# Patient Record
Sex: Male | Born: 1940 | Race: White | Hispanic: No | Marital: Married | Smoking: Current every day smoker
Health system: Southern US, Community
[De-identification: ages and names within clinical notes are randomized; demographics above are authoritative.]

## PROBLEM LIST (undated history)

## (undated) DIAGNOSIS — G459 Transient cerebral ischemic attack, unspecified: Secondary | ICD-10-CM

## (undated) DIAGNOSIS — G709 Myoneural disorder, unspecified: Secondary | ICD-10-CM

## (undated) DIAGNOSIS — K922 Gastrointestinal hemorrhage, unspecified: Secondary | ICD-10-CM

## (undated) DIAGNOSIS — R12 Heartburn: Secondary | ICD-10-CM

## (undated) DIAGNOSIS — F329 Major depressive disorder, single episode, unspecified: Secondary | ICD-10-CM

## (undated) DIAGNOSIS — J45909 Unspecified asthma, uncomplicated: Secondary | ICD-10-CM

## (undated) DIAGNOSIS — R51 Headache: Secondary | ICD-10-CM

## (undated) DIAGNOSIS — R519 Headache, unspecified: Secondary | ICD-10-CM

## (undated) DIAGNOSIS — F32A Depression, unspecified: Secondary | ICD-10-CM

## (undated) DIAGNOSIS — F418 Other specified anxiety disorders: Secondary | ICD-10-CM

## (undated) DIAGNOSIS — K8689 Other specified diseases of pancreas: Secondary | ICD-10-CM

## (undated) DIAGNOSIS — I10 Essential (primary) hypertension: Secondary | ICD-10-CM

## (undated) DIAGNOSIS — J189 Pneumonia, unspecified organism: Secondary | ICD-10-CM

## (undated) DIAGNOSIS — C801 Malignant (primary) neoplasm, unspecified: Secondary | ICD-10-CM

## (undated) DIAGNOSIS — T8859XA Other complications of anesthesia, initial encounter: Secondary | ICD-10-CM

## (undated) DIAGNOSIS — T4145XA Adverse effect of unspecified anesthetic, initial encounter: Secondary | ICD-10-CM

## (undated) DIAGNOSIS — F1010 Alcohol abuse, uncomplicated: Secondary | ICD-10-CM

## (undated) DIAGNOSIS — Z720 Tobacco use: Secondary | ICD-10-CM

## (undated) DIAGNOSIS — K219 Gastro-esophageal reflux disease without esophagitis: Secondary | ICD-10-CM

## (undated) HISTORY — DX: Depression, unspecified: F32.A

## (undated) HISTORY — DX: Major depressive disorder, single episode, unspecified: F32.9

## (undated) HISTORY — PX: COLONOSCOPY: SHX174

## (undated) HISTORY — PX: TONSILLECTOMY: SUR1361

## (undated) HISTORY — PX: CATARACT EXTRACTION: SUR2

## (undated) HISTORY — PX: MULTIPLE TOOTH EXTRACTIONS: SHX2053

## (undated) HISTORY — PX: OTHER SURGICAL HISTORY: SHX169

---

## 1997-08-12 ENCOUNTER — Encounter (HOSPITAL_COMMUNITY): Admission: RE | Admit: 1997-08-12 | Discharge: 1997-11-10 | Payer: Self-pay | Admitting: Psychiatry

## 1997-09-13 ENCOUNTER — Ambulatory Visit (HOSPITAL_COMMUNITY): Admission: RE | Admit: 1997-09-13 | Discharge: 1997-09-13 | Payer: Self-pay | Admitting: Family Medicine

## 1998-02-05 ENCOUNTER — Emergency Department (HOSPITAL_COMMUNITY): Admission: EM | Admit: 1998-02-05 | Discharge: 1998-02-05 | Payer: Self-pay | Admitting: Emergency Medicine

## 1998-02-07 ENCOUNTER — Encounter: Payer: Self-pay | Admitting: Family Medicine

## 1998-02-07 ENCOUNTER — Ambulatory Visit (HOSPITAL_COMMUNITY): Admission: RE | Admit: 1998-02-07 | Discharge: 1998-02-07 | Payer: Self-pay | Admitting: Family Medicine

## 1998-10-11 ENCOUNTER — Ambulatory Visit (HOSPITAL_COMMUNITY): Admission: RE | Admit: 1998-10-11 | Discharge: 1998-10-11 | Payer: Self-pay | Admitting: Gastroenterology

## 1999-01-26 ENCOUNTER — Ambulatory Visit (HOSPITAL_COMMUNITY): Admission: RE | Admit: 1999-01-26 | Discharge: 1999-01-26 | Payer: Self-pay | Admitting: Gastroenterology

## 1999-09-07 ENCOUNTER — Encounter (INDEPENDENT_AMBULATORY_CARE_PROVIDER_SITE_OTHER): Payer: Self-pay | Admitting: *Deleted

## 1999-09-07 ENCOUNTER — Ambulatory Visit (HOSPITAL_COMMUNITY): Admission: RE | Admit: 1999-09-07 | Discharge: 1999-09-07 | Payer: Self-pay | Admitting: Gastroenterology

## 2000-10-22 ENCOUNTER — Encounter (INDEPENDENT_AMBULATORY_CARE_PROVIDER_SITE_OTHER): Payer: Self-pay | Admitting: Specialist

## 2000-10-22 ENCOUNTER — Ambulatory Visit (HOSPITAL_COMMUNITY): Admission: RE | Admit: 2000-10-22 | Discharge: 2000-10-22 | Payer: Self-pay | Admitting: Gastroenterology

## 2001-09-21 ENCOUNTER — Encounter (INDEPENDENT_AMBULATORY_CARE_PROVIDER_SITE_OTHER): Payer: Self-pay | Admitting: Specialist

## 2001-09-21 ENCOUNTER — Ambulatory Visit (HOSPITAL_COMMUNITY): Admission: RE | Admit: 2001-09-21 | Discharge: 2001-09-21 | Payer: Self-pay | Admitting: Gastroenterology

## 2003-05-26 ENCOUNTER — Encounter (INDEPENDENT_AMBULATORY_CARE_PROVIDER_SITE_OTHER): Payer: Self-pay | Admitting: *Deleted

## 2003-05-26 ENCOUNTER — Ambulatory Visit (HOSPITAL_COMMUNITY): Admission: RE | Admit: 2003-05-26 | Discharge: 2003-05-26 | Payer: Self-pay | Admitting: Gastroenterology

## 2004-12-21 ENCOUNTER — Ambulatory Visit: Payer: Self-pay | Admitting: Family Medicine

## 2004-12-25 ENCOUNTER — Ambulatory Visit: Payer: Self-pay | Admitting: *Deleted

## 2004-12-27 ENCOUNTER — Ambulatory Visit: Payer: Self-pay | Admitting: Family Medicine

## 2005-03-26 ENCOUNTER — Ambulatory Visit: Payer: Self-pay | Admitting: Family Medicine

## 2005-05-24 ENCOUNTER — Ambulatory Visit: Payer: Self-pay | Admitting: Family Medicine

## 2005-06-24 ENCOUNTER — Ambulatory Visit: Payer: Self-pay | Admitting: Family Medicine

## 2005-07-24 ENCOUNTER — Ambulatory Visit: Payer: Self-pay | Admitting: Family Medicine

## 2008-01-09 ENCOUNTER — Observation Stay (HOSPITAL_COMMUNITY): Admission: EM | Admit: 2008-01-09 | Discharge: 2008-01-11 | Payer: Self-pay | Admitting: Emergency Medicine

## 2008-01-10 ENCOUNTER — Encounter (INDEPENDENT_AMBULATORY_CARE_PROVIDER_SITE_OTHER): Payer: Self-pay | Admitting: Internal Medicine

## 2008-01-10 ENCOUNTER — Ambulatory Visit: Payer: Self-pay | Admitting: Surgery

## 2008-03-03 ENCOUNTER — Inpatient Hospital Stay (HOSPITAL_COMMUNITY): Admission: EM | Admit: 2008-03-03 | Discharge: 2008-03-06 | Payer: Self-pay | Admitting: Emergency Medicine

## 2008-03-03 ENCOUNTER — Ambulatory Visit: Payer: Self-pay | Admitting: Internal Medicine

## 2008-03-04 ENCOUNTER — Encounter (INDEPENDENT_AMBULATORY_CARE_PROVIDER_SITE_OTHER): Payer: Self-pay | Admitting: Internal Medicine

## 2008-03-04 ENCOUNTER — Ambulatory Visit: Payer: Self-pay | Admitting: Vascular Surgery

## 2008-04-02 ENCOUNTER — Inpatient Hospital Stay (HOSPITAL_COMMUNITY): Admission: EM | Admit: 2008-04-02 | Discharge: 2008-04-07 | Payer: Self-pay | Admitting: Emergency Medicine

## 2008-05-20 ENCOUNTER — Encounter: Admission: RE | Admit: 2008-05-20 | Discharge: 2008-05-20 | Payer: Self-pay | Admitting: Family Medicine

## 2008-11-21 ENCOUNTER — Inpatient Hospital Stay (HOSPITAL_COMMUNITY): Admission: EM | Admit: 2008-11-21 | Discharge: 2008-11-22 | Payer: Self-pay | Admitting: Emergency Medicine

## 2009-02-11 ENCOUNTER — Emergency Department (HOSPITAL_COMMUNITY): Admission: EM | Admit: 2009-02-11 | Discharge: 2009-02-12 | Payer: Self-pay | Admitting: Emergency Medicine

## 2009-02-12 ENCOUNTER — Inpatient Hospital Stay (HOSPITAL_COMMUNITY): Admission: RE | Admit: 2009-02-12 | Discharge: 2009-02-17 | Payer: Self-pay | Admitting: Psychiatry

## 2009-02-12 ENCOUNTER — Ambulatory Visit: Payer: Self-pay | Admitting: Psychiatry

## 2010-02-28 ENCOUNTER — Encounter: Admission: RE | Admit: 2010-02-28 | Discharge: 2010-02-28 | Payer: Self-pay | Admitting: Neurology

## 2010-08-16 LAB — URINALYSIS, ROUTINE W REFLEX MICROSCOPIC
Bilirubin Urine: NEGATIVE
Glucose, UA: NEGATIVE mg/dL
Hgb urine dipstick: NEGATIVE
Ketones, ur: NEGATIVE mg/dL
Protein, ur: NEGATIVE mg/dL
Urobilinogen, UA: 0.2 mg/dL (ref 0.0–1.0)
pH: 6 (ref 5.0–8.0)

## 2010-08-16 LAB — RAPID URINE DRUG SCREEN, HOSP PERFORMED
Amphetamines: NOT DETECTED
Barbiturates: NOT DETECTED
Benzodiazepines: POSITIVE — AB
Cocaine: NOT DETECTED
Opiates: NOT DETECTED
Tetrahydrocannabinol: NOT DETECTED

## 2010-08-16 LAB — DIFFERENTIAL
Basophils Absolute: 0.1 10*3/uL (ref 0.0–0.1)
Basophils Relative: 1 % (ref 0–1)
Lymphocytes Relative: 11 % — ABNORMAL LOW (ref 12–46)
Monocytes Absolute: 0.9 10*3/uL (ref 0.1–1.0)
Monocytes Relative: 6 % (ref 3–12)
Neutro Abs: 12 10*3/uL — ABNORMAL HIGH (ref 1.7–7.7)

## 2010-08-16 LAB — CBC
MCV: 94.3 fL (ref 78.0–100.0)
Platelets: 280 10*3/uL (ref 150–400)
RDW: 14.1 % (ref 11.5–15.5)

## 2010-08-16 LAB — BASIC METABOLIC PANEL
Calcium: 9.1 mg/dL (ref 8.4–10.5)
Chloride: 104 mEq/L (ref 96–112)
GFR calc Af Amer: >60 mL/min (ref >60)

## 2010-08-19 LAB — POCT I-STAT 3, ART BLOOD GAS (G3+)
Bicarbonate: 25.5 mEq/L — ABNORMAL HIGH (ref 20.0–24.0)
pCO2 arterial: 42.7 mmHg (ref 35.0–45.0)
pH, Arterial: 7.38 (ref 7.350–7.450)
pO2, Arterial: 84 mmHg (ref 80.0–100.0)

## 2010-08-19 LAB — DIFFERENTIAL
Basophils Relative: 1 % (ref 0–1)
Eosinophils Absolute: 0.2 10*3/uL (ref 0.0–0.7)
Eosinophils Absolute: 0.3 10*3/uL (ref 0.0–0.7)
Eosinophils Relative: 3 % (ref 0–5)
Eosinophils Relative: 3 % (ref 0–5)
Lymphocytes Relative: 23 % (ref 12–46)
Lymphocytes Relative: 24 % (ref 12–46)
Lymphs Abs: 1.8 10*3/uL (ref 0.7–4.0)
Monocytes Absolute: 0.7 10*3/uL (ref 0.1–1.0)
Monocytes Relative: 8 % (ref 3–12)
Monocytes Relative: 9 % (ref 3–12)
Neutro Abs: 5.9 10*3/uL (ref 1.7–7.7)

## 2010-08-19 LAB — CBC
HCT: 37 % — ABNORMAL LOW (ref 39.0–52.0)
HCT: 42.8 % (ref 39.0–52.0)
MCHC: 34 g/dL (ref 30.0–36.0)
MCV: 94.3 fL (ref 78.0–100.0)
MCV: 94.7 fL (ref 78.0–100.0)
Platelets: 181 10*3/uL (ref 150–400)
RBC: 3.91 MIL/uL — ABNORMAL LOW (ref 4.22–5.81)
RBC: 4.54 MIL/uL (ref 4.22–5.81)
WBC: 7.3 10*3/uL (ref 4.0–10.5)
WBC: 8.9 10*3/uL (ref 4.0–10.5)

## 2010-08-19 LAB — COMPREHENSIVE METABOLIC PANEL
AST: 12 U/L (ref 0–37)
AST: 14 U/L (ref 0–37)
BUN: 21 mg/dL (ref 6–23)
BUN: 22 mg/dL (ref 6–23)
CO2: 27 mEq/L (ref 19–32)
Calcium: 8.5 mg/dL (ref 8.4–10.5)
Chloride: 106 mEq/L (ref 96–112)
Chloride: 106 mEq/L (ref 96–112)
Creatinine, Ser: 1.08 mg/dL (ref 0.4–1.5)
Creatinine, Ser: 1.09 mg/dL (ref 0.4–1.5)
GFR calc Af Amer: >60 mL/min (ref >60)
GFR calc Af Amer: >60 mL/min (ref >60)
GFR calc non Af Amer: >60 mL/min (ref >60)
GFR calc non Af Amer: >60 mL/min (ref >60)
Glucose, Bld: 80 mg/dL (ref 70–99)
Potassium: 3.7 mEq/L (ref 3.5–5.1)
Total Bilirubin: 0.6 mg/dL (ref 0.3–1.2)

## 2010-08-19 LAB — URINALYSIS, ROUTINE W REFLEX MICROSCOPIC
Glucose, UA: NEGATIVE mg/dL
Nitrite: NEGATIVE
Specific Gravity, Urine: 1.027 (ref 1.005–1.030)
pH: 5 (ref 5.0–8.0)

## 2010-08-19 LAB — SALICYLATE LEVEL: Salicylate Lvl: <4 mg/dL (ref 2.8–20.0)

## 2010-08-19 LAB — RAPID URINE DRUG SCREEN, HOSP PERFORMED: Cocaine: NOT DETECTED

## 2010-08-19 LAB — LIPID PANEL
HDL: 34 mg/dL — ABNORMAL LOW (ref >39)
Total CHOL/HDL Ratio: 3.4 RATIO
Triglycerides: 108 mg/dL (ref <150)
VLDL: 22 mg/dL (ref 0–40)

## 2010-09-25 NOTE — H&P (Signed)
NAME:  Reginald Ochoa, Reginald Ochoa NO.:  [id number]   MEDICAL RECORD NO.:  [id number]          PATIENT TYPE:  INP   LOCATION:  4705                         FACILITY:  [facility]   PHYSICIAN:  Oswald Hillock, MD        DATE OF BIRTH:  [birth year]   DATE OF ADMISSION:  11/21/2008  DATE OF DISCHARGE:                              HISTORY & PHYSICAL   CHIEF COMPLAINT:  Altered mental status.   HISTORY OF PRESENT ILLNESS:  The patient is a 71 year old Caucasian male  with history of CVA and residual visual deficits who presents to the  emergency room with complaints of altered mental status.  Apparently,  family noticed him to be altered this afternoon.  He was on the floor,  had slurring of speech, and clapped several times.  Apparently, the  patient has been taking hydrocodone or OxyContin, not prescribed to him  and provided to him by one of his friends.  The patient's family states  that he has been more somnolent than usual over the last few days.  No  reports of any recent cough, fever, rigors, chills, chest pain,  shortness of breath, palpitations, loss of consciousness, or any focal  weakness of any part of the body.  No abdominal pain, diarrhea, or  urinary symptoms reported either.   PAST MEDICAL HISTORY:  Significant for:  1. CVA with residual visual deficits.  2. History of acute renal failure.  3. History of hypertension.  4. History of hypokalemia.  5. History of rhabdomyolysis.  6. History of peptic ulcer disease.  7. History of ADHD.   SOCIAL HISTORY:  The patient lives with family.  He has a history of  heavy alcohol abuse according to previous discharge summaries.  The  patient continues to drink though not that frequently and smokes  cigarettes as well.  He does need help with some of his ADLs, but family  reports that he continues to maintain high level of functioning  including writing though he does get easily frustrated because of his  visual deficits.   FAMILY HISTORY:  No hypertension, coronary artery disease, or diabetes  mellitus in the family.   ALLERGIES:  No known drug allergies.   CURRENT MEDICATIONS:  Per the discharge summary of April 06, 2008, he  should be on:  1. Aggrenox 1 tablet p.o. b.i.d.  2. Adderall 10 mg daily.  3. Lisinopril 20 mg daily.  4. Toprol-XL 25 mg daily.  5. Prilosec daily.  Family reports that he is currently taking Xanax and Prozac as well,  dosage unknown.   REVIEW OF SYSTEMS:  An extensive review of systems was done.  All  systems are negative except for the positives mentioned in the history  of present illness.   PHYSICAL EXAMINATION:  VITAL SIGNS:  On admission, pulse 101, blood  pressure 117/79, respiratory rate of 14, and temperature 97.1.  GENERAL:  The patient is conscious, alert and oriented to person, place,  and time, but does get confused when asked specific questions.  He does  have some slurring of speech but on examination he does  have a very dry  tongue.  He has right eye strabismus with significant visual deficits in  both eyes.  HEENT:  No scleral icterus.  No pallor.  Ears negative.  Mucous  membranes are dry.  No significant oropharyngeal lesions, otherwise.  NECK:  Supple.  No lymphadenopathy.  No JVD.  No carotid bruit.  CHEST:  Breath sounds heard bilaterally.  Fair air entry.  Occasional  rhonchi.  CVS: S1 and S2 plus regular.  No gallop or rub.  ABDOMEN:  Soft and nontender.  Bowel sounds are present.  EXTREMITIES:  No cyanosis, clubbing, or edema.  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  No other  cranial nerve palsy noted on gross examination.  Motor examination;  power grade 5/5 all over.  Reflexes are present.  Plantars are  bilaterally downgoing.  No sensory deficits noted.  Gait; the patient  does have an unsteady gait and difficulty performing finger-nose and  heel-to-shin testing, more likely because of his visual deficits than  any  possible cerebellar deficits.   LABORATORY DATA:  1. His EKG showed normal sinus rhythm, normal PR interval, right axis      deviation.  No significant changes compared with previous EKGs.  2. ABG showed a pH of 7.38, PCO2 of 42.7, and PO2 of 84.  3. CT head showed no acute intracranial abnormality and stable      bilateral occipital pole encephalomalacia worse on the right,      stable grey matter differentiation throughout the brain.  Urine      drug screen was positive for opiates, benzodiazepines, and      amphetamines.  Salicylate was less than 4.  Sodium 138, potassium      3.7, chloride 106, CO2 of 27, glucose 80, BUN 21, creatinine 1.08,      and calcium of 8.6.  Tylenol level was less than 10.  Urinalysis      was negative.  4. His WBC count was 7.3, hemoglobin 14.5, hematocrit 42.8, and      platelet count of 208.   IMPRESSION AND PLAN:  This is a case of a 70 year old Caucasian male  with history of multiple comorbidities including cerebrovascular  accident, hypertension, and attention deficit hyperactivity disorder who  presents with altered mental status.  1. Altered mental status.  Etiology essentially unclear though it is      very likely his narcotic use.  As the patient has significant      deficits in his vision, it is very difficult for him to take the      exact dosage of the medication.  As family suspects, he may have      been taking multiple doses of OxyContin/hydrocodone that is      provided to him by his friend as also the Xanax medication that he      takes for anxiety.  Initial eval including blood gas have been      within normal limits.  No evidence of any infectious process with a      normal white count and clear chest x-ray and urinalysis.  We will      admit him to the telemetry floor for overnight observation and      monitor neuro status closely.  If there are any changes or no      improvement in the next 12-24 hours, we may need to do an MRI to       evaluate him further though  as mentioned above his CT scan is      essentially negative.  He did have a similar episode about 7 months      back and was noted to have pneumonia at that time.  2. Hypertension.  Continue current medications.  3. History of cerebrovascular accident.  We will continue Aggrenox      twice daily.  4. History of peptic ulcer disease.  We will start him on Protonix      daily.  5. Attention deficit hyperactivity disorder.  Continue Adderall 10 mg      daily.  6. History of alcohol abuse with his last drink more than 24 hours      back.  We will put him on Ativan for delirium tremens prophylaxis.      Start thiamine and folate.  7. Unsteady gait, though this has been a chronic issue since his      stroke.  We will get PT/OT eval and follow up with their      recommendations.  8. Deep vein thrombosis/seizure prophylaxis.  Protonix and subcu      heparin.      Oswald Hillock, MD  Electronically Signed     BA/MEDQ  D:  11/21/2008  T:  11/22/2008  Job:  [id number]   cc:   Renaye Rakers, M.D.
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
## 2010-09-25 NOTE — H&P (Signed)
NAME:  Reginald Ochoa, NETTLE NO.:  [id number]   MEDICAL RECORD NO.:  [id number]          PATIENT TYPE:  INP   LOCATION:  5528                         FACILITY:  [facility]   PHYSICIAN:  Joylene John, MD       DATE OF BIRTH:  [birth year]   DATE OF ADMISSION:  04/02/2008  DATE OF DISCHARGE:                              HISTORY & PHYSICAL   REASON FOR ADMISSION:  Confusion and altered mental status.   HISTORY OF PRESENT ILLNESS:  This is a 70 year old white male who was  recently admitted in October for stroke.  The patient was found to have  altered mental status, problems with his speech, and was found to be  stumbling around.  The patient's family thought that he was having a TIA  given the fact that he was recently in the hospital for a stroke.  The  patient was brought into the ER for further evaluation and was found to  have a pneumonia on chest x-ray.  The patient tells me that overall he  feels fine.  He denies any nausea, vomiting, fevers, chills, shortness  of breath, or URI symptoms recently.  The patient denies any sick  contacts.  The patient tells me his last alcoholic drink was 36 hours  ago, which was a gin and tonic one drink only according to the patient.   PAST MEDICAL HISTORY:  Significant for stroke and cerebrovascular  accidents, several of them in the past.  Acute renal failure with severe  hypertension, which is now resolved, rhabdomyolysis, hypokalemia, and  peptic ulcer disease.   SOCIAL HISTORY:  The patient lives with the family.  He has history of  heavy alcohol abuse according to the discharge summary that was done in  October.  He had quit 8 years ago.  However, according to the patient,  his last drink was 36 hours ago, also history of ADHD and tobacco abuse.   FAMILY HISTORY:  Noncontributory.   ALLERGIES:  No known allergies.   CURRENT HOME MEDICATIONS:  Per the ED chart as Adderall, Xanax, and  hydrocodone with Tylenol.  However, per the  discharge summary, the  patient was on few other medications as well which included blood  pressure medications and Aggrenox.  The patient is not aware about these  medications.   REVIEW OF SYSTEMS:  Please refer to the HPI, otherwise the 14-point  review of system was negative.   PHYSICAL EXAMINATION:  VITAL SIGNS:  Temperature of 103.1 on admission  originally, which decreased with treatment to 99.8; blood pressure was  originally 135/76, later re-measured was 103/49; pulse 120 in the  beginning, dropped down to 88; respirations 23, dropped down to 16; and  the patient was sating 90% on oxygen, after treatment oxygenation  improved to 95%.  GENERAL:  This is an elderly white male, who appears older than his  chronological age, disheveled.  The patient is confused, however, he is  able to follow simple commands and answer to questions somewhat  appropriately.  HEENT:  No icterus or pallor noted on exam.  NECK:  No JVD appreciated.  HEART:  The patient is tachycardic on exam.  No murmurs appreciated.  LUNGS:  The patient has decreased breath sounds, right lung base with  crackles and egophony.  LOWER EXTREMITIES:  No edema appreciated.  Neuro exam: limited since patient is delirous however over all non  focal, pt moving all extremities   PERTINENT LABORATORIES:  White count of 18.7, hemoglobin 12.4, crit  36.7, and platelets 186.  Sodium 140, potassium 4.6, chloride 108,  bicarb 25, BUN 30, creatinine 1.46, and glucose 114.  AST 72, ALT 22,  albumin 3.1, and calcium 8.3.  Chest x-ray shows extensive right lower  lobe pneumonia.  The patient received ceftriaxone and azithromycin in  the ER.   ASSESSMENT AND PLAN:  To admit the patient to a regular bed.  We will  give IV antibiotics, which will include levofloxacin.  We will dose  medication appropriately based on his reduced renal function.  We will  continue his Aggrenox, Adderall, and Protonix.  We will hold his blood  pressure  medications.  We will give oxygen p.r.n. for maintaining O2 sat  more than 90%.  We will also give IV fluids for about 24 hours.  We will  do urine culture since the UA was dirty.  We will repeat labs in the  morning.  The patient needs to be monitored for delirium tremens since  his last alcohol drink was 36 hours ago, even though he tells me he had  only one drink.  The patient will also be placed on DVT prophylaxis in  the form of subcu heparin.      Joylene John, MD  Electronically Signed     RP/MEDQ  D:  04/02/2008  T:  04/03/2008  Job:  [id number]

## 2010-09-25 NOTE — Discharge Summary (Signed)
NAME:  Reginald Ochoa, Reginald Ochoa NO.:  [id number]   MEDICAL RECORD NO.:  [id number]          PATIENT TYPE:  OBV   LOCATION:  3003                         FACILITY:  [facility]   PHYSICIAN:  Theodosia Paling, MD    DATE OF BIRTH:  1941-03-01   DATE OF ADMISSION:  01/08/2008  DATE OF DISCHARGE:  01/11/2008                               DISCHARGE SUMMARY   ADMITTING HISTORY:  Please refer to the admission note dictated by Dr.  Vania Rea on January 09, 2008, under history of present illness.   PRIMARY CARE PHYSICIAN:  Dr. Renaye Rakers.   GASTROENTEROLOGIST:  Dr. Bernette Redbird.   DISCHARGE DIAGNOSES:  1. Acute stroke.  2. Hypertension.  3. Peripheral vascular disease.  4. Attention deficit disorder.   DISCHARGE MEDICATIONS:  1. Aspirin enteric coated 325 mg p.o. daily.  2. Lisinopril 20 mg p.o. daily.  3. Adderall 20 mg p.o. daily.  4. Simvastatin 20 mg p.o. daily.  5. Ativan 1 mg p.o. q.6 h. p.r.n.  6. Metoprolol-XL 25 mg p.o. daily.   HOSPITAL COURSE:  Following issues were addressed during the  hospitalization.  1. Acute stroke.  The patient had evidence of subacute stroke of the      right thalamus and MRI suggested left posterior cerebral artery      occlusion.  Echocardiogram, telemetry, and carotid duplex were      negative.  The patient did not have any focal neurological deficit      and no numbness.  The patient's mentation:  Mental status changes      which was associated with presyncopal episodes resolved throughout      the hospitalization.  At the time of discharge, cranial nerve      examination was intact except for seventh lower motor neuron      paralysis on the left side, which was old.  The patient was      discharged on aspirin and statin.  2. Peptic ulcer disease, PPI continued.  3. Asthma, p.r.n. albuterol was requested.  4. ADD.  Advil was continued.  5. Hypertension.  Lisinopril was continued.   CONSULTATIONS PERFORMED:  None.   PROCEDURE PERFORMED:  None.   IMAGING PERFORMED:  MRI and MRA of the brain showing subacute stroke  involving the right thalamus, remote left occipital stroke .   TOTAL TIME SPENT IN DISCHARGE:  35 minutes.   DISPOSITION:  The patient will follow up with the primary care physician  within 1 week's time.   ADDENDUM:  Due to the intracranial nature of the occlusion, no  intervention could be performed.  The vascular lesion was left alone.      Theodosia Paling, MD  Electronically Signed     NP/MEDQ  D:  03/03/2008  T:  03/04/2008  Job:  [id number]   cc:   Renaye Rakers, M.D.
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
## 2010-09-25 NOTE — Discharge Summary (Signed)
NAME:  Reginald Ochoa, Reginald Ochoa NO.:  [id number]   MEDICAL RECORD NO.:  [id number]          PATIENT TYPE:  INP   LOCATION:  4705                         FACILITY:  [facility]   PHYSICIAN:  Eduard Clos, MDDATE OF BIRTH:  Sep 11, 1940   DATE OF ADMISSION:  11/21/2008  DATE OF DISCHARGE:  11/22/2008                               DISCHARGE SUMMARY   COURSE IN THE HOSPITAL:  A 70 year old male with known history of CVA  with residual visual defects, hypertension who was brought in to the ER.  The patient had a brief spell of confusion noticed by his family.  There  was no notice of any seizure-like activity.  Patient was taking pain  medication but at this time patient is completely oriented.  He states  that he took only his regular dose of medication.  Patient was admitted  for observation and on admission patient had a CAT scan of the head  which did not show anything acute.  Patient was observed on telemetry.  There were no arrhythmias.  Patient's drug screen was positive for  amphetamines, benzos, and opiates.  LFTs were normal.  At this time, as  patient has completely become well oriented, patient also ambulated with  the help of OT who was recommending home health OT, PT, and social work  consult for his blindness.  I discussed with patient's family in detail  that he would need an outpatient followup with a neurologist and we will  discharge home with family with outpatient followup with neurologist for  which I recommend the contact number and patient also has been on  spironolactone which I told him to hold it until patient sees Dr. Renaye Rakers again.  At the time of this dictation, patient's hemoglobin was  stable.   PROCEDURES DONE DURING THE STAY:  1. Chest x-ray, nothing acute.  2. CT of the head, no acute intracranial abnormality.   PERTINENT LABS:  Hemoglobin at discharge was 12.6.  Ferritin was 1.  LDL  was 60.  UA was negative.   ASSESSMENT:  1.  Altered mental status, probably related to medication.  2. History of cerebrovascular accident with residual gait disorder and      visual defect.  3. Hypertension.  4. 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  5. Anxiety disorder.   MEDICATIONS AT DISCHARGE:  1. Adderall 10 mg p.o. daily.  2. Xanax 0.5 mg p.o. in the morning and 1 mg at bedtime.  3. Hydrocodone/acetaminophen 10/650 mg p.o. daily p.r.n.  4. Aggrenox 1 capsule p.o. b.i.d.  5. Prilosec 20 mg p.o. daily.  6. Azor 10/40 mg p.o. daily.  7. Spironolactone 25 mg p.o. b.i.d.   PLAN:  Patient will be discharged home with OT, PT, home health R.N.,  and social work.  He will follow up with regard to his blindness.  To  follow up with [facility], I have given contact number.  Patient's family to  contact and make an appointment.  To recheck CBC and CMET with primary  care physician within 1 week's time.  To be on a cardiac-healthy diet  and activity as tolerated with fall precaution.  To hold spironolactone  until seen by Dr. Renaye Rakers.      Eduard Clos, MD  Electronically Signed     ANK/MEDQ  D:  11/22/2008  T:  11/22/2008  Job:  [id number]   cc:   Renaye Rakers, M.D.

## 2010-09-25 NOTE — Discharge Summary (Signed)
NAME:  Ochoa, Reginald NO.:  [id number]   MEDICAL RECORD NO.:  [id number]          PATIENT TYPE:  INP   LOCATION:  5528                         FACILITY:  [facility]   PHYSICIAN:  Lonia Blood, M.D.DATE OF BIRTH:  June 29, 1940   DATE OF ADMISSION:  04/02/2008  DATE OF DISCHARGE:  04/07/2008                               DISCHARGE SUMMARY   PRIMARY CARE PHYSICIAN:  Renaye Rakers, M.D.   PSYCHIATRIST:  Dr. Orvan Falconer.   DISCHARGE DIAGNOSES:  1. Altered mental status/toxic metabolic encephalopathy.      a.     Felt to be secondary to severe pneumonia.      b.     Complicating factor of possible alcohol use.      c.     Symptoms essentially resolved at discharge.  2. Severe diffuse right lung community-acquired pneumonia - responding      to empiric antibiotic therapy.  3. Right thalamic cerebrovascular accident with concomitant left brain      vascular occlusion October 2009 - ongoing Aggrenox therapy.  4. Hypertension.  5. 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  6. History of peptic ulcer disease.  7. History of Barrett's esophagus.  8. Attention deficit hyperactivity disorder   DISCHARGE MEDICATIONS:  1. Levaquin 750 mg p.o. daily for 7 days, then stop.  2. Albuterol MDI 2 puffs every 4-6 hours p.r.n. shortness of breath.  3. Aggrenox 1 tablet p.o. b.i.d.  4. Adderall 10 mg p.o. daily - refills per psychiatrist at her      discretion.  5. Lisinopril 20 mg p.o. daily.  6. Toprol XL 25 mg p.o. daily.  7. Tussionex 5 mL every 12 hours p.r.n. cough.  8. Prilosec OTC 20 mg p.o. daily.   DISCHARGE INSTRUCTIONS:  The patient is advised to call Dr. Parke Simmers for a  follow up appointment in 5-7 days.   PROCEDURE:  None.   CONSULTATIONS:  None.   HOSPITAL COURSE:  [name] is a pleasant 70 year old  gentleman with a complex medical history.  He presented to the hospital  on April 02, 2008 with complaints  of severe  confusion/altered mental status.  The patient was found to be extremely  weak and stumbling around.  The patient's family was concerned about the  possibility of TIA and brought him to the emergency room for evaluation.  In the emergency room, physical exam was not consistent with focal  neurologic deficit.  A chest x-ray revealed severe extensive right lung  pneumonia.  The O2 sats were in the upper 80 range on room air.  The  patient was admitted to the acute units.  He was treated with  intravenous broad-spectrum empiric antibiotics for community-acquired  pneumonia.  The patient improved nicely.  Episodes were encountered  during the hospitalization of fluctuating mentation.  It was questioned  as to whether or not the patient has an underlying mental health  disorder  in addition to his apparent previously diagnosed ADHD.  The  patient certainly exhibited tangential and pressured speech as well as  apparent delusions  of grandeur intermittently.  Nonetheless, the patient  did demonstrate intact reasoning, intact memory and higher level problem  solving abilities.  As a result, inpatient hospitalization to further  evaluate this was not felt to be necessary.  In that the patient's  community-acquired pneumonia continued to improve, he was felt to be  stable for discharge.  The plan was for the patient to be discharged on  April 06, 2008, but hypokalemia was appreciated on that day at 2.9.  The patient is being repleted orally.  A magnesium level is being  checked now.  We will recheck a potassium level in the morning.  The  patient's hyperkalemia is felt to be due to decreased intake during his  hospital stay related to his illness.  Assuming the patient's potassium  is normalized in the morning, he should be stable for discharge.   FOLLOW UP:  1. He is advised to follow up with his psychiatrist in the outpatient      setting for ongoing evaluation.  2. He is advised to  follow up with his primary care physician as      detailed above for routine followup of his pneumonia within a      week's time.   Otherwise, no significant changes were made to his medication regimen  and the patient is discharged in stable condition with stable vitals.      Lonia Blood, M.D.  Electronically Signed     JTM/MEDQ  D:  04/06/2008  T:  04/06/2008  Job:  [id number]   cc:   Renaye Rakers, M.D.  Orvan Falconer, MD

## 2010-09-28 NOTE — Procedures (Signed)
[name]. [facility]  Patient:    Reginald Ochoa, Reginald Ochoa                    MRN: [id number] Proc. Date: 09/07/99 Adm. Date:  [id number] Attending:  Rich Brave CC:         Geraldo Pitter, M.D.                           Procedure Report  PROCEDURE:  Upper endoscopy with biopsies.  INDICATIONS:  A 70 year old with Barretts esophagus.  FINDINGS:  A 5-cm segment of Barretts esophagus.  DESCRIPTION OF PROCEDURE:  The nature, purpose, and risks of the procedure were familiar to the patient from prior examinations, and he provided written consent.  Sedation was Fentanyl 50 mcg and Versed 5 mg IV without arrhythmias or desaturation.  The Olympus video endoscope was passed under direct vision without difficulty.  The larynx looked grossly normal.  The esophagus was entered without undue difficulty.  The proximal esophagus was normal.  The distal 5 cm of the esophagus had circumferential Barretts esophagus with some tongues extending more proximally.  No significant hiatal hernia was present although there was probably a small, 1 to 2-cm hiatal hernia.  There was no evidence of any esophageal mass affect nor any evidence of varices or infection.  The stomach was entered.  It contained no significant residual and had normal mucosa without evidence of gastritis, erosions, ulcers, polyps, or masses.  The pylorus, duodenal bulb, and second duodenum looked normal, and specifically, there was no evidence of recurrent peptic ulcer disease in this patient with a past history of an end-stage-related perforated ulcer.  Multiple biopsies (approximately 15) were obtained from the Barretts segment of the esophagus prior to removal of the scope.  The patient tolerated the procedure well with no apparent complications.  IMPRESSION:  A 5-cm segment of Barretts esophagus with pathology pending.  PLAN:  Await pathology.  Anticipate probable surveillance endoscopy in  about two years. DD:  09/07/99 TD:  09/08/99 Job: [id number]

## 2010-09-28 NOTE — Op Note (Signed)
NAME:  Reginald Ochoa, MORTELLARO NO.:  [id number]   MEDICAL RECORD NO.:  [id number]                   PATIENT TYPE:  AMB   LOCATION:  ENDO                                 FACILITY:  [facility]   PHYSICIAN:  Bernette Redbird, M.D.                DATE OF BIRTH:  11-21-1940   DATE OF PROCEDURE:  05/26/2003  DATE OF DISCHARGE:                                 OPERATIVE REPORT   PROCEDURE:  Upper endoscopy with biopsies.   INDICATIONS:  A 70 year old with Barrett's esophagus with most recent  biopsies about 1-1/2 years ago showing indeterminate changes for low grade  dysplasia.   FINDINGS:  5-cm segment of Barrett's, biopsied extensively.   PROCEDURE:  The nature, purpose and risk of the procedure were familiar to  the patient from prior examination and he provided written consent.  Sedation utilized Phenergan 12.5 mg IV as premedication to augment the  sedative effects in this patient who has a very high medication requirement.  Additionally, he received Fentanyl 75 mcg and Versed 10 mg IV without  arrhythmias or desaturation, but with hypertension (170/110).   The Olympus video endoscope was passed under direct vision.  The vocal cords  were not well seen, but no gross laryngeal abnormalities were appreciated.  The esophagus was entered without significant difficulty and the esophagus  had normal mucosa in its proximal portion with classic changes of Barrett's,  tongues of erythematous mucosa with distal circumferential Barrett's  appearing mucosa, for an overall segment length of approximately 5 cm or so  by my estimate.  Approximately 15 or 20 biopsies were obtained from the  Barrett's segment at the conclusion of the procedure.  The pyloris was  widely patent and the proximal small bowel looked normal.   Retroflex viewing showed a normal appearing cardia and fundus without any  significant hiatal hernia.   The patient tolerated the procedure well and there were no  apparent  complications.   IMPRESSION:  Medium segment Barrett's esophagus, biopsied extensively  (530.85).   PLAN:  Await pathology results.                                               Bernette Redbird, M.D.    RB/MEDQ  D:  05/26/2003  T:  05/26/2003  Job:  [id number]   cc:   Renaye Rakers, M.D.  [phone] [address], [address]  [place]  [place] [address]  Fax: [phone]

## 2010-09-28 NOTE — Procedures (Signed)
[place]. [facility]  Patient:    Reginald Ochoa, Reginald Ochoa                    MRN: [id number] Proc. Date: 10/22/00 Adm. Date:  [id number] Attending:  Rich Brave CC:         Geraldo Pitter, M.D.  Mardene Celeste Lurene Shadow, M.D.   Procedure Report  PROCEDURE PERFORMED:  Upper endoscopy with biopsies.  ENDOSCOPIST:  Florencia Reasons, M.D.  INDICATIONS FOR PROCEDURE:  The patient is a 70 year old with fairly extensive Barretts esophagus.  Most recent biopsies a year ago did not show any dysplasia.  FINDINGS:  A 5 cm segment of Barretts esophagus without gross mass effect or other morphologic abnormalities.  Small hiatal hernia.  Minimal hemorrhagic gastritis.  DESCRIPTION OF PROCEDURE:  The nature, purpose and risks of the procedure had been discussed with the patient, who provided written consent.  Sedation was fentanyl 100 mcg and Versed 10 mg IV without arrhythmias or desaturation. The Olympus GIF160 small caliber adult video endoscope was passed under direct vision.  The laryngeal cartilage looked a little bit atrophic but I did not see overt inflammatory changes or any neoplastic abnormalities.  The esophagus was readily entered.  The proximal esophagus was normal, whereas the distal esophagus had extensive Barretts esophagus beginning at about 31 cm from the mouth, and in the distalmost portion of the esophagus, actually circumferential in character. No mass effect was evident.  There was no evidence of any ring or stricture in the esophagus.  It was hard to define the exact site of the lower esophageal sphincter but the diaphragmatic hiatus appeared to be at about 41 cm.  The stomach was entered.  There were a few mucosal hemorrhages on the greater curve of the stomach in the midbody, but no erosions, ulcers, polyps or masses including a retroflex view of the cardia of the stomach.  The pylorus, duodenal bulb and second duodenum was normal,  keeping in mind that the patient is status post a previous ulcer oversewing for a perforated ulcer.  Multiple (approximately 15) biopsies were then obtained from the Barretts segment of the esophagus, which was free of any active inflammatory changes.  The patient tolerated the procedure well and there were no apparent complications.  IMPRESSION: 1. Extensive Barretts esophagus. 2. Small hiatal hernia. 3. Minimal hemorrhagic gastritis but apparent adequate protection of the    gastric mucosa from the Prevacid the patient takes despite daily use    of Indocin three times a day.  PLAN:  Await pathology.  Follow-up endoscopy in one to two years. DD:  10/22/00 TD:  10/22/00 Job: [id number]

## 2010-09-28 NOTE — Procedures (Signed)
[place]. [facility]  Patient:    Reginald Ochoa, Reginald Ochoa Visit Number: [id number] MRN: [id number]          Service Type: END Location: ENDO Attending Physician:  Rich Brave Dictated by:   Florencia Reasons, M.D. Proc. Date: 09/21/01 Admit Date:  09/21/2001   CC:         Geraldo Pitter, M.D.   Procedure Report  PROCEDURE PERFORMED:  Upper endoscopy with biopsies.  ENDOSCOPIST:  Florencia Reasons, M.D.  INDICATIONS FOR PROCEDURE:  Follow-up of Barretts esophagus, with evidence of low-grade dysplasia on biopsies obtained a year ago.  FINDINGS:  A 5 cm segment of Barretts.  A 2 cm hiatal hernia.  Moderate gastric residual.  DESCRIPTION OF PROCEDURE:  The nature, purpose and risks of the procedure were familiar to the patient who provided written consent.  Sedation prior to and during the course of the procedure totalled fentanyl 125 mcg and Versed  13 mg IV without deep sedation and without any arrhythmias or desaturation.  His medication tolerance is attributed to outpatient use of narcotics for chronic migraines as well as a previous history of alcoholism.  The Olympus small caliber adult video endoscope was passed readily under direct vision.  The vocal cords looked normal.  There was a slight hang up at the cricopharyngeus but this resolved with the scope was advanced in coordination with the swallow.  The proximal esophagus was normal but beginning at 35 cm there were tongues of Barretts mucosa which became circumferential more distally, down to the lower esophageal sphincter which was located at 40 cm and below which was a 2 cm hiatal hernia.  No varices, mass, neoplasia or infection were evident.  The stomach contained a small to moderate amount of retained food obscuring the proximal greater curve but the cardia, distal greater curve, lesser curve, antrum and pyloric ring were unremarkable.  The patient has a history of a prior  ulcer oversewing procedure but there was no dramatic deformity in the duodenal bulb or in the proximal second duodenum.  Approximately 15 biopsies were obtained from the Barretts segment of the esophagus prior to removal of the scope.  The patient tolerated the procedure well and there were no apparent complications.  IMPRESSION:  Endoscopically unchanged Barretts esophagus.  PLAN:  Await pathology with follow-up to depend on the current histologic findings.  Note that the patients heartburn symptoms were well controlled by the use of Prevacid. Dictated by:   Florencia Reasons, M.D. Attending Physician:  Rich Brave DD:  09/21/01 TD:  09/22/01 Job: [id number]

## 2010-11-16 ENCOUNTER — Other Ambulatory Visit: Payer: Self-pay | Admitting: Gastroenterology

## 2011-02-12 LAB — BASIC METABOLIC PANEL
BUN: 12
BUN: 13
BUN: 8
CO2: 24
CO2: 28
CO2: 24
CO2: 27
Calcium: 7.6 — ABNORMAL LOW
Chloride: 109
Chloride: 105
Chloride: 108
Chloride: 110
Chloride: 111
Creatinine, Ser: 1.04
Creatinine, Ser: 0.84
Creatinine, Ser: 0.94
Creatinine, Ser: 1.08
GFR calc Af Amer: 43 — ABNORMAL LOW
GFR calc Af Amer: >60
GFR calc Af Amer: >60
GFR calc Af Amer: >60
GFR calc Af Amer: >60
GFR calc non Af Amer: >60
Glucose, Bld: 109 — ABNORMAL HIGH
Glucose, Bld: 90
Glucose, Bld: 83
Potassium: 3.7
Potassium: 2.9 — ABNORMAL LOW
Potassium: 3.6
Potassium: 3.8
Potassium: 4.1
Sodium: 143
Sodium: 139

## 2011-02-12 LAB — URINALYSIS, ROUTINE W REFLEX MICROSCOPIC
Bilirubin Urine: NEGATIVE
Bilirubin Urine: NEGATIVE
Glucose, UA: NEGATIVE
Glucose, UA: NEGATIVE
Hgb urine dipstick: NEGATIVE
Hgb urine dipstick: NEGATIVE
Ketones, ur: NEGATIVE
Ketones, ur: 15 — AB
Ketones, ur: NEGATIVE
Nitrite: NEGATIVE
Nitrite: NEGATIVE
Protein, ur: NEGATIVE
Protein, ur: 30 — AB
Specific Gravity, Urine: 1.022
Urobilinogen, UA: 0.2
pH: 5.5
pH: 5.5

## 2011-02-12 LAB — HOMOCYSTEINE: Homocysteine: 18.4 — ABNORMAL HIGH

## 2011-02-12 LAB — COMPREHENSIVE METABOLIC PANEL
ALT: 15
AST: 38 — ABNORMAL HIGH
AST: 40 — ABNORMAL HIGH
AST: 72 — ABNORMAL HIGH
Albumin: 2.9 — ABNORMAL LOW
Albumin: 3.1 — ABNORMAL LOW
Albumin: 3.1 — ABNORMAL LOW
Alkaline Phosphatase: 56
Alkaline Phosphatase: 69
Calcium: 7.8 — ABNORMAL LOW
Chloride: 111
Chloride: 108
Creatinine, Ser: 3.46 — ABNORMAL HIGH
GFR calc Af Amer: 22 — ABNORMAL LOW
GFR calc Af Amer: >60
GFR calc Af Amer: 58 — ABNORMAL LOW
Potassium: 4.2
Potassium: 4.6
Sodium: 141
Sodium: 140
Total Bilirubin: 0.8
Total Bilirubin: 0.8
Total Protein: 4.8 — ABNORMAL LOW
Total Protein: 4.9 — ABNORMAL LOW

## 2011-02-12 LAB — CBC
HCT: 37.8 — ABNORMAL LOW
HCT: 30.5 — ABNORMAL LOW
HCT: 31.1 — ABNORMAL LOW
HCT: 33 — ABNORMAL LOW
Hemoglobin: 12.6 — ABNORMAL LOW
Hemoglobin: 10.9 — ABNORMAL LOW
MCHC: 33.5
MCHC: 33.9
MCHC: 33.2
MCHC: 34
MCV: 93.3
MCV: 92.7
MCV: 93.7
MCV: 95.8
Platelets: 216
Platelets: 234
Platelets: 186
RBC: 3.68 — ABNORMAL LOW
RBC: 4 — ABNORMAL LOW
RBC: 3.29 — ABNORMAL LOW
RBC: 3.32 — ABNORMAL LOW
RBC: 3.44 — ABNORMAL LOW
RDW: 14.4
WBC: 10.1
WBC: 11.8 — ABNORMAL HIGH
WBC: 18.7 — ABNORMAL HIGH
WBC: 6.1
WBC: 8.1

## 2011-02-12 LAB — CULTURE, BLOOD (ROUTINE X 2): Culture: NO GROWTH

## 2011-02-12 LAB — SODIUM, URINE, RANDOM: Sodium, Ur: 158

## 2011-02-12 LAB — DIFFERENTIAL
Basophils Absolute: 0
Basophils Absolute: 0
Basophils Relative: 0
Eosinophils Relative: 1
Eosinophils Relative: 0
Lymphocytes Relative: 20
Monocytes Absolute: 1.1 — ABNORMAL HIGH
Monocytes Absolute: 0.6
Monocytes Relative: 9

## 2011-02-12 LAB — CK TOTAL AND CKMB (NOT AT ARMC)
CK, MB: 11.1 — ABNORMAL HIGH
CK, MB: 12 — ABNORMAL HIGH
CK, MB: 8.9 — ABNORMAL HIGH
Relative Index: 0.5
Relative Index: 0.6
Relative Index: 0.8
Total CK: 1442 — ABNORMAL HIGH

## 2011-02-12 LAB — CK
Total CK: 1672 — ABNORMAL HIGH
Total CK: 1785 — ABNORMAL HIGH

## 2011-02-12 LAB — CREATININE, URINE, RANDOM: Creatinine, Urine: 44.6

## 2011-02-12 LAB — URINE CULTURE
Colony Count: 4000
Special Requests: NEGATIVE

## 2011-02-12 LAB — PROTIME-INR
INR: 1.1
Prothrombin Time: 15

## 2011-02-12 LAB — LIPID PANEL
Cholesterol: 147
LDL Cholesterol: 88

## 2011-02-12 LAB — HEMOGLOBIN A1C
Hgb A1c MFr Bld: 5.5
Mean Plasma Glucose: 111

## 2011-02-12 LAB — URINE MICROSCOPIC-ADD ON

## 2011-02-12 LAB — MAGNESIUM: Magnesium: 2

## 2011-03-12 ENCOUNTER — Inpatient Hospital Stay (HOSPITAL_COMMUNITY)
Admission: EM | Admit: 2011-03-12 | Discharge: 2011-03-16 | DRG: 189 | Disposition: A | Discharge status: Home / Self Care | Payer: Medicare Other | Attending: Internal Medicine | Admitting: Internal Medicine

## 2011-03-12 ENCOUNTER — Emergency Department (HOSPITAL_COMMUNITY): Payer: Medicare Other

## 2011-03-12 DIAGNOSIS — R269 Unspecified abnormalities of gait and mobility: Secondary | ICD-10-CM | POA: Diagnosis present

## 2011-03-12 DIAGNOSIS — Z23 Encounter for immunization: Secondary | ICD-10-CM

## 2011-03-12 DIAGNOSIS — J441 Chronic obstructive pulmonary disease with (acute) exacerbation: Secondary | ICD-10-CM | POA: Diagnosis present

## 2011-03-12 DIAGNOSIS — D72829 Elevated white blood cell count, unspecified: Secondary | ICD-10-CM | POA: Diagnosis present

## 2011-03-12 DIAGNOSIS — I69998 Other sequelae following unspecified cerebrovascular disease: Secondary | ICD-10-CM

## 2011-03-12 DIAGNOSIS — F329 Major depressive disorder, single episode, unspecified: Secondary | ICD-10-CM | POA: Diagnosis present

## 2011-03-12 DIAGNOSIS — F17200 Nicotine dependence, unspecified, uncomplicated: Secondary | ICD-10-CM | POA: Diagnosis present

## 2011-03-12 DIAGNOSIS — J9600 Acute respiratory failure, unspecified whether with hypoxia or hypercapnia: Principal | ICD-10-CM | POA: Diagnosis present

## 2011-03-12 DIAGNOSIS — I10 Essential (primary) hypertension: Secondary | ICD-10-CM | POA: Diagnosis present

## 2011-03-12 DIAGNOSIS — Z79899 Other long term (current) drug therapy: Secondary | ICD-10-CM

## 2011-03-12 DIAGNOSIS — F909 Attention-deficit hyperactivity disorder, unspecified type: Secondary | ICD-10-CM | POA: Diagnosis present

## 2011-03-12 DIAGNOSIS — F411 Generalized anxiety disorder: Secondary | ICD-10-CM | POA: Diagnosis present

## 2011-03-12 LAB — BASIC METABOLIC PANEL
CO2: 25 mEq/L (ref 19–32)
Calcium: 9 mg/dL (ref 8.4–10.5)
Chloride: 102 mEq/L (ref 96–112)
Creatinine, Ser: 0.9 mg/dL (ref 0.50–1.35)
Glucose, Bld: 105 mg/dL — ABNORMAL HIGH (ref 70–99)

## 2011-03-12 LAB — DIFFERENTIAL
Basophils Relative: 0 % (ref 0–1)
Eosinophils Absolute: 0.5 10*3/uL (ref 0.0–0.7)
Eosinophils Relative: 3 % (ref 0–5)
Lymphs Abs: 2.2 10*3/uL (ref 0.7–4.0)
Monocytes Absolute: 1.1 10*3/uL — ABNORMAL HIGH (ref 0.1–1.0)
Monocytes Relative: 7 % (ref 3–12)
Neutrophils Relative %: 76 % (ref 43–77)

## 2011-03-12 LAB — POCT I-STAT TROPONIN I

## 2011-03-12 LAB — CBC
MCH: 32 pg (ref 26.0–34.0)
MCHC: 35.1 g/dL (ref 30.0–36.0)
MCV: 91.2 fL (ref 78.0–100.0)
Platelets: 263 10*3/uL (ref 150–400)
RBC: 5.34 MIL/uL (ref 4.22–5.81)
RDW: 13 % (ref 11.5–15.5)

## 2011-03-12 LAB — PROTIME-INR: Prothrombin Time: 12.8 seconds (ref 11.6–15.2)

## 2011-03-13 LAB — COMPREHENSIVE METABOLIC PANEL
Alkaline Phosphatase: 68 U/L (ref 39–117)
CO2: 28 mEq/L (ref 19–32)
Calcium: 8.8 mg/dL (ref 8.4–10.5)
Creatinine, Ser: 0.97 mg/dL (ref 0.50–1.35)
Glucose, Bld: 111 mg/dL — ABNORMAL HIGH (ref 70–99)
Total Bilirubin: 0.4 mg/dL (ref 0.3–1.2)
Total Protein: 6 g/dL (ref 6.0–8.3)

## 2011-03-13 LAB — CBC
Hemoglobin: 13.8 g/dL (ref 13.0–17.0)
MCH: 30.6 pg (ref 26.0–34.0)
MCHC: 33.5 g/dL (ref 30.0–36.0)
MCV: 91.4 fL (ref 78.0–100.0)
Platelets: 245 10*3/uL (ref 150–400)
RBC: 4.51 MIL/uL (ref 4.22–5.81)

## 2011-03-13 LAB — DIFFERENTIAL
Basophils Relative: 0 % (ref 0–1)
Eosinophils Absolute: 0 10*3/uL (ref 0.0–0.7)
Lymphs Abs: 0.7 10*3/uL (ref 0.7–4.0)
Monocytes Absolute: 0.5 10*3/uL (ref 0.1–1.0)
Monocytes Relative: 5 % (ref 3–12)
Neutrophils Relative %: 89 % — ABNORMAL HIGH (ref 43–77)

## 2011-03-13 LAB — HEMOGLOBIN A1C: Hgb A1c MFr Bld: 5.6 % (ref <5.7)

## 2011-03-14 LAB — DIFFERENTIAL
Eosinophils Relative: 0 % (ref 0–5)
Lymphocytes Relative: 6 % — ABNORMAL LOW (ref 12–46)
Lymphs Abs: 1.1 10*3/uL (ref 0.7–4.0)
Monocytes Absolute: 1.1 10*3/uL — ABNORMAL HIGH (ref 0.1–1.0)
Monocytes Relative: 6 % (ref 3–12)

## 2011-03-14 LAB — COMPREHENSIVE METABOLIC PANEL
Albumin: 3.3 g/dL — ABNORMAL LOW (ref 3.5–5.2)
BUN: 22 mg/dL (ref 6–23)
Creatinine, Ser: 0.93 mg/dL (ref 0.50–1.35)
GFR calc Af Amer: >90 mL/min (ref >90)
Glucose, Bld: 135 mg/dL — ABNORMAL HIGH (ref 70–99)
Total Bilirubin: 0.2 mg/dL — ABNORMAL LOW (ref 0.3–1.2)
Total Protein: 5.8 g/dL — ABNORMAL LOW (ref 6.0–8.3)

## 2011-03-14 LAB — CBC
HCT: 41.9 % (ref 39.0–52.0)
Hemoglobin: 14.1 g/dL (ref 13.0–17.0)
MCHC: 33.7 g/dL (ref 30.0–36.0)
MCV: 91.1 fL (ref 78.0–100.0)
RDW: 13.1 % (ref 11.5–15.5)

## 2011-03-14 LAB — MAGNESIUM: Magnesium: 2 mg/dL (ref 1.5–2.5)

## 2011-03-15 ENCOUNTER — Inpatient Hospital Stay (HOSPITAL_COMMUNITY): Payer: Medicare Other

## 2011-03-15 LAB — BLOOD GAS, ARTERIAL
Bicarbonate: 25.7 mEq/L — ABNORMAL HIGH (ref 20.0–24.0)
O2 Saturation: 96.1 %
TCO2: 26.9 mmol/L (ref 0–100)
pCO2 arterial: 39.3 mmHg (ref 35.0–45.0)
pH, Arterial: 7.431 (ref 7.350–7.450)

## 2011-03-15 LAB — CBC
HCT: 45.2 % (ref 39.0–52.0)
Hemoglobin: 15.1 g/dL (ref 13.0–17.0)
MCHC: 33.4 g/dL (ref 30.0–36.0)
RDW: 13.1 % (ref 11.5–15.5)
WBC: 14.5 10*3/uL — ABNORMAL HIGH (ref 4.0–10.5)

## 2011-03-15 LAB — GLUCOSE, CAPILLARY: Glucose-Capillary: 130 mg/dL — ABNORMAL HIGH (ref 70–99)

## 2011-03-15 MED ORDER — PREDNISONE 10 MG PO TABS
10.0000 mg | ORAL_TABLET | Freq: Once | ORAL | Status: DC
  Filled 2011-03-15: qty 1

## 2011-03-15 MED ORDER — ONDANSETRON HCL 4 MG PO TABS: 4.0000 mg | ORAL_TABLET | Freq: Four times a day (QID) | ORAL | Status: DC | PRN

## 2011-03-15 MED ORDER — HEPARIN SODIUM (PORCINE) 5000 UNIT/ML IJ SOLN
5000.0000 [IU] | Freq: Three times a day (TID) | INTRAMUSCULAR | Status: DC
  Filled 2011-03-15 (×9): qty 1

## 2011-03-15 MED ORDER — AMLODIPINE BESYLATE 5 MG PO TABS
5.0000 mg | ORAL_TABLET | Freq: Every day | ORAL | Status: DC
  Filled 2011-03-15 (×3): qty 1

## 2011-03-15 MED ORDER — AMLODIPINE BESYLATE 5 MG PO TABS
5.0000 mg | ORAL_TABLET | Freq: Every day | ORAL | Status: DC
  Filled 2011-03-15 (×2): qty 1

## 2011-03-15 MED ORDER — AMPHETAMINE-DEXTROAMPHETAMINE 10 MG PO TABS: 5.0000 mg | ORAL_TABLET | ORAL | Status: DC

## 2011-03-15 MED ORDER — FLUOXETINE HCL 20 MG PO CAPS
40.0000 mg | ORAL_CAPSULE | Freq: Every day | ORAL | Status: DC
  Filled 2011-03-15 (×2): qty 2

## 2011-03-15 MED ORDER — ACETAMINOPHEN 325 MG PO TABS: 650.0000 mg | ORAL_TABLET | ORAL | Status: DC | PRN

## 2011-03-15 MED ORDER — SPIRONOLACTONE 25 MG PO TABS
25.0000 mg | ORAL_TABLET | Freq: Two times a day (BID) | ORAL | Status: DC
  Filled 2011-03-15 (×6): qty 1

## 2011-03-15 MED ORDER — NICOTINE 14 MG/24HR TD PT24
14.0000 mg | MEDICATED_PATCH | Freq: Every day | TRANSDERMAL | Status: DC
  Filled 2011-03-15 (×3): qty 1

## 2011-03-15 MED ORDER — ALPRAZOLAM 0.25 MG PO TABS: 0.5000 mg | ORAL_TABLET | Freq: Two times a day (BID) | ORAL | Status: DC | PRN

## 2011-03-15 MED ORDER — IPRATROPIUM BROMIDE 0.02 % IN SOLN: 0.5000 mg | Freq: Four times a day (QID) | RESPIRATORY_TRACT | Status: DC | PRN

## 2011-03-15 MED ORDER — HYDROCODONE-ACETAMINOPHEN 5-325 MG PO TABS: 2.0000 | ORAL_TABLET | Freq: Three times a day (TID) | ORAL | Status: DC | PRN

## 2011-03-15 MED ORDER — MIRTAZAPINE 45 MG PO TABS
45.0000 mg | ORAL_TABLET | Freq: Every day | ORAL | Status: DC
  Filled 2011-03-15 (×3): qty 1

## 2011-03-15 MED ORDER — AMPHETAMINE-DEXTROAMPHETAMINE 10 MG PO TABS: 10.0000 mg | ORAL_TABLET | Freq: Every day | ORAL | Status: DC

## 2011-03-15 MED ORDER — SODIUM CHLORIDE 0.9 % IV SOLN: INTRAVENOUS | Status: DC

## 2011-03-15 MED ORDER — NICOTINE 14 MG/24HR TD PT24
14.0000 mg | MEDICATED_PATCH | Freq: Every day | TRANSDERMAL | Status: DC
  Filled 2011-03-15 (×2): qty 1

## 2011-03-15 MED ORDER — TEMAZEPAM 30 MG PO CAPS: 30.0000 mg | ORAL_CAPSULE | Freq: Every evening | ORAL | Status: DC | PRN

## 2011-03-15 MED ORDER — ALBUTEROL SULFATE (5 MG/ML) 0.5% IN NEBU
2.5000 mg | INHALATION_SOLUTION | Freq: Four times a day (QID) | RESPIRATORY_TRACT | Status: DC | PRN
  Filled 2011-03-15: qty 20

## 2011-03-15 MED ORDER — ONDANSETRON HCL 4 MG/2ML IJ SOLN: 4.0000 mg | Freq: Four times a day (QID) | INTRAMUSCULAR | Status: DC | PRN

## 2011-03-15 MED ORDER — MOXIFLOXACIN HCL IN NACL 400 MG/250ML IV SOLN
400.0000 mg | INTRAVENOUS | Status: DC
  Filled 2011-03-15: qty 250

## 2011-03-15 MED ORDER — AMPHETAMINE-DEXTROAMPHETAMINE 10 MG PO TABS: 5.0000 mg | ORAL_TABLET | Freq: Every day | ORAL | Status: DC

## 2011-03-15 MED ORDER — SALINE SPRAY 0.65 % NA SOLN
1.0000 | NASAL | Status: DC | PRN
  Filled 2011-03-15: qty 44

## 2011-03-15 MED ORDER — PREDNISONE 20 MG PO TABS
20.0000 mg | ORAL_TABLET | Freq: Once | ORAL | Status: DC
  Filled 2011-03-15: qty 1

## 2011-03-15 MED ORDER — FLUOXETINE HCL 20 MG PO CAPS
40.0000 mg | ORAL_CAPSULE | Freq: Every day | ORAL | Status: DC
  Filled 2011-03-15 (×3): qty 2

## 2011-03-16 LAB — CBC
HCT: 43.3 % (ref 39.0–52.0)
Hemoglobin: 14.9 g/dL (ref 13.0–17.0)
MCH: 31.2 pg (ref 26.0–34.0)
RBC: 4.78 MIL/uL (ref 4.22–5.81)

## 2011-03-16 LAB — BASIC METABOLIC PANEL
Calcium: 8.5 mg/dL (ref 8.4–10.5)
Chloride: 108 mEq/L (ref 96–112)
Creatinine, Ser: 1.07 mg/dL (ref 0.50–1.35)

## 2011-03-20 NOTE — H&P (Signed)
NAME:  Reginald Ochoa, Reginald Ochoa NO.:  [id number]  MEDICAL RECORD NO.:  [id number]  LOCATION:  [facility]                         FACILITY:  [facility]  PHYSICIAN:  Marinda Elk, M.D.DATE OF BIRTH:  Sep 16, 1940  DATE OF ADMISSION:  03/12/2011 DATE OF DISCHARGE:                             HISTORY & PHYSICAL   PRIMARY CARE DOCTOR:  Renaye Rakers, MD  CHIEF COMPLAINT:  Shortness of breath.  HISTORY OF PRESENT ILLNESS:  This is a 70 year old male with past medical history of asthma, hypertension, also history of CVA with gait disturbance and discharged in November 2010 from [facility] for major depression that comes in for dyspnea.  He relates that there was a __________ which was going on in his house for about a week ago.  They all got over it.  He started having symptoms about 5 days ago.  Suddenly on the day of admission, he started getting short of breath.  He relates he was using his albuterol pills that were helping him.  But today, they were not.  He relates he could not eat, secondary to shortness of breath and chest discomfort.  So, he decided to come here in the ED.  He relates that progressively it has been getting worse.  He is also having cough.  Denies any fevers.  Denies any nausea or vomiting.  Also, he denies any abdominal pain, chest pain, or diarrhea.  ALLERGIES:  No known drug allergies.  PAST MEDICAL HISTORY: 1. Positive for 1 major depressive disorder. 2. History of CVA. 3. History of hypertension. 4. History of chronic pain secondary to occipital neuralgia. 5. Anxiety.  MEDICATIONS:  Pending at the time of the dictation.  FAMILY HISTORY:  Positive for hypertension, coronary artery disease, and diabetes.  SOCIAL HISTORY:  He used to smoke about 2 packs per day.  He did that for 15 years.  He denies any alcohol occasionally, but no drugs.  He also is on cigars, but he relates he did not inhale.  REVIEW OF SYSTEMS:  A 10-point review of  system done, pertinent positive for HPI.  PHYSICAL EXAMINATION:  VITAL SIGNS:  Temperature 98, pulse of 80, respiration of 20, he was satting 85% on room air.  When he got to the ED, EF decreased and after albuterol treatments and oxygen he was 94% on 2 L.  Blood pressure 155/111. GENERAL:  He is awake, alert, and oriented x3.  Unable to talk in full sentences, appears disheveled. HEENT:  Dry mucous membrane.  No JVD.  No oral hygiene.  Atraumatic and normocephalic. LUNGS:  He has moderate air movement with wheezing bilaterally. CARDIOVASCULAR:  He is tachycardic with a regular rate and rhythm with positive S1 and S2.  No murmurs, rubs, or gallops appreciated.  Distant heart sounds. ABDOMEN:  Positive bowel sounds.  Nontender and nondistended.  Soft. EXTREMITIES:  Positive pulses.  No clubbing, cyanosis, or edema.  Long nails and dirty feet. SKIN:  No rashes or ulceration. NEUROLOGIC:  Nonfocal. MUSCULOSKELETAL:  Intact.  LABORATORY DATA:  On admission, sodium 140, potassium 3.7, chloride 102, bicarb 25, glucose 105, BUN of 18 creatinine 0.9, and calcium 9.0.  His point of cardiac care markers is  negative x1.  His PT is 12 and INR 0.9. His white count is 16.1 with an ANC of 12.1, hemoglobin of 17 with an MCV of 91, and platelet count of 283.  ASSESSMENT/PLAN: 1. Acute hypoxic respiratory failure, probably secondary to asthma     versus chronic obstructive pulmonary disease exacerbation.  We will     put him on oxygen.  Regular IV steroids and albuterol on IV     antibiotics.  At this time, I am concerned as he does have a     history of asthma, but he used to smoke.   PFTs as an     outpatient for asthma or chronic obstructive pulmonary     disease.  At this time, he is satting much better after treatment.     So, we will make him to regular floor.  We will continue monitor. 2. Hypertension.  We will continue home meds. 3. Leukocytosis, this is probably secondary to his viral  infection,     they all had in the family.  We will still cover him with     antibiotics for 7 days.     Marinda Elk, M.D.     AF/MEDQ  D:  03/12/2011  T:  03/12/2011  Job:  [id number]  Electronically Signed by Marinda Elk M.D. on 03/20/2011 05:54:53 PM

## 2011-04-02 NOTE — Discharge Summary (Signed)
NAME:  Reginald Ochoa, Reginald Ochoa NO.:  [id number]  MEDICAL RECORD NO.:  [id number]  LOCATION:  4506                         FACILITY:  [facility]  PHYSICIAN:  Baltazar Najjar, MD     DATE OF BIRTH:  Oct 28, 1940  DATE OF ADMISSION:  03/12/2011 DATE OF DISCHARGE:                              DISCHARGE SUMMARY   FINAL DISCHARGE DIAGNOSES: 1. Acute hypoxic respiratory failure, resolved. 2. Asthma exacerbation. 3. Leukocytosis. 4. Hypertension. 5. History of attention deficit hyperactivity disorder. 6. Low-grade fevers.  RADIOLOGY/IMAGING STUDIES: 1. Chest x-ray on March 12, 2011, showed minimal emphysematous and     bronchitic changes.  No acute abnormalities. 2. Repeat x-ray on March 15, 2011, [place] and lateral view showed no     evidence of acute cardiopulmonary disease.  Hyperinflation with     chronic interstitial markings.  BRIEF ADMITTING HISTORY:  Please refer to H and P for more details.  On summary, [name] is a 70 year old man who is a smoker, currently trying to quit, presented to the ED with chief complaint of shortness of breath.  He had a remote history of asthma.  On presentation, the patient was having respiratory distress, and he was hypoxic.  His O2 sat was 85% on room air.  He was noted to have wheezings bilaterally.  Please refer to H and P for more details.  HOSPITAL COURSE: 1. Acute hypoxic respiratory failure, most likely secondary to asthma     exacerbation.  The patient was admitted to the medical floor.  He     was placed on oxygen, started on IV steroids and IV Avelox.  He was     also covered with nebulizer treatment with albuterol and Atrovent.     His chest x-ray did not show any evidence of pneumonia; however, it     did show some emphysematous changes.  Noted history of smoking,     COPD will also need to be ruled out, that can be done as an     outpatient.  The patient to follow with his PCP for further workup     including  pulmonary function tests.  ABG done during this     hospitalization showed no evidence of CO2 retention.  His pCO2 was     39.3, and his PO2 was 76.6 with an O2 sat of 96.1%.  The patient     significantly improved.  His lungs exam is currently clear, his O2     sat is 97% on room air, and he is not in any distress.  I will     discharge him on albuterol and Atrovent inhalers, Avelox, and     prednisone taper.  He already received prednisone in the hospital     and he has 2 more doses to go. 2. Leukocytosis.  The patient noted to have elevated white blood     count, highest was 17; however, the patient was also on steroid,     and he had an acute jump of his white count from 11 to 17 in 1 day     which is common with steroids.  The patient's chest x-ray did not  show any infection, his white count is trending down nicely with     tapering the steroids down.  Today, his WBC is 12.1.  The patient     noted to have low-grade fevers during this hospitalization, the     highest was 100.2.  I am suspecting possibly he has some bronchitis    on top of his asthma exacerbation and that hopefully should improve     with Avelox treatment. 3. Hypertension.  His blood pressure fluctuates and currently     uncontrolled.  I am suspecting anxiety is playing a role here.  I     will increase his amlodipine to 10 mg, and he can continue also his     Aldactone and monitor his blood pressure and follow up with his     PCP.  I noted that his creatinine has slightly increased from a     creatinine of 0.9-1.07, possibly hemodynamic changes secondary to     diuretics and maybe poor intake while he is in the hospital.  I     would recommend to repeat his renal function as an outpatient just     to follow up on that; however, I will keep him on his Aldactone for     now.  The patient will follow up with his PCP on discharge. 4. Attention deficit hyperactivity disorder.  The patient was     continued on his  outpatient Adderall, no acute issues. 5. Anxiety.  The patient was continued on his medication as well. 6. The patient is seen and examined by me today.  He is awake and     alert x3, not in acute distress.  No shortness of breath noted.     Chest exam is clear.  Vital signs today, blood pressure 160/90,     heart rate of 90, temperature 97, and his O2 saturation 97% on room     air.  The patient is medically stable for discharge to home today. I spoke to his wife yesterday and updated her with the treatment plan.  DISCHARGE MEDICATIONS: 1. Albuterol inhaler 2 puffs inhaled q.4-6 hours p.r.n. 2. Atrovent inhaler 2 puffs inhaled 4 times daily as needed. 3. Avelox 400 mg 1 tablet p.o. daily for 3 more days to complete 7     days of therapy. 4. Prednisone 10 to 20 mg p.o. daily, take 20 mg p.o. tomorrow on     March 17, 2011, and 10 mg on March 18, 2011, then stop. 5. Amlodipine 10 mg p.o. daily. 6. Adderall 10 mg tablets half to one tablet p.o. twice daily as     instructed, she takes 1 tablet in the morning and half a tablet in     the evening. 7. Alprazolam 1 mg half to one tablet p.o. twice daily as needed. 8. Fluoxetine 40 mg 1 tablet p.o. q.a.m. 9. Hydrocodone/APAP 10/500 mg 1 tablet p.o. q.8 hours p.r.n. 10.Mirtazapine 45 mg 1 tablet p.o. daily at bedtime. 11.Spironolactone 25 mg 1 tablet p.o. twice daily. 12.Temazepam 30 mg 1 capsule p.o. daily at night p.r.n.  DISCHARGE INSTRUCTIONS: 1. The patient to take the above medications as prescribed. 2. The patient is advised to call his doctor on Monday to schedule an     appointment within 1 week of discharge. 3. I will recommend to repeat his labs including basic metabolic panel     and CBC to reassess his renal function, and follow up on his white  count.  I will defer that to his PCP. 4. The patient advised to report any worsening of symptoms or any new     symptoms to his PCP or come to the ED.  CONDITION ON DISCHARGE:   Improved.          ______________________________ Baltazar Najjar, MD    SA/MEDQ  D:  03/16/2011  T:  03/16/2011  Job:  [id number]  cc:   Renaye Rakers, M.D.  Electronically Signed by Hannah Beat MD on 04/02/2011 03:58:03 PM

## 2012-03-05 ENCOUNTER — Emergency Department (HOSPITAL_COMMUNITY): Payer: Medicare Other

## 2012-03-05 ENCOUNTER — Emergency Department (HOSPITAL_COMMUNITY)
Admission: EM | Admit: 2012-03-05 | Discharge: 2012-03-05 | Disposition: A | Discharge status: Home / Self Care | Payer: Medicare Other | Attending: Emergency Medicine | Admitting: Emergency Medicine

## 2012-03-05 ENCOUNTER — Encounter (HOSPITAL_COMMUNITY): Payer: Self-pay | Admitting: *Deleted

## 2012-03-05 DIAGNOSIS — J45909 Unspecified asthma, uncomplicated: Secondary | ICD-10-CM

## 2012-03-05 DIAGNOSIS — J45901 Unspecified asthma with (acute) exacerbation: Secondary | ICD-10-CM | POA: Insufficient documentation

## 2012-03-05 DIAGNOSIS — F17200 Nicotine dependence, unspecified, uncomplicated: Secondary | ICD-10-CM | POA: Insufficient documentation

## 2012-03-05 DIAGNOSIS — Z79899 Other long term (current) drug therapy: Secondary | ICD-10-CM | POA: Insufficient documentation

## 2012-03-05 DIAGNOSIS — I10 Essential (primary) hypertension: Secondary | ICD-10-CM | POA: Insufficient documentation

## 2012-03-05 HISTORY — DX: Essential (primary) hypertension: I10

## 2012-03-05 HISTORY — DX: Unspecified asthma, uncomplicated: J45.909

## 2012-03-05 LAB — CBC
Hemoglobin: 16.5 g/dL (ref 13.0–17.0)
MCH: 30.4 pg (ref 26.0–34.0)
MCHC: 33.7 g/dL (ref 30.0–36.0)
MCV: 90.1 fL (ref 78.0–100.0)
RBC: 5.43 MIL/uL (ref 4.22–5.81)

## 2012-03-05 LAB — BASIC METABOLIC PANEL
BUN: 26 mg/dL — ABNORMAL HIGH (ref 6–23)
CO2: 31 mEq/L (ref 19–32)
Calcium: 9.2 mg/dL (ref 8.4–10.5)
Creatinine, Ser: 1.3 mg/dL (ref 0.50–1.35)
Glucose, Bld: 122 mg/dL — ABNORMAL HIGH (ref 70–99)

## 2012-03-05 MED ORDER — DOXYCYCLINE HYCLATE 100 MG PO TABS
100.0000 mg | ORAL_TABLET | Freq: Once | ORAL | Status: AC
  Administered 2012-03-05: 100 mg via ORAL
  Filled 2012-03-05: qty 1

## 2012-03-05 MED ORDER — SODIUM CHLORIDE 0.9 % IV SOLN
Freq: Once | INTRAVENOUS | Status: AC
  Administered 2012-03-05: 05:00:00 via INTRAVENOUS

## 2012-03-05 MED ORDER — DOXYCYCLINE HYCLATE 100 MG PO CAPS: 100.0000 mg | ORAL_CAPSULE | Freq: Two times a day (BID) | ORAL | Status: DC

## 2012-03-05 MED ORDER — ALBUTEROL SULFATE (5 MG/ML) 0.5% IN NEBU
2.5000 mg | INHALATION_SOLUTION | Freq: Once | RESPIRATORY_TRACT | Status: AC
  Administered 2012-03-05: 2.5 mg via RESPIRATORY_TRACT

## 2012-03-05 MED ORDER — ALBUTEROL SULFATE (5 MG/ML) 0.5% IN NEBU
INHALATION_SOLUTION | RESPIRATORY_TRACT | Status: AC
  Administered 2012-03-05: 2.5 mg via RESPIRATORY_TRACT
  Filled 2012-03-05: qty 1

## 2012-03-05 MED ORDER — HYDROCODONE-HOMATROPINE 5-1.5 MG/5ML PO SYRP: 5.0000 mL | ORAL_SOLUTION | ORAL | Status: DC | PRN

## 2012-03-05 MED ORDER — ALBUTEROL SULFATE (5 MG/ML) 0.5% IN NEBU
2.5000 mg | INHALATION_SOLUTION | Freq: Once | RESPIRATORY_TRACT | Status: AC
  Administered 2012-03-05 (×2): 2.5 mg via RESPIRATORY_TRACT

## 2012-03-05 MED ORDER — IPRATROPIUM BROMIDE 0.02 % IN SOLN
0.5000 mg | Freq: Once | RESPIRATORY_TRACT | Status: AC
  Administered 2012-03-05: 0.5 mg via RESPIRATORY_TRACT

## 2012-03-05 MED ORDER — ALBUTEROL SULFATE (5 MG/ML) 0.5% IN NEBU
5.0000 mg | INHALATION_SOLUTION | Freq: Once | RESPIRATORY_TRACT | Status: AC
  Administered 2012-03-05: 5 mg via RESPIRATORY_TRACT
  Filled 2012-03-05: qty 1

## 2012-03-05 MED ORDER — IPRATROPIUM BROMIDE 0.02 % IN SOLN
RESPIRATORY_TRACT | Status: AC
  Administered 2012-03-05: 0.5 mg via RESPIRATORY_TRACT
  Filled 2012-03-05: qty 2.5

## 2012-03-05 MED ORDER — DEXTROSE 5 % IV SOLN
1.0000 g | Freq: Once | INTRAVENOUS | Status: AC
  Administered 2012-03-05: 1 g via INTRAVENOUS
  Filled 2012-03-05: qty 10

## 2012-03-05 MED ORDER — DEXAMETHASONE SODIUM PHOSPHATE 10 MG/ML IJ SOLN
10.0000 mg | Freq: Once | INTRAMUSCULAR | Status: AC
  Administered 2012-03-05: 10 mg via INTRAVENOUS
  Filled 2012-03-05: qty 1

## 2012-03-05 MED ORDER — ALBUTEROL SULFATE HFA 108 (90 BASE) MCG/ACT IN AERS
2.0000 | INHALATION_SPRAY | RESPIRATORY_TRACT | Status: DC | PRN
  Administered 2012-03-05: 2 via RESPIRATORY_TRACT
  Filled 2012-03-05: qty 6.7

## 2012-03-05 NOTE — ED Notes (Signed)
Assessment done by Suanne Marker, RN. Johny Drilling, NT was signed into computer.

## 2012-03-05 NOTE — ED Notes (Signed)
Care handoff charted in error. Patient remains in bed 14 [facility].

## 2012-03-05 NOTE — ED Provider Notes (Addendum)
History     CSN: [id number]  Arrival date & time 03/05/12  0354   First MD Initiated Contact with Patient 03/05/12 0421      Chief Complaint  Patient presents with  . Shortness of Breath    (Consider location/radiation/quality/duration/timing/severity/associated sxs/prior treatment) HPI This is a 71 year old male with a history of asthma. He is here complaining of shortness of breath that began about 9:30 yesterday evening. He states he does not have frequent attacks but when they occur they typically require a visit to the ED area he states he has used up his inhaler but continues to have wheezing and tachypnea. He states he is ribs and abdominal wall or sore from coughing. He has not had a fever. He has not had nausea or vomiting. He was given albuterol and Atrovent treatment per nursing protocol on arrival with partial improvement.  Past Medical History  Diagnosis Date  . Asthma   . Hypertension     Past Surgical History  Procedure Date  . Bleeding ulcer     History reviewed. No pertinent family history.  History  Substance Use Topics  . Smoking status: Current Every Day Smoker -- 0.5 packs/day for 40 years    Types: Cigarettes  . Smokeless tobacco: Not on file  . Alcohol Use:       Review of Systems  All other systems reviewed and are negative.    Allergies  Review of patient's allergies indicates no known allergies.  Home Medications   Current Outpatient Rx  Name Route Sig Dispense Refill  . ALPRAZOLAM 1 MG PO TABS Oral Take 0.5-1 mg by mouth 2 (two) times daily as needed. Anxiety     . AMPHETAMINE-DEXTROAMPHETAMINE 10 MG PO TABS Oral Take 5-10 mg by mouth 2 (two) times daily. Takes 1.5 in the morning and take 1 tablet in then evening    . FLUOXETINE HCL 40 MG PO CAPS Oral Take 40 mg by mouth daily with breakfast.      . HYDROCODONE-ACETAMINOPHEN 10-500 MG PO TABS Oral Take 1 tablet by mouth every 8 (eight) hours as needed. For pain     . MIRTAZAPINE 45  MG PO TABS Oral Take 45 mg by mouth at bedtime.      . SPIRONOLACTONE 25 MG PO TABS Oral Take 25 mg by mouth 2 (two) times daily.      Marland Kitchen TEMAZEPAM 30 MG PO CAPS Oral Take 30 mg by mouth at bedtime as needed. For sleep       BP 133/88  Pulse 110  Temp 97.8 F (36.6 C) (Oral)  Resp 22  SpO2 96%  Physical Exam General: Well-developed, well-nourished male in no acute distress; appearance consistent with age of record HENT: normocephalic, atraumatic Eyes: pupils equal round and reactive to light; left ptosis; left lateral strabismus; arcus senilis bilaterally Neck: supple Heart: regular rate and rhythm; tachycardia Lungs: Tachypnea; shallow breaths; decreased air movement bilaterally with inspiratory nitrite wheezing Chest: Lower rib tenderness bilaterally Abdomen: soft; nondistended Extremities: No deformity; full range of motion; pulses normal Neurologic: Awake, alert and oriented; motor function intact in all extremities and symmetric; no facial droop Skin: Warm and dry Psychiatric: Normal mood and affect    ED Course  Procedures (including critical care time)     MDM   Nursing notes and vitals signs, including pulse oximetry, reviewed.  Summary of this visit's results, reviewed by myself:  Labs:  Results for orders placed during the hospital encounter of 03/05/12  CBC  Component Value Range   WBC 17.6 (*) 4.0 - 10.5 K/uL   RBC 5.43  4.22 - 5.81 MIL/uL   Hemoglobin 16.5  13.0 - 17.0 g/dL   HCT 16.1  09.6 - 04.5 %   MCV 90.1  78.0 - 100.0 fL   MCH 30.4  26.0 - 34.0 pg   MCHC 33.7  30.0 - 36.0 g/dL   RDW 40.9  81.1 - 91.4 %   Platelets 284  150 - 400 K/uL  BASIC METABOLIC PANEL      Component Value Range   Sodium 142  135 - 145 mEq/L   Potassium 4.7  3.5 - 5.1 mEq/L   Chloride 101  96 - 112 mEq/L   CO2 31  19 - 32 mEq/L   Glucose, Bld 122 (*) 70 - 99 mg/dL   BUN 26 (*) 6 - 23 mg/dL   Creatinine, Ser 7.82  0.50 - 1.35 mg/dL   Calcium 9.2  8.4 - 95.6 mg/dL     GFR calc non Af Amer 54 (*) >90 mL/min   GFR calc Af Amer 63 (*) >90 mL/min  PRO B NATRIURETIC PEPTIDE      Component Value Range   Pro B Natriuretic peptide (BNP) 385.0 (*) 0 - 125 pg/mL  POCT I-STAT TROPONIN I      Component Value Range   Troponin i, poc 0.01  0.00 - 0.08 ng/mL   Comment 3             Imaging Studies: Dg Chest 2 View  03/05/2012  *RADIOLOGY REPORT*  Clinical Data: Shortness of breath  CHEST - 2 VIEW  Comparison: 03/15/2011  Findings: Hyperinflation with chronic interstitial coarsening. Mild right middle lobe opacity. The cardiomediastinal contours are within normal range.  No pleural effusion or pneumothorax. multilevel degenerative change.  No acute osseous finding.  IMPRESSION: Right middle lobe opacity may reflect pneumonia or atelectasis. Recommend radiograph follow-up after symptoms resolve to document resolution.   Original Report Authenticated By: Waneta Martins, M.D.    5:45 AM The patient's breathing has improved significantly. There is minimal wheezing at this time. The patient states he feels back to near baseline. He was advised of laboratory and x-ray findings. Admission to the hospital was recommended but he declines at this time. We will start him on an antibiotic for possible early pneumonia. We will provide her with an inhaler. He was advised to please return should he worsen.  EKG Interpretation:  Date & Time: 03/05/2012 4:08 AM  Rate: 123  Rhythm: sinus tachycardia  QRS Axis: Markedly posterior  Intervals: normal  ST/T Wave abnormalities: normal  Conduction Disutrbances:none  Narrative Interpretation: Right ventricular hypertrophy  Old EKG Reviewed: unchanged          Hanley Seamen, MD 03/05/12 2130  Hanley Seamen, MD 03/05/12 [phone]

## 2012-03-05 NOTE — ED Notes (Addendum)
Patient reports that about 4 hours ago he began feeling short of breath. Patient reports a hx of childhood asthma.

## 2013-11-24 ENCOUNTER — Other Ambulatory Visit: Payer: Self-pay | Admitting: Gastroenterology

## 2014-05-09 ENCOUNTER — Encounter (HOSPITAL_COMMUNITY): Payer: Self-pay | Admitting: *Deleted

## 2014-05-09 ENCOUNTER — Emergency Department (HOSPITAL_COMMUNITY)
Admission: EM | Admit: 2014-05-09 | Discharge: 2014-05-09 | Disposition: A | Discharge status: Home / Self Care | Payer: Medicare Other | Attending: Emergency Medicine | Admitting: Emergency Medicine

## 2014-05-09 ENCOUNTER — Emergency Department (HOSPITAL_COMMUNITY): Payer: Medicare Other

## 2014-05-09 DIAGNOSIS — Y998 Other external cause status: Secondary | ICD-10-CM | POA: Diagnosis not present

## 2014-05-09 DIAGNOSIS — J45909 Unspecified asthma, uncomplicated: Secondary | ICD-10-CM | POA: Diagnosis not present

## 2014-05-09 DIAGNOSIS — Z23 Encounter for immunization: Secondary | ICD-10-CM | POA: Diagnosis not present

## 2014-05-09 DIAGNOSIS — S0990XA Unspecified injury of head, initial encounter: Secondary | ICD-10-CM | POA: Diagnosis not present

## 2014-05-09 DIAGNOSIS — Y9289 Other specified places as the place of occurrence of the external cause: Secondary | ICD-10-CM | POA: Insufficient documentation

## 2014-05-09 DIAGNOSIS — R000 Tachycardia, unspecified: Secondary | ICD-10-CM | POA: Insufficient documentation

## 2014-05-09 DIAGNOSIS — I10 Essential (primary) hypertension: Secondary | ICD-10-CM | POA: Diagnosis not present

## 2014-05-09 DIAGNOSIS — T07XXXA Unspecified multiple injuries, initial encounter: Secondary | ICD-10-CM

## 2014-05-09 DIAGNOSIS — W01198A Fall on same level from slipping, tripping and stumbling with subsequent striking against other object, initial encounter: Secondary | ICD-10-CM | POA: Diagnosis not present

## 2014-05-09 DIAGNOSIS — Y9301 Activity, walking, marching and hiking: Secondary | ICD-10-CM | POA: Diagnosis not present

## 2014-05-09 DIAGNOSIS — Z79899 Other long term (current) drug therapy: Secondary | ICD-10-CM | POA: Diagnosis not present

## 2014-05-09 DIAGNOSIS — S60512A Abrasion of left hand, initial encounter: Secondary | ICD-10-CM | POA: Diagnosis not present

## 2014-05-09 DIAGNOSIS — W19XXXA Unspecified fall, initial encounter: Secondary | ICD-10-CM

## 2014-05-09 DIAGNOSIS — Z720 Tobacco use: Secondary | ICD-10-CM | POA: Diagnosis not present

## 2014-05-09 DIAGNOSIS — S0083XA Contusion of other part of head, initial encounter: Secondary | ICD-10-CM | POA: Diagnosis not present

## 2014-05-09 MED ORDER — TETANUS-DIPHTH-ACELL PERTUSSIS 5-2.5-18.5 LF-MCG/0.5 IM SUSP
0.5000 mL | Freq: Once | INTRAMUSCULAR | Status: AC
  Administered 2014-05-09: 0.5 mL via INTRAMUSCULAR
  Filled 2014-05-09: qty 0.5

## 2014-05-09 MED ORDER — HYDROCODONE-ACETAMINOPHEN 5-325 MG PO TABS
2.0000 | ORAL_TABLET | Freq: Once | ORAL | Status: AC
  Administered 2014-05-09: 2 via ORAL
  Filled 2014-05-09: qty 2

## 2014-05-09 NOTE — ED Notes (Signed)
The pt reports that he tripped and fell earlier tonight  Striking the cincrete.  Abrasions to the lt face lt hand and lt knee.  ??  loc

## 2014-05-09 NOTE — ED Provider Notes (Signed)
Medical screening examination/treatment/procedure(s) were conducted as a shared visit with non-physician practitioner(s) and myself.  I personally evaluated the patient during the encounter.   EKG Interpretation   Date/Time:  Monday May 09 2014 01:12:31 EST Ventricular Rate:  102 PR Interval:  176 QRS Duration: 92 QT Interval:  360 QTC Calculation: 469 R Axis:   91 Text Interpretation:  Sinus tachycardia Rightward axis Borderline ECG No  significant change was found Confirmed by [facility]  MD, TREY ([id number]) on  05/09/2014 7:21:36 AM      73 yo male presenting after a fall last night.  He slipped on some grass, falling and striking his left face/head.  On exam, well appearing, nontoxic, not distressed, normal respiratory effort, normal perfusion, contusion to left periorbital region, good EOM of left eye, chronic strabismus of right eye, normal gross vision of left eye, neck nontender, full ROM without pain.  Imaging negative.  tdap updated (last reported as over 5 years ago.) Appears stable for DC.  Clinical Impression: 1. Head injury, initial encounter   2. Fall   3. Contusion of face, initial encounter   4. Abrasions of multiple sites       Artis Delay, MD 05/09/14 [phone]

## 2014-05-09 NOTE — ED Notes (Signed)
Patient wound cleaned with bactrim Per PA

## 2014-05-09 NOTE — ED Notes (Signed)
Pt signed discharged signature not showing in chart.,

## 2014-05-09 NOTE — Discharge Instructions (Signed)
Please read and follow all provided instructions.  Your diagnoses today include:  1. Head injury, initial encounter   2. Fall   3. Contusion of face, initial encounter   4. Abrasions of multiple sites     Tests performed today include:  CT scan of your head and face which showed bruising and no serious injury.  Vital signs. See below for your results today.   Medications prescribed:   None  Take any prescribed medications only as directed.  Home care instructions:  Follow any educational materials contained in this packet.  Take home pain medications as directed.   BE VERY CAREFUL not to take multiple medicines containing Tylenol (also called acetaminophen). Doing so can lead to an overdose which can damage your liver and cause liver failure and possibly death.   Follow-up instructions: Please follow-up with your primary care provider in the next 3 days for further evaluation of your symptoms.   Return instructions:  SEEK IMMEDIATE MEDICAL ATTENTION IF:  There is confusion or drowsiness (although children frequently become drowsy after injury).   You cannot awaken the injured person.   You have more than one episode of vomiting.   You notice dizziness or unsteadiness which is getting worse, or inability to walk.   You have convulsions or unconsciousness.   You experience severe, persistent headaches not relieved by Tylenol.  You cannot use arms or legs normally.   There are changes in pupil sizes. (This is the black center in the colored part of the eye)   There is clear or bloody discharge from the nose or ears.   You have change in speech, vision, swallowing, or understanding.   Localized weakness, numbness, tingling, or change in bowel or bladder control.  You have any other emergent concerns.  Additional Information: You have had a head injury which does not appear to require admission at this time.  Your vital signs today were: BP 150/98 mmHg   Pulse  109   Temp(Src) 99.2 F (37.3 C) (Oral)   Resp 16   Ht 5' 7.5" (1.715 m)   Wt 147 lb (66.679 kg)   BMI 22.67 kg/m2   SpO2 97% If your blood pressure (BP) was elevated above 135/85 this visit, please have this repeated by your doctor within one month. --------------

## 2014-05-09 NOTE — ED Provider Notes (Signed)
CSN: [id number]     Arrival date & time 05/09/14  0051 History   First MD Initiated Contact with Patient 05/09/14 0600     Chief Complaint  Patient presents with  . Fall     (Consider location/radiation/quality/duration/timing/severity/associated sxs/prior Treatment) HPI Comments: Patient presents the emergency department after a fall. Patient states that he was walking after dark and slipped on wet grass. Patient fell onto his left face, left hand, and bilateral knees. He has abrasions in these areas. Patient has swelling and abrasions below his left eye. He is uncertain as to the exact time of the fall. 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 He does however deny loss of consciousness. He states that he "saw cartoons stars" after falling. Patient cannot see out of his right eye at baseline. He notes increased blurry vision in left eye. He has a headache which is not severe. He does not have nausea or vomiting. No neck pain or pain with movement of head. No chest pain or shortness of breath now or prior to the incident. No abdominal pain. No difficulty moving his legs. He ambulates at baseline. No treatments prior to arrival.  The history is provided by the patient.    Past Medical History  Diagnosis Date  . Asthma   . Hypertension    Past Surgical History  Procedure Laterality Date  . Bleeding ulcer     No family history on file. History  Substance Use Topics  . Smoking status: Current Every Day Smoker -- 0.50 packs/day for 40 years    Types: Cigarettes  . Smokeless tobacco: Not on file  . Alcohol Use: Not on file    Review of Systems  Constitutional: Negative for fatigue.  HENT: Negative for tinnitus.   Eyes: Positive for visual disturbance (blurry vision). Negative for photophobia and pain.  Respiratory: Negative for shortness of breath.   Cardiovascular: Negative for chest pain.  Gastrointestinal: Negative for nausea and vomiting.  Musculoskeletal: Negative for  back pain, gait problem and neck pain.  Skin: Positive for wound.  Neurological: Positive for headaches. Negative for dizziness, weakness, light-headedness and numbness.  Psychiatric/Behavioral: Negative for confusion and decreased concentration.      Allergies  Review of patient's allergies indicates no known allergies.  Home Medications   Prior to Admission medications   Medication Sig Start Date End Date Taking? Authorizing Provider  albuterol (PROVENTIL HFA;VENTOLIN HFA) 108 (90 BASE) MCG/ACT inhaler Inhale 2 puffs into the lungs every 6 (six) hours as needed for wheezing or shortness of breath.   Yes Historical Provider, MD  ALPRAZolam Duanne Moron) 1 MG tablet Take 0.5-1 mg by mouth 2 (two) times daily as needed. Anxiety    Yes Historical Provider, MD  amphetamine-dextroamphetamine (ADDERALL) 10 MG tablet Take 15 mg by mouth every morning.    Yes Historical Provider, MD  atorvastatin (LIPITOR) 20 MG tablet Take 20 mg by mouth daily.   Yes Historical Provider, MD  FLUoxetine (PROZAC) 40 MG capsule Take 40 mg by mouth daily with breakfast.     Yes Historical Provider, MD  HYDROcodone-acetaminophen (LORTAB) 10-500 MG per tablet Take 1 tablet by mouth every 8 (eight) hours as needed. For pain    Yes Historical Provider, MD  HYDROcodone-homatropine (HYCODAN) 5-1.5 MG/5ML syrup Take 5 mLs by mouth every 4 (four) hours as needed for cough or pain. 03/05/12  Yes John L Molpus, MD  mirtazapine (REMERON) 45 MG tablet Take 45 mg by mouth at bedtime.  Yes Historical Provider, MD  Multiple Vitamins-Minerals (MULTIVITAMIN WITH MINERALS) tablet Take 1-2 tablets by mouth daily.   Yes Historical Provider, MD  spironolactone (ALDACTONE) 25 MG tablet Take 25 mg by mouth 2 (two) times daily.     Yes Historical Provider, MD  temazepam (RESTORIL) 30 MG capsule Take 30 mg by mouth at bedtime as needed. For sleep    Yes Historical Provider, MD  doxycycline (VIBRAMYCIN) 100 MG capsule Take 1 capsule (100 mg  total) by mouth 2 (two) times daily. Patient not taking: Reported on 05/09/2014 03/05/12   Karen Chafe Molpus, MD   BP 145/101 mmHg  Pulse 112  Temp(Src) 99.4 F (37.4 C) (Oral)  Resp 15  Ht 5' 7.5" (1.715 m)  Wt 147 lb (66.679 kg)  BMI 22.67 kg/m2  SpO2 100%   Physical Exam  Constitutional: He is oriented to person, place, and time. He appears well-developed and well-nourished.  HENT:  Head: Normocephalic. Head is with contusion. Head is without raccoon's eyes and without Battle's sign.    Right Ear: Tympanic membrane, external ear and ear canal normal. No hemotympanum.  Left Ear: Tympanic membrane, external ear and ear canal normal. No hemotympanum.  Nose: Nose normal. No nasal septal hematoma.  Mouth/Throat: Oropharynx is clear and moist.  Eyes: Conjunctivae, EOM and lids are normal. Pupils are equal, round, and reactive to light.  No visible hyphema. Full ROM of eyes in all directions without pain.   Neck: Normal range of motion. Neck supple.  No c-spine tenderness.   Cardiovascular: Regular rhythm.  Tachycardia present.   No murmur heard. Mild tachycardia 100-105  Pulmonary/Chest: Effort normal and breath sounds normal. No respiratory distress. He has no wheezes. He has no rales.  Abdominal: Soft. There is no tenderness. There is no rebound.  Musculoskeletal: Normal range of motion. He exhibits no edema or tenderness.       Right shoulder: Normal.       Left shoulder: Normal.       Right elbow: Normal.      Left elbow: Normal.       Right wrist: Normal.       Left wrist: Normal.       Right hip: Normal.       Left hip: Normal.       Right knee: He exhibits ecchymosis. He exhibits normal range of motion and no swelling. No tenderness found.       Left knee: He exhibits normal range of motion and no swelling. No tenderness found.       Right ankle: Normal.       Left ankle: Normal.       Cervical back: He exhibits normal range of motion, no tenderness and no bony  tenderness.       Thoracic back: He exhibits no tenderness and no bony tenderness.       Lumbar back: He exhibits no tenderness and no bony tenderness.       Left hand: He exhibits laceration (abrasion without laceration). He exhibits normal range of motion, no tenderness and no swelling. Normal sensation noted. Normal strength noted.       Hands: Neurological: He is alert and oriented to person, place, and time. He has normal strength and normal reflexes. No cranial nerve deficit or sensory deficit. Coordination normal. GCS eye subscore is 4. GCS verbal subscore is 5. GCS motor subscore is 6.  Skin: Skin is warm and dry.  Psychiatric: He has a normal mood and affect.  Nursing note and vitals reviewed.   ED Course  Procedures (including critical care time) Labs Review Labs Reviewed - No data to display  Imaging Review Ct Head Wo Contrast  05/09/2014   CLINICAL DATA:  Fall last night hitting concrete. Bruising and swelling to left eye and left cheek with diminished vision. No loss of consciousness. Initial encounter.  EXAM: CT HEAD WITHOUT CONTRAST  CT MAXILLOFACIAL WITHOUT CONTRAST  TECHNIQUE: Multidetector CT imaging of the head and maxillofacial structures were performed using the standard protocol without intravenous contrast. Multiplanar CT image reconstructions of the maxillofacial structures were also generated.  COMPARISON:  Head MRI 02/28/2010 and CT 11/21/2008  FINDINGS: CT HEAD FINDINGS  Encephalomalacia is again seen in the occipital lobes consistent with chronic infarcts with ex vacuo dilatation of the right greater than left occipital horns of lateral ventricles, overall stable to mildly progressed compared to the prior CT. Patchy hypodensities in the cerebral white matter elsewhere are nonspecific but compatible with mild chronic small vessel ischemic disease. There is no evidence of acute cortical infarct, intracranial hemorrhage, mass, midline shift, or extra-axial fluid  collection. Prior left cataract extraction is noted. No acute skull fracture is identified.  CT MAXILLOFACIAL FINDINGS  There is moderate soft tissue swelling involving the left cheek and left periorbital soft tissues. No acute maxillofacial fracture is identified. There is mild right maxillary sinus mucosal thickening. Trace bilateral mastoid air cell opacification is noted. The patient is edentulous aside from a single diseased left maxillary molar. Mild carotid siphon calcification is noted. Prior left cataract extraction is noted. Carotid bifurcation calcification is partially visualized.  IMPRESSION: 1. No evidence of acute intracranial abnormality. 2. Chronic occipital lobe infarcts. 3. Left facial soft tissue swelling without acute fracture identified.   Electronically Signed   By: Logan Bores   On: 05/09/2014 07:42   Ct Maxillofacial Wo Cm  05/09/2014   CLINICAL DATA:  Fall last night hitting concrete. Bruising and swelling to left eye and left cheek with diminished vision. No loss of consciousness. Initial encounter.  EXAM: CT HEAD WITHOUT CONTRAST  CT MAXILLOFACIAL WITHOUT CONTRAST  TECHNIQUE: Multidetector CT imaging of the head and maxillofacial structures were performed using the standard protocol without intravenous contrast. Multiplanar CT image reconstructions of the maxillofacial structures were also generated.  COMPARISON:  Head MRI 02/28/2010 and CT 11/21/2008  FINDINGS: CT HEAD FINDINGS  Encephalomalacia is again seen in the occipital lobes consistent with chronic infarcts with ex vacuo dilatation of the right greater than left occipital horns of lateral ventricles, overall stable to mildly progressed compared to the prior CT. Patchy hypodensities in the cerebral white matter elsewhere are nonspecific but compatible with mild chronic small vessel ischemic disease. There is no evidence of acute cortical infarct, intracranial hemorrhage, mass, midline shift, or extra-axial fluid collection.  Prior left cataract extraction is noted. No acute skull fracture is identified.  CT MAXILLOFACIAL FINDINGS  There is moderate soft tissue swelling involving the left cheek and left periorbital soft tissues. No acute maxillofacial fracture is identified. There is mild right maxillary sinus mucosal thickening. Trace bilateral mastoid air cell opacification is noted. The patient is edentulous aside from a single diseased left maxillary molar. Mild carotid siphon calcification is noted. Prior left cataract extraction is noted. Carotid bifurcation calcification is partially visualized.  IMPRESSION: 1. No evidence of acute intracranial abnormality. 2. Chronic occipital lobe infarcts. 3. Left facial soft tissue swelling without acute fracture identified.   Electronically Signed   By: Logan Bores  On: 05/09/2014 07:42     EKG Interpretation   Date/Time:  Monday May 09 2014 01:12:31 EST Ventricular Rate:  102 PR Interval:  176 QRS Duration: 92 QT Interval:  360 QTC Calculation: 469 R Axis:   91 Text Interpretation:  Sinus tachycardia Rightward axis Borderline ECG No  significant change was found Confirmed by [facility]  MD, TREY ([id number]) on  05/09/2014 7:21:36 AM      6:15 AM Patient seen and examined. Work-up initiated. Medications ordered.   Vital signs reviewed and are as follows: BP 145/101 mmHg  Pulse 112  Temp(Src) 99.4 F (37.4 C) (Oral)  Resp 15  Ht 5' 7.5" (1.715 m)  Wt 147 lb (66.679 kg)  BMI 22.67 kg/m2  SpO2 100%  7:57 AM Exam stable. Pt and stepson informed of CT results. Discussed with Dr. Doy Mince who will see. Pt has pain medication at home.   Patient was counseled on head injury precautions and symptoms that should indicate their return to the ED.  These include severe worsening headache, vision changes, confusion, loss of consciousness, trouble walking, nausea & vomiting, or weakness/tingling in extremities.    Pt counseled on wound care. Pt urged to return with  worsening pain, worsening swelling, expanding area of redness or streaking up extremity, fever, or any other concerns. Pt verbalizes understanding and agrees with plan.  8:41 AM Pt seen by Dr. Doy Mince. D/c to home.   BP 128/78 mmHg  Pulse 72  Temp(Src) 99.2 F (37.3 C) (Oral)  Resp 16  Ht 5' 7.5" (1.715 m)  Wt 147 lb (66.679 kg)  BMI 22.67 kg/m2  SpO2 96%   MDM   Final diagnoses:  Fall  Head injury, initial encounter  Contusion of face, initial encounter  Abrasions of multiple sites   Patient s/p fall. States that he slipped on grass causing the fall. He does not appear intoxicated and appears reliable. He has a normal neurological exam. Head injury > 12 hrs ago and exam is unchanged during my exams in ED. He has no neck pain or pain with movement of head and I do not suspect c-spine injury. Imaging of head and face negative for significant injury. Pain controlled. Pt appears well. No CP, SOB, chest or abdominal pain. No abdominal tenderness. Feel safe for d/c to home.     Carlisle Cater, PA-C 05/09/14 [place], MD 05/10/14 [id number]

## 2014-08-23 ENCOUNTER — Ambulatory Visit: Payer: Medicare Other | Attending: Psychology | Admitting: Psychology

## 2016-02-28 ENCOUNTER — Emergency Department (HOSPITAL_COMMUNITY): Payer: Medicare Other

## 2016-02-28 ENCOUNTER — Inpatient Hospital Stay (HOSPITAL_COMMUNITY)
Admission: EM | Admit: 2016-02-28 | Discharge: 2016-03-01 | DRG: 684 | Disposition: A | Discharge status: Home / Self Care | Payer: Medicare Other | Attending: Internal Medicine | Admitting: Internal Medicine

## 2016-02-28 ENCOUNTER — Encounter (HOSPITAL_COMMUNITY): Payer: Self-pay | Admitting: Emergency Medicine

## 2016-02-28 DIAGNOSIS — Y92480 Sidewalk as the place of occurrence of the external cause: Secondary | ICD-10-CM

## 2016-02-28 DIAGNOSIS — K769 Liver disease, unspecified: Secondary | ICD-10-CM | POA: Diagnosis present

## 2016-02-28 DIAGNOSIS — J45901 Unspecified asthma with (acute) exacerbation: Secondary | ICD-10-CM | POA: Diagnosis present

## 2016-02-28 DIAGNOSIS — R55 Syncope and collapse: Secondary | ICD-10-CM | POA: Diagnosis present

## 2016-02-28 DIAGNOSIS — R05 Cough: Secondary | ICD-10-CM | POA: Diagnosis not present

## 2016-02-28 DIAGNOSIS — K8681 Exocrine pancreatic insufficiency: Secondary | ICD-10-CM | POA: Diagnosis present

## 2016-02-28 DIAGNOSIS — Y9301 Activity, walking, marching and hiking: Secondary | ICD-10-CM | POA: Diagnosis present

## 2016-02-28 DIAGNOSIS — N179 Acute kidney failure, unspecified: Principal | ICD-10-CM | POA: Diagnosis present

## 2016-02-28 DIAGNOSIS — Z79899 Other long term (current) drug therapy: Secondary | ICD-10-CM

## 2016-02-28 DIAGNOSIS — F17210 Nicotine dependence, cigarettes, uncomplicated: Secondary | ICD-10-CM | POA: Diagnosis present

## 2016-02-28 DIAGNOSIS — R059 Cough, unspecified: Secondary | ICD-10-CM

## 2016-02-28 DIAGNOSIS — R000 Tachycardia, unspecified: Secondary | ICD-10-CM | POA: Diagnosis present

## 2016-02-28 DIAGNOSIS — I951 Orthostatic hypotension: Secondary | ICD-10-CM | POA: Diagnosis present

## 2016-02-28 DIAGNOSIS — I10 Essential (primary) hypertension: Secondary | ICD-10-CM | POA: Diagnosis present

## 2016-02-28 DIAGNOSIS — F418 Other specified anxiety disorders: Secondary | ICD-10-CM | POA: Diagnosis present

## 2016-02-28 DIAGNOSIS — W19XXXA Unspecified fall, initial encounter: Secondary | ICD-10-CM | POA: Diagnosis present

## 2016-02-28 DIAGNOSIS — J4520 Mild intermittent asthma, uncomplicated: Secondary | ICD-10-CM

## 2016-02-28 DIAGNOSIS — W1830XA Fall on same level, unspecified, initial encounter: Secondary | ICD-10-CM | POA: Diagnosis present

## 2016-02-28 DIAGNOSIS — R197 Diarrhea, unspecified: Secondary | ICD-10-CM | POA: Diagnosis present

## 2016-02-28 DIAGNOSIS — F329 Major depressive disorder, single episode, unspecified: Secondary | ICD-10-CM | POA: Diagnosis present

## 2016-02-28 DIAGNOSIS — J45909 Unspecified asthma, uncomplicated: Secondary | ICD-10-CM | POA: Diagnosis present

## 2016-02-28 DIAGNOSIS — C229 Malignant neoplasm of liver, not specified as primary or secondary: Secondary | ICD-10-CM

## 2016-02-28 DIAGNOSIS — K8689 Other specified diseases of pancreas: Secondary | ICD-10-CM | POA: Diagnosis present

## 2016-02-28 DIAGNOSIS — F419 Anxiety disorder, unspecified: Secondary | ICD-10-CM | POA: Diagnosis present

## 2016-02-28 DIAGNOSIS — E860 Dehydration: Secondary | ICD-10-CM | POA: Diagnosis present

## 2016-02-28 DIAGNOSIS — F1010 Alcohol abuse, uncomplicated: Secondary | ICD-10-CM

## 2016-02-28 DIAGNOSIS — Z720 Tobacco use: Secondary | ICD-10-CM | POA: Diagnosis present

## 2016-02-28 HISTORY — DX: Alcohol abuse, uncomplicated: F10.10

## 2016-02-28 HISTORY — DX: Other specified anxiety disorders: F41.8

## 2016-02-28 HISTORY — DX: Tobacco use: Z72.0

## 2016-02-28 HISTORY — DX: Gastrointestinal hemorrhage, unspecified: K92.2

## 2016-02-28 LAB — CBC WITH DIFFERENTIAL/PLATELET
BASOS PCT: 0 %
Basophils Absolute: 0 10*3/uL (ref 0.0–0.1)
EOS ABS: 0.1 10*3/uL (ref 0.0–0.7)
Eosinophils Relative: 1 %
HCT: 33.8 % — ABNORMAL LOW (ref 39.0–52.0)
HEMOGLOBIN: 11.5 g/dL — AB (ref 13.0–17.0)
LYMPHS ABS: 1.4 10*3/uL (ref 0.7–4.0)
Lymphocytes Relative: 15 %
MCH: 29.8 pg (ref 26.0–34.0)
MCHC: 34 g/dL (ref 30.0–36.0)
MCV: 87.6 fL (ref 78.0–100.0)
MONO ABS: 0.9 10*3/uL (ref 0.1–1.0)
Monocytes Relative: 10 %
Neutro Abs: 7.3 10*3/uL (ref 1.7–7.7)
Neutrophils Relative %: 74 %
Platelets: 171 10*3/uL (ref 150–400)
RBC: 3.86 MIL/uL — ABNORMAL LOW (ref 4.22–5.81)
RDW: 12.9 % (ref 11.5–15.5)
WBC: 9.8 10*3/uL (ref 4.0–10.5)

## 2016-02-28 LAB — URINALYSIS, ROUTINE W REFLEX MICROSCOPIC
BILIRUBIN URINE: NEGATIVE
Glucose, UA: NEGATIVE mg/dL
HGB URINE DIPSTICK: NEGATIVE
KETONES UR: 15 mg/dL — AB
Leukocytes, UA: NEGATIVE
NITRITE: NEGATIVE
Protein, ur: NEGATIVE mg/dL
Specific Gravity, Urine: 1.013 (ref 1.005–1.030)
pH: 6 (ref 5.0–8.0)

## 2016-02-28 LAB — I-STAT CG4 LACTIC ACID, ED
LACTIC ACID, VENOUS: 1.76 mmol/L (ref 0.5–1.9)
LACTIC ACID, VENOUS: 1.23 mmol/L (ref 0.5–1.9)

## 2016-02-28 LAB — COMPREHENSIVE METABOLIC PANEL
ALBUMIN: 3.2 g/dL — AB (ref 3.5–5.0)
ALK PHOS: 69 U/L (ref 38–126)
ALT: 16 U/L — ABNORMAL LOW (ref 17–63)
AST: 21 U/L (ref 15–41)
Anion gap: 8 (ref 5–15)
BILIRUBIN TOTAL: 0.9 mg/dL (ref 0.3–1.2)
BUN: 15 mg/dL (ref 6–20)
CALCIUM: 8.6 mg/dL — AB (ref 8.9–10.3)
CO2: 25 mmol/L (ref 22–32)
Chloride: 103 mmol/L (ref 101–111)
Creatinine, Ser: 1.34 mg/dL — ABNORMAL HIGH (ref 0.61–1.24)
GFR calc non Af Amer: 51 mL/min — ABNORMAL LOW (ref >60)
GFR, EST AFRICAN AMERICAN: 59 mL/min — AB (ref >60)
Glucose, Bld: 108 mg/dL — ABNORMAL HIGH (ref 65–99)
Potassium: 3.8 mmol/L (ref 3.5–5.1)
SODIUM: 136 mmol/L (ref 135–145)
TOTAL PROTEIN: 5.7 g/dL — AB (ref 6.5–8.1)

## 2016-02-28 LAB — LIPASE, BLOOD: Lipase: 25 U/L (ref 11–51)

## 2016-02-28 MED ORDER — IOPAMIDOL (ISOVUE-300) INJECTION 61%
INTRAVENOUS | Status: AC
  Filled 2016-02-28: qty 75

## 2016-02-28 MED ORDER — LACTATED RINGERS IV BOLUS (SEPSIS)
1000.0000 mL | Freq: Once | INTRAVENOUS | Status: AC
  Administered 2016-02-28: 1000 mL via INTRAVENOUS

## 2016-02-28 MED ORDER — SODIUM CHLORIDE 0.9 % IV BOLUS (SEPSIS)
1000.0000 mL | Freq: Once | INTRAVENOUS | Status: AC
  Administered 2016-02-28: 1000 mL via INTRAVENOUS

## 2016-02-28 MED ORDER — IOPAMIDOL (ISOVUE-300) INJECTION 61%
75.0000 mL | Freq: Once | INTRAVENOUS | Status: AC | PRN
  Administered 2016-02-28: 75 mL via INTRAVENOUS

## 2016-02-28 NOTE — ED Notes (Addendum)
Pt says he hit his head but no abrasions seen and appears not to have hematoma.  There was blood however on sheet from EMS.

## 2016-02-28 NOTE — ED Notes (Signed)
Pt told secretary he need assistance for emergency BM. When I enter the room Pt was squatting over the trash can.  There was yellow semi formed feces on the ground and in the trash can.

## 2016-02-28 NOTE — ED Notes (Signed)
Pt transported to CT ?

## 2016-02-28 NOTE — ED Notes (Signed)
Pt defecated and urinated all over floor. Pt cleaned, linens changed, and environmental cleaned the floor.

## 2016-02-28 NOTE — ED Provider Notes (Signed)
[facility] DEPT Provider Note   CSN: [id number] Arrival date & time: 02/28/16  1633     History   Chief Complaint Chief Complaint  Patient presents with  . Fall    HPI RADWAN LONE is a 75 y.o. male.  HPI 75 year old male with past medical history of asthma and hypertension who presents with fall while walking. The patient states that over the last 4 days, he has had profuse, nonbloody, watery diarrhea. His diarrhea finally started to improve today so he was walking outside. He states that he fell mildly lightheaded while walking and then loss consciousness. Denies any preceding chest pain. EMS was flagged down by someone who found the patient on the ground. On arrival, patient was reportedly with heart rate 120s and blood pressure 90 systolic. They sat him up and his heart rate went to the 0000000 with systolic in the [id number]. He was given an IV and 300 mL of normal saline. Currently, patient states that he feels fatigued but denies any specific complaints. Denies any abdominal pain. Denies any preceding known fevers. He does endorse lightheadedness with sitting up.  Past Medical History:  Diagnosis Date  . Alcohol abuse   . Asthma   . Depression with anxiety   . GIB (gastrointestinal bleeding)   . Hypertension   . Tobacco abuse     Patient Active Problem List   Diagnosis Date Noted  . Pancreatic mass 02/29/2016  . Tobacco abuse 02/29/2016  . Depression with anxiety 02/29/2016  . Fall 02/29/2016  . Syncope 02/29/2016  . Diarrhea 02/29/2016  . AKI (acute kidney injury) ([place]) 02/29/2016  . Hypertension   . Asthma   . Alcohol abuse     Past Surgical History:  Procedure Laterality Date  . bleeding ulcer         Home Medications    Prior to Admission medications   Medication Sig Start Date End Date Taking? Authorizing Provider  albuterol (PROVENTIL HFA;VENTOLIN HFA) 108 (90 BASE) MCG/ACT inhaler Inhale 2 puffs into the lungs every 6 (six) hours as needed for  wheezing or shortness of breath.   Yes Historical Provider, MD  ALPRAZolam Duanne Moron) 1 MG tablet Take 0.5-1 mg by mouth 2 (two) times daily as needed for anxiety. Anxiety    Yes Historical Provider, MD  amphetamine-dextroamphetamine (ADDERALL) 10 MG tablet Take 15 mg by mouth every morning.    Yes Historical Provider, MD  cetirizine (ZYRTEC) 10 MG tablet Take 10 mg by mouth at bedtime.   Yes Historical Provider, MD  FLUoxetine (PROZAC) 40 MG capsule Take 40 mg by mouth daily with breakfast.     Yes Historical Provider, MD  HYDROcodone-acetaminophen (NORCO) 10-325 MG tablet Take 1 tablet by mouth daily as needed for moderate pain.  02/06/16  Yes Historical Provider, MD  LUTEIN PO Take 1 tablet by mouth daily.   Yes Historical Provider, MD  Multiple Vitamins-Minerals (MULTIVITAMIN WITH MINERALS) tablet Take 1 tablet by mouth daily.    Yes Historical Provider, MD  OVER THE COUNTER MEDICATION Place 2 sprays into both nostrils as needed (for congestion). [facility] Nasal Spray for congestion   Yes Historical Provider, MD  valsartan-hydrochlorothiazide (DIOVAN-HCT) 80-12.5 MG tablet Take 1 tablet by mouth daily. 02/18/16  Yes Historical Provider, MD  doxycycline (VIBRAMYCIN) 100 MG capsule Take 1 capsule (100 mg total) by mouth 2 (two) times daily. Patient not taking: Reported on 02/29/2016 03/05/12   Shanon Rosser, MD  HYDROcodone-homatropine [facility]) 5-1.5 MG/5ML syrup Take 5 mLs by mouth every  4 (four) hours as needed for cough or pain. Patient not taking: Reported on 02/29/2016 03/05/12   Shanon Rosser, MD    Family History Family History  Problem Relation Age of Onset  . Stroke Mother     Social History Social History  Substance Use Topics  . Smoking status: Current Every Day Smoker    Packs/day: 0.50    Years: 40.00    Types: Cigarettes  . Smokeless tobacco: Never Used  . Alcohol use Not on file     Allergies   Review of patient's allergies indicates no known allergies.   Review of  Systems Review of Systems  Constitutional: Positive for fatigue. Negative for chills and fever.  HENT: Negative for congestion and rhinorrhea.   Eyes: Negative for visual disturbance.  Respiratory: Negative for cough, shortness of breath and wheezing.   Cardiovascular: Negative for chest pain and leg swelling.  Gastrointestinal: Positive for diarrhea and nausea. Negative for abdominal pain and vomiting.  Genitourinary: Negative for dysuria and flank pain.  Musculoskeletal: Negative for neck pain and neck stiffness.  Skin: Negative for rash and wound.  Allergic/Immunologic: Negative for immunocompromised state.  Neurological: Positive for dizziness, weakness and light-headedness. Negative for syncope and headaches.  All other systems reviewed and are negative.    Physical Exam Updated Vital Signs BP (!) 151/98 (BP Location: Right Arm)   Pulse (!) 104   Temp 99.1 F (37.3 C) (Oral)   Resp 18   Ht 5\' 7"  (1.702 m)   Wt 160 lb (72.6 kg)   SpO2 100%   BMI 25.06 kg/m   Physical Exam  Constitutional: He is oriented to person, place, and time. He appears well-developed and well-nourished. No distress.  HENT:  Head: Normocephalic and atraumatic.  Dry mucous membranes  Eyes: Conjunctivae are normal. Pupils are equal, round, and reactive to light.  Neck: Neck supple.  Cardiovascular: Regular rhythm and normal heart sounds.  Tachycardia present.  Exam reveals no friction rub.   No murmur heard. Pulmonary/Chest: Effort normal and breath sounds normal. No respiratory distress. He has no wheezes. He has no rales.  Abdominal: Soft. Bowel sounds are normal. He exhibits no distension. There is no tenderness. There is no rebound and no guarding.  Musculoskeletal: He exhibits no edema.  Neurological: He is alert and oriented to person, place, and time. He exhibits normal muscle tone.  Skin: Skin is warm. Capillary refill takes less than 2 seconds.  Psychiatric: He has a normal mood and affect.   Nursing note and vitals reviewed.    ED Treatments / Results  Labs (all labs ordered are listed, but only abnormal results are displayed) Labs Reviewed  CBC WITH DIFFERENTIAL/PLATELET - Abnormal; Notable for the following:       Result Value   RBC 3.86 (*)    Hemoglobin 11.5 (*)    HCT 33.8 (*)    All other components within normal limits  COMPREHENSIVE METABOLIC PANEL - Abnormal; Notable for the following:    Glucose, Bld 108 (*)    Creatinine, Ser 1.34 (*)    Calcium 8.6 (*)    Total Protein 5.7 (*)    Albumin 3.2 (*)    ALT 16 (*)    GFR calc non Af Amer 51 (*)    GFR calc Af Amer 59 (*)    All other components within normal limits  URINALYSIS, ROUTINE W REFLEX MICROSCOPIC (NOT AT [facility]) - Abnormal; Notable for the following:    Ketones, ur 15 (*)  All other components within normal limits  APTT - Abnormal; Notable for the following:    aPTT 44 (*)    All other components within normal limits  COMPREHENSIVE METABOLIC PANEL - Abnormal; Notable for the following:    Glucose, Bld 114 (*)    Calcium 8.4 (*)    Total Protein 5.6 (*)    Albumin 3.2 (*)    All other components within normal limits  CBC - Abnormal; Notable for the following:    RBC 4.01 (*)    Hemoglobin 12.0 (*)    HCT 35.3 (*)    All other components within normal limits  GLUCOSE, CAPILLARY - Abnormal; Notable for the following:    Glucose-Capillary 132 (*)    All other components within normal limits  C DIFFICILE QUICK SCREEN W PCR REFLEX  GASTROINTESTINAL PANEL BY PCR, STOOL (REPLACES STOOL CULTURE)  LIPASE, BLOOD  CREATININE, URINE, RANDOM  PROTIME-INR  GLUCOSE, CAPILLARY  CANCER ANTIGEN 19-9  UREA NITROGEN, URINE  CEA  I-STAT CG4 LACTIC ACID, ED  I-STAT CG4 LACTIC ACID, ED    EKG  EKG Interpretation  Date/Time:  Wednesday February 28 2016 16:46:49 EDT Ventricular Rate:  116 PR Interval:    QRS Duration: 103 QT Interval:  375 QTC Calculation: 521 R Axis:   169 Text  Interpretation:  Sinus tachycardia Ventricular premature complex S1,S2,S3 pattern Prolonged QT interval Artifact in lead(s) I III aVL No significant change since last tracing Confirmed by LITTLE MD, RACHEL [phone]) on 02/29/2016 2:34:12 PM       Radiology Dg Chest 2 View  Result Date: 02/29/2016 CLINICAL DATA:  Initial valuation for acute cough. EXAM: CHEST  2 VIEW COMPARISON:  None. FINDINGS: Cardiac and mediastinal silhouettes are within normal limits. Lungs normally inflated. No focal trait, pulmonary edema, or pleural effusion. No pneumothorax. No acute osseous abnormality. IMPRESSION: No active cardiopulmonary disease. Electronically Signed   By: Jeannine Boga M.D.   On: 02/29/2016 03:07   Ct Head Wo Contrast  Result Date: 02/28/2016 CLINICAL DATA:  Hit top of head now with small hematoma. EXAM: CT HEAD WITHOUT CONTRAST TECHNIQUE: Contiguous axial images were obtained from the base of the skull through the vertex without intravenous contrast. COMPARISON:  05/09/2014 FINDINGS: Brain: Similar findings of advanced atrophy with sulcal prominence centralized volume loss with commensurate ex vacuo dilatation of the ventricular system. Stable sequela of prior right PCA distribution infarct with associated asymmetric ex vacuo dilatation involving the occipital horn of the right lateral ventricle. Scattered minimal periventricular hypodensities compatible microvascular ischemic disease. Given extensive background parenchymal abnormalities, there is no CT evidence superimposed acute large territory infarct. Punctate (approximately 3 mm) suspected meningioma about the inner table of the right frontal cortex (coronal image 26, series 205). Otherwise, no intraparenchymal or extra-axial mass. No intraparenchymal or extra-axial hemorrhage. Unchanged size and configuration of the ventricles and the basilar cisterns. No midline shift. Vascular: Intracranial atherosclerosis. Skull: No displaced calvarial  fracture. Sinuses/Orbits: There is underpneumatization the bilateral frontal sinuses. A tooth is again noted to be impacted within the caudal aspect of the left maxillary sinus. The remaining paranasal sinuses and mastoid air cells are normally aerated. No air-fluid levels. Post left-sided cataract surgery. Other: Unchanged dermal calcification is noted about the right parietal calvarium (image 19, series 205). Regional soft tissues appear normal. No radiopaque foreign body. 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 Electronically Signed   By: Eldridge Abrahams.D.  On: 02/28/2016 17:51   Ct Abdomen Pelvis W Contrast  Result Date: 02/29/2016 CLINICAL DATA:  75 year old male with diarrhea and diffuse abdominal pain. EXAM: CT ABDOMEN AND PELVIS WITH CONTRAST TECHNIQUE: Multidetector CT imaging of the abdomen and pelvis was performed using the standard protocol following bolus administration of intravenous contrast. CONTRAST:  30mL ISOVUE-300 IOPAMIDOL (ISOVUE-300) INJECTION 61% COMPARISON:  None. FINDINGS: Lower chest: The visualized lung bases are clear. No intra-abdominal free air.  Trace perihepatic free fluid. Hepatobiliary: There are multiple hepatic hypodense lesions which demonstrate somewhat ill-defined margins. The largest lesion is in the dome of the liver and measures 2.0 x 1.5 cm. These lesions are not well characterized but are concerning for metastatic disease. Further evaluation with MRI without and with contrast is recommended. Mild periportal edema. No biliary ductal dilatation. The gallbladder is mildly distended. No calcified gallstone identified. Pancreas: There is a 3.5 x 2.8 x 2.8 cm hypoenhancing mass in the head and uncinate process of the pancreas. There is associated gland atrophy with mild dilatation of the main pancreatic duct. Spleen: Choose 1 Adrenals/Urinary Tract: The  adrenal glands appear unremarkable. Mild bilateral renal atrophy. Subcentimeter right renal upper pole hypodense lesion is not well characterized but most likely represents a cyst. There is no hydronephrosis on either side. The visualized ureters appear unremarkable. There is thickened and trabecular appearance of the urinary bladder likely related to chronic bladder outlet obstruction. Correlation with urinalysis recommended to exclude UTI. Stomach/Bowel: There is sigmoid diverticulosis with muscular hypertrophy. Underlying colonic mass is not excluded. Follow-up with colonoscopy recommended. Mild haziness of the sigmoid colon may be chronic or represent mild diverticulitis. Scattered colonic diverticula noted. There is no evidence of bowel obstruction. Normal appendix. Vascular/Lymphatic: There is moderate aortoiliac atherosclerotic disease. The abdominal aorta and IVC are otherwise unremarkable. The origins of the celiac axis, SMA, IMA as well as the origins of the renal arteries appear patent. There is no infiltration of the fat surrounding the celiac axis or SMA. The SMV, splenic vein, and main portal vein are patent. No portal venous gas identified. A nodular density measuring 7 mm in short anterior to the pancreatic mass may represent a slightly enlarged lymph node or a satellite lesion. No other adenopathy identified. A smaller nodular density noted posterior to the pancreatic mass and anterior to the IVC (series 2, image 24). Reproductive: Heterogeneous prostate gland. Other: There is loss of subcutaneous fat predominantly involving the lower chest and cachexia. Musculoskeletal: Degenerative changes of the spine. Disc desiccation with vacuum phenomenon in the lower lumbar spine. No acute fracture. 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 Further evaluation with MRI without and  with contrast and MRCP recommended. Small nodular density anterior to the pancreatic mass is concerning for satellite lesions. Multiple hepatic hypodense lesions most compatible with metastatic disease. No evidence of celiac or SMA encasement. Sigmoid diverticulosis with muscular hypertrophy. Mild sigmoid diverticulitis is not excluded. No bowel obstruction. Normal appendix. Follow-up with nonemergent colonoscopy recommended to exclude underlying sigmoid mass. Electronically Signed   By: Anner Crete M.D.   On: 02/29/2016 00:20    Procedures Procedures (including critical care time)  Medications Ordered in ED Medications  atorvastatin (LIPITOR) tablet 20 mg (20 mg Oral Given 02/29/16 1038)  multivitamin with minerals tablet 1 tablet (1 tablet Oral Given 02/29/16 1030)  HYDROcodone-homatropine (HYCODAN) 5-1.5 MG/5ML syrup 5 mL (not administered)  ALPRAZolam (XANAX) tablet 0.5-1 mg (  not administered)  FLUoxetine (PROZAC) capsule 40 mg (40 mg Oral Given 02/29/16 1030)  HYDROcodone-acetaminophen (NORCO) 10-325 MG per tablet 1 tablet (not administered)  mirtazapine (REMERON) tablet 45 mg (not administered)  temazepam (RESTORIL) capsule 30 mg (not administered)  hydrALAZINE (APRESOLINE) injection 5 mg (not administered)  0.9 %  sodium chloride infusion ( Intravenous New Bag/Given 02/29/16 0331)  ondansetron (ZOFRAN) injection 4 mg (not administered)  albuterol (PROVENTIL) (2.5 MG/3ML) 0.083% nebulizer solution 2.5 mg (not administered)  nicotine (NICODERM CQ - dosed in mg/24 hours) patch 21 mg (21 mg Transdermal Not Given 02/29/16 1031)  sodium chloride flush (NS) 0.9 % injection 3 mL (3 mLs Intravenous Given 02/29/16 1038)  amphetamine-dextroamphetamine (ADDERALL) tablet 15 mg (15 mg Oral Given 02/29/16 1032)  sodium chloride 0.9 % bolus 1,000 mL (0 mLs Intravenous Stopped 02/28/16 2008)  sodium chloride 0.9 % bolus 1,000 mL (0 mLs Intravenous Stopped 02/28/16 1830)  lactated ringers bolus  1,000 mL (0 mLs Intravenous Stopped 02/28/16 2131)  iopamidol (ISOVUE-300) 61 % injection 75 mL (75 mLs Intravenous Contrast Given 02/28/16 2317)     Initial Impression / Assessment and Plan / ED Course  I have reviewed the triage vital signs and the nursing notes.  Pertinent labs & imaging results that were available during my care of the patient were reviewed by me and considered in my medical decision making (see chart for details).  Clinical Course    75 yo M with PMHx of chronic alcoholism who p/w a several week h/o severe diarrhea followed by syncopal episode while walking today. On arrival, pt tachycardic and markedly orthostatic. Hypovolemic on exam. Suspect orthostatic syncope 2/2 profound hypovolemia in setting of uncontrolled diarrhea. EKG non-ischemic and without arrhythmia. Labs overall reassuring but pt persistently tachycardic and noted to have severe diarrhea in ED. CT scan subsequently obtained and is c/f pancreatic adenoCA. Concern for possible VIPoma given degree of diarrhea. Will admit for continued IVF, further work-up. Pt in agreement and notified of CT findings.  Final Clinical Impressions(s) / ED Diagnoses   Final diagnoses:  Essential hypertension  Mild intermittent asthma without complication  Alcohol abuse  Cough  Mass of pancreas  Diarrhea, unspecified type    New Prescriptions Current Discharge Medication List       Duffy Bruce, MD 02/29/16 1445

## 2016-02-28 NOTE — ED Triage Notes (Signed)
Per EMS: They were driving down [address] and witness Reginald Ochoa fall while he was walking. Pt has a Hx of ortho stasis and hx of fall. Pt hit head and does not know if LOC occurred. Small hematoma on right side.  Pt say "might have blacked out but only for a second"  Pt is complaining of diarrhea x4 days tapering off today. 18g in LAC and pt has received 300cc of NS. Pt not on blood thinner.

## 2016-02-29 ENCOUNTER — Inpatient Hospital Stay (HOSPITAL_COMMUNITY): Payer: Medicare Other

## 2016-02-29 ENCOUNTER — Encounter (HOSPITAL_COMMUNITY): Payer: Self-pay | Admitting: Internal Medicine

## 2016-02-29 DIAGNOSIS — E860 Dehydration: Secondary | ICD-10-CM | POA: Diagnosis present

## 2016-02-29 DIAGNOSIS — I10 Essential (primary) hypertension: Secondary | ICD-10-CM | POA: Diagnosis present

## 2016-02-29 DIAGNOSIS — J45901 Unspecified asthma with (acute) exacerbation: Secondary | ICD-10-CM | POA: Diagnosis present

## 2016-02-29 DIAGNOSIS — K869 Disease of pancreas, unspecified: Secondary | ICD-10-CM

## 2016-02-29 DIAGNOSIS — J4520 Mild intermittent asthma, uncomplicated: Secondary | ICD-10-CM

## 2016-02-29 DIAGNOSIS — R05 Cough: Secondary | ICD-10-CM | POA: Diagnosis present

## 2016-02-29 DIAGNOSIS — N179 Acute kidney failure, unspecified: Secondary | ICD-10-CM | POA: Diagnosis present

## 2016-02-29 DIAGNOSIS — F17210 Nicotine dependence, cigarettes, uncomplicated: Secondary | ICD-10-CM | POA: Diagnosis present

## 2016-02-29 DIAGNOSIS — R197 Diarrhea, unspecified: Secondary | ICD-10-CM

## 2016-02-29 DIAGNOSIS — I951 Orthostatic hypotension: Secondary | ICD-10-CM | POA: Diagnosis present

## 2016-02-29 DIAGNOSIS — W19XXXA Unspecified fall, initial encounter: Secondary | ICD-10-CM

## 2016-02-29 DIAGNOSIS — W1830XA Fall on same level, unspecified, initial encounter: Secondary | ICD-10-CM | POA: Diagnosis present

## 2016-02-29 DIAGNOSIS — F1010 Alcohol abuse, uncomplicated: Secondary | ICD-10-CM

## 2016-02-29 DIAGNOSIS — Z720 Tobacco use: Secondary | ICD-10-CM | POA: Diagnosis present

## 2016-02-29 DIAGNOSIS — F418 Other specified anxiety disorders: Secondary | ICD-10-CM

## 2016-02-29 DIAGNOSIS — R000 Tachycardia, unspecified: Secondary | ICD-10-CM | POA: Diagnosis present

## 2016-02-29 DIAGNOSIS — R55 Syncope and collapse: Secondary | ICD-10-CM | POA: Diagnosis present

## 2016-02-29 DIAGNOSIS — J45909 Unspecified asthma, uncomplicated: Secondary | ICD-10-CM | POA: Diagnosis present

## 2016-02-29 DIAGNOSIS — K769 Liver disease, unspecified: Secondary | ICD-10-CM | POA: Diagnosis present

## 2016-02-29 DIAGNOSIS — F419 Anxiety disorder, unspecified: Secondary | ICD-10-CM | POA: Diagnosis present

## 2016-02-29 DIAGNOSIS — K8689 Other specified diseases of pancreas: Secondary | ICD-10-CM | POA: Diagnosis present

## 2016-02-29 DIAGNOSIS — Y92480 Sidewalk as the place of occurrence of the external cause: Secondary | ICD-10-CM | POA: Diagnosis not present

## 2016-02-29 DIAGNOSIS — K8681 Exocrine pancreatic insufficiency: Secondary | ICD-10-CM | POA: Diagnosis present

## 2016-02-29 DIAGNOSIS — Y9301 Activity, walking, marching and hiking: Secondary | ICD-10-CM | POA: Diagnosis present

## 2016-02-29 DIAGNOSIS — F329 Major depressive disorder, single episode, unspecified: Secondary | ICD-10-CM | POA: Diagnosis present

## 2016-02-29 DIAGNOSIS — Z79899 Other long term (current) drug therapy: Secondary | ICD-10-CM | POA: Diagnosis not present

## 2016-02-29 LAB — COMPREHENSIVE METABOLIC PANEL
ALBUMIN: 3.2 g/dL — AB (ref 3.5–5.0)
ALT: 17 U/L (ref 17–63)
ANION GAP: 6 (ref 5–15)
AST: 23 U/L (ref 15–41)
Alkaline Phosphatase: 67 U/L (ref 38–126)
BILIRUBIN TOTAL: 0.8 mg/dL (ref 0.3–1.2)
BUN: 10 mg/dL (ref 6–20)
CO2: 27 mmol/L (ref 22–32)
Calcium: 8.4 mg/dL — ABNORMAL LOW (ref 8.9–10.3)
Chloride: 106 mmol/L (ref 101–111)
Creatinine, Ser: 1.12 mg/dL (ref 0.61–1.24)
GFR calc Af Amer: >60 mL/min (ref >60)
Glucose, Bld: 114 mg/dL — ABNORMAL HIGH (ref 65–99)
POTASSIUM: 3.8 mmol/L (ref 3.5–5.1)
Sodium: 139 mmol/L (ref 135–145)
TOTAL PROTEIN: 5.6 g/dL — AB (ref 6.5–8.1)

## 2016-02-29 LAB — GLUCOSE, CAPILLARY
Glucose-Capillary: 132 mg/dL — ABNORMAL HIGH (ref 65–99)
Glucose-Capillary: 99 mg/dL (ref 65–99)

## 2016-02-29 LAB — CBC
HEMATOCRIT: 35.3 % — AB (ref 39.0–52.0)
HEMOGLOBIN: 12 g/dL — AB (ref 13.0–17.0)
MCH: 29.9 pg (ref 26.0–34.0)
MCHC: 34 g/dL (ref 30.0–36.0)
MCV: 88 fL (ref 78.0–100.0)
Platelets: 179 10*3/uL (ref 150–400)
RBC: 4.01 MIL/uL — AB (ref 4.22–5.81)
RDW: 13.1 % (ref 11.5–15.5)
WBC: 8 10*3/uL (ref 4.0–10.5)

## 2016-02-29 LAB — GASTROINTESTINAL PANEL BY PCR, STOOL (REPLACES STOOL CULTURE)

## 2016-02-29 LAB — C DIFFICILE QUICK SCREEN W PCR REFLEX
C DIFFICILE (CDIFF) INTERP: NOT DETECTED
C DIFFICILE (CDIFF) TOXIN: NEGATIVE
C Diff antigen: NEGATIVE

## 2016-02-29 LAB — CREATININE, URINE, RANDOM: CREATININE, URINE: 92.66 mg/dL

## 2016-02-29 LAB — PROTIME-INR
INR: 1.12
PROTHROMBIN TIME: 14.4 s (ref 11.4–15.2)

## 2016-02-29 LAB — APTT: aPTT: 44 seconds — ABNORMAL HIGH (ref 24–36)

## 2016-02-29 MED ORDER — NICOTINE 21 MG/24HR TD PT24
21.0000 mg | MEDICATED_PATCH | Freq: Every day | TRANSDERMAL | Status: DC
  Filled 2016-02-29: qty 1

## 2016-02-29 MED ORDER — ALPRAZOLAM 0.5 MG PO TABS: 0.5000 mg | ORAL_TABLET | Freq: Two times a day (BID) | ORAL | Status: DC | PRN

## 2016-02-29 MED ORDER — HYDRALAZINE HCL 20 MG/ML IJ SOLN: 5.0000 mg | INTRAMUSCULAR | Status: DC | PRN

## 2016-02-29 MED ORDER — ATORVASTATIN CALCIUM 20 MG PO TABS
20.0000 mg | ORAL_TABLET | Freq: Every day | ORAL | Status: DC
  Administered 2016-02-29 – 2016-03-01 (×2): 20 mg via ORAL
  Filled 2016-02-29 (×2): qty 1

## 2016-02-29 MED ORDER — FLUOXETINE HCL 20 MG PO CAPS
40.0000 mg | ORAL_CAPSULE | Freq: Every day | ORAL | Status: DC
  Administered 2016-02-29 – 2016-03-01 (×2): 40 mg via ORAL
  Filled 2016-02-29 (×2): qty 2

## 2016-02-29 MED ORDER — ALBUTEROL SULFATE (2.5 MG/3ML) 0.083% IN NEBU: 2.5000 mg | INHALATION_SOLUTION | RESPIRATORY_TRACT | Status: DC | PRN

## 2016-02-29 MED ORDER — ADULT MULTIVITAMIN W/MINERALS CH
1.0000 | ORAL_TABLET | Freq: Every day | ORAL | Status: DC
  Administered 2016-02-29 – 2016-03-01 (×2): 1 via ORAL
  Filled 2016-02-29 (×3): qty 1

## 2016-02-29 MED ORDER — AMPHETAMINE-DEXTROAMPHETAMINE 10 MG PO TABS: 15.0000 mg | ORAL_TABLET | ORAL | Status: DC

## 2016-02-29 MED ORDER — SODIUM CHLORIDE 0.9 % IV SOLN
INTRAVENOUS | Status: DC
  Administered 2016-02-29 – 2016-03-01 (×2): via INTRAVENOUS

## 2016-02-29 MED ORDER — ONDANSETRON HCL 4 MG/2ML IJ SOLN: 4.0000 mg | Freq: Three times a day (TID) | INTRAMUSCULAR | Status: DC | PRN

## 2016-02-29 MED ORDER — AMPHETAMINE-DEXTROAMPHETAMINE 10 MG PO TABS
15.0000 mg | ORAL_TABLET | Freq: Every day | ORAL | Status: DC
  Administered 2016-02-29 – 2016-03-01 (×2): 15 mg via ORAL
  Filled 2016-02-29 (×2): qty 2

## 2016-02-29 MED ORDER — MIRTAZAPINE 15 MG PO TABS
45.0000 mg | ORAL_TABLET | Freq: Every day | ORAL | Status: DC
  Administered 2016-02-29: 45 mg via ORAL
  Filled 2016-02-29: qty 1

## 2016-02-29 MED ORDER — HYDROCODONE-ACETAMINOPHEN 10-325 MG PO TABS: 1.0000 | ORAL_TABLET | Freq: Three times a day (TID) | ORAL | Status: DC | PRN

## 2016-02-29 MED ORDER — IOPAMIDOL (ISOVUE-300) INJECTION 61%
INTRAVENOUS | Status: AC
  Administered 2016-02-29: 75 mL
  Filled 2016-02-29: qty 75

## 2016-02-29 MED ORDER — TEMAZEPAM 15 MG PO CAPS
30.0000 mg | ORAL_CAPSULE | Freq: Every evening | ORAL | Status: DC | PRN
  Administered 2016-02-29: 30 mg via ORAL
  Filled 2016-02-29: qty 2

## 2016-02-29 MED ORDER — SODIUM CHLORIDE 0.9% FLUSH
3.0000 mL | Freq: Two times a day (BID) | INTRAVENOUS | Status: DC
  Administered 2016-02-29 – 2016-03-01 (×3): 3 mL via INTRAVENOUS

## 2016-02-29 MED ORDER — HYDROCODONE-HOMATROPINE 5-1.5 MG/5ML PO SYRP: 5.0000 mL | ORAL_SOLUTION | ORAL | Status: DC | PRN

## 2016-02-29 NOTE — Evaluation (Signed)
Physical Therapy Evaluation Patient Details Name: Reginald Ochoa MRN: [id number] DOB: [birth year] Today's Date: 02/29/2016   History of Present Illness  Patient is a 75 y/o male with hx of HTN, tobacco abuse, alcohol abuse, depression and asthma presents with diarrhea and syncope. Positive orthostatic vital signs in ED. CT abdomen/pelvis showed possible pancreatic cancer with possible liver metastasis  Clinical Impression  Patient presents with generalized weakness and balance deficits s/p above. Tolerated gait training with Min guard-Min A for balance/safety. Would likely benefit from UE support during ambulation. Will attempt cane next session. Pt is home alone during the day. Did not report any dizziness during mobility or change in position today. Will continue to follow to maximize independence and mobility and decrease fall risk prior to return home.    Follow Up Recommendations Home health PT;Supervision - Intermittent    Equipment Recommendations  None recommended by PT    Recommendations for Other Services OT consult     Precautions / Restrictions Precautions Precautions: Fall Restrictions Weight Bearing Restrictions: No      Mobility  Bed Mobility Overal bed mobility: Needs Assistance Bed Mobility: Supine to Sit     Supine to sit: Min guard;HOB elevated     General bed mobility comments: Increased time, use of rails for support. No dizziness.   Transfers Overall transfer level: Needs assistance Equipment used: None Transfers: Sit to/from Stand Sit to Stand: Min guard         General transfer comment: Min guard for safety. No dizziness in standing. Transferred to chair post ambulation.  Ambulation/Gait Ambulation/Gait assistance: Min assist;Min guard Ambulation Distance (Feet): 100 Feet Assistive device: None Gait Pattern/deviations: Step-through pattern;Decreased stride length;Drifts right/left Gait velocity: decreased Gait velocity interpretation:  Below normal speed for age/gender General Gait Details: Slow, unsteady gait with some instability noted, reaching for rail for support. HR up to 117 bpm.  Stairs            Wheelchair Mobility    Modified Rankin (Stroke Patients Only)       Balance Overall balance assessment: Needs assistance Sitting-balance support: Feet supported;No upper extremity supported Sitting balance-Leahy Scale: Good     Standing balance support: During functional activity Standing balance-Leahy Scale: Fair                               Pertinent Vitals/Pain Pain Assessment: No/denies pain    Home Living Family/patient expects to be discharged to:: Private residence Living Arrangements: Spouse/significant other;Children Available Help at Discharge: Family;Available PRN/intermittently (spouse and step son work.) Type of Home: House Home Access: Level entry     Home Layout: Two level;Able to live on main level with bedroom/bathroom Home Equipment: Other (comment) (wooden stick)      Prior Function Level of Independence: Independent with assistive device(s)         Comments: Uses walking stick as needed. Per step son, pt not supposed to leave the house without telling someone but does. Home alone during the day. 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     Hand Dominance   Dominant Hand: Right    Extremity/Trunk Assessment   Upper Extremity Assessment: Defer to OT evaluation           Lower Extremity Assessment: Generalized weakness         Communication   Communication: No difficulties  Cognition Arousal/Alertness: Awake/alert Behavior During Therapy: WFL for tasks assessed/performed Overall Cognitive Status: Difficult  to assess Area of Impairment: Safety/judgement         Safety/Judgement: Decreased awareness of safety;Decreased awareness of deficits          General Comments General comments (skin integrity, edema, etc.): Step son present during  session.     Exercises     Assessment/Plan    PT Assessment Patient needs continued PT services  PT Problem List Decreased strength;Decreased mobility;Decreased safety awareness;Decreased balance;Pain;Decreased knowledge of use of DME;Decreased activity tolerance          PT Treatment Interventions DME instruction;Therapeutic activities;Gait training;Therapeutic exercise;Balance training;Functional mobility training;Stair training;Patient/family education    PT Goals (Current goals can be found in the Care Plan section)  Acute Rehab PT Goals Patient Stated Goal: to get something to eat PT Goal Formulation: With patient Time For Goal Achievement: 03/14/16 Potential to Achieve Goals: Good    Frequency Min 3X/week   Barriers to discharge Decreased caregiver support alone during the day    Co-evaluation               End of Session Equipment Utilized During Treatment: Gait belt Activity Tolerance: Patient tolerated treatment well Patient left: in chair;with call bell/phone within reach;with chair alarm set;with family/visitor present Nurse Communication: Mobility status         Time: 1200-1221 PT Time Calculation (min) (ACUTE ONLY): 21 min   Charges:   PT Evaluation $PT Eval Moderate Complexity: 1 Procedure     PT G Codes:        [name] 02/29/2016, 1:14 PM Wray Kearns, [place], DPT [phone]

## 2016-02-29 NOTE — Progress Notes (Signed)
PROGRESS NOTE    Reginald Ochoa  [id number] DOB: 12/23/1940 DOA: 02/28/2016 PCP: Elyn Peers, MD  Brief Narrative:Reginald Ochoa is a 75 y.o. male with medical history significant of hypertension, asthma, depression, anxiety, fall due to orthostatic status, GI bleeding, tobacco abuse, former alcoholism, who presented with diarrhea and syncope.  He was found to have positive orthostatic vital signs in ED.  CT abdomen/pelvis showed possible pancreatic cancer with possible liver metastasis  Assessment & Plan: Syncope and fall:  -likely due to orthostatic hypotension secondary to dehydration from diarrhea - CT head is negative for acute intracranial abnormalities. -continue IVf today -will need PT eval  Pancreatic mass: Very concerning for pancreatic cancer with liver metastasis -FU  MRI of abdomen, [facility] consulted, may need EUS/Biopsy -FU CEA and CA-19-9  Diarrhea:  -suspect non infectious -Cdiff PCR negative, supportive care  HTN: used to be on spironolactone, currently not taking medications. - Hydralazine when necessary  Asthma: stable.  -prn albuterol nebulizers  Tobacco abuse: -Did counseling about importance of quitting smoking -Nicotine patch  Depression and anxiety:  -Continue home medications: Xanax, Prozac, mirtazapine  AKI: Likely due to prerenal secondary to dehydration. - resolved with IVF  DVT prophylaxis:  Code Status:Full Code Family Communication:Stepson at bedside Disposition Plan:Home pending workup  Consultants:   [facility]   Subjective: Feels ok, some ongoing diarrhea in past week, denies ETOH use now  Objective: Vitals:   02/29/16 0045 02/29/16 0100 02/29/16 0115 02/29/16 0319  BP:    (!) 151/98  Pulse:  107 103 (!) 104  Resp:    18  Temp:    99.1 F (37.3 C)  TempSrc:    Oral  SpO2: 100% 100% 100% 100%  Weight:      Height:        Intake/Output Summary (Last 24 hours) at 02/29/16 1121 Last data filed at  02/29/16 1032  Gross per 24 hour  Intake          3321.75 ml  Output             1002 ml  Net          2319.75 ml   Filed Weights   02/28/16 1652  Weight: 72.6 kg (160 lb)    Examination:  General exam: Appears calm and comfortable, frail, chronically ill appearing Respiratory system: Clear to auscultation. Respiratory effort normal. Cardiovascular system: S1 & S2 heard, RRR. No JVD, murmurs, rubs, gallops or clicks. No pedal edema. Gastrointestinal system: Abdomen is nondistended, soft and nontender. No organomegaly or masses felt. Normal bowel sounds heard. Central nervous system: Alert and oriented. No focal neurological deficits. Extremities: Symmetric 5 x 5 power. Skin: No rashes, lesions or ulcers Psychiatry: Judgement and insight appear normal. Mood & affect appropriate.     Data Reviewed: I have personally reviewed following labs and imaging studies  CBC:  Recent Labs Lab 02/28/16 1657 02/29/16 0454  WBC 9.8 8.0  NEUTROABS 7.3  --   HGB 11.5* 12.0*  HCT 33.8* 35.3*  MCV 87.6 88.0  PLT 171 0000000   Basic Metabolic Panel:  Recent Labs Lab 02/28/16 1657 02/29/16 0454  NA 136 139  K 3.8 3.8  CL 103 106  CO2 25 27  GLUCOSE 108* 114*  BUN 15 10  CREATININE 1.34* 1.12  CALCIUM 8.6* 8.4*   GFR: Estimated Creatinine Clearance: 54.1 mL/min (by C-G formula based on SCr of 1.12 mg/dL). Liver Function Tests:  Recent Labs Lab 02/28/16 1657 02/29/16 0454  AST 21 23  ALT 16* 17  ALKPHOS 69 67  BILITOT 0.9 0.8  PROT 5.7* 5.6*  ALBUMIN 3.2* 3.2*    Recent Labs Lab 02/28/16 1657  LIPASE 25   No results for input(s): AMMONIA in the last 168 hours. Coagulation Profile:  Recent Labs Lab 02/29/16 0454  INR 1.12   Cardiac Enzymes: No results for input(s): CKTOTAL, CKMB, CKMBINDEX, TROPONINI in the last 168 hours. BNP (last 3 results) No results for input(s): PROBNP in the last 8760 hours. HbA1C: No results for input(s): HGBA1C in the last 72  hours. CBG:  Recent Labs Lab 02/29/16 0316 02/29/16 0845  GLUCAP 132* 99   Lipid Profile: No results for input(s): CHOL, HDL, LDLCALC, TRIG, CHOLHDL, LDLDIRECT in the last 72 hours. Thyroid Function Tests: No results for input(s): TSH, T4TOTAL, FREET4, T3FREE, THYROIDAB in the last 72 hours. Anemia Panel: No results for input(s): VITAMINB12, FOLATE, FERRITIN, TIBC, IRON, RETICCTPCT in the last 72 hours. Urine analysis:    Component Value Date/Time   COLORURINE YELLOW 02/28/2016 [place] 02/28/2016 1830   LABSPEC 1.013 02/28/2016 1830   PHURINE 6.0 02/28/2016 1830   GLUCOSEU NEGATIVE 02/28/2016 1830   HGBUR NEGATIVE 02/28/2016 [place] 02/28/2016 1830   KETONESUR 15 (A) 02/28/2016 1830   PROTEINUR NEGATIVE 02/28/2016 1830   UROBILINOGEN 0.2 02/11/2009 1534   NITRITE NEGATIVE 02/28/2016 1830   LEUKOCYTESUR NEGATIVE 02/28/2016 1830   Sepsis Labs: @LABRCNTIP (procalcitonin:4,lacticidven:4)  ) Recent Results (from the past 240 hour(s))  C difficile quick scan w PCR reflex     Status: None   Collection Time: 02/29/16  4:03 AM  Result Value Ref Range Status   C Diff antigen NEGATIVE NEGATIVE Final   C Diff toxin NEGATIVE NEGATIVE Final   C Diff interpretation No C. difficile detected.  Final         Radiology Studies: Dg Chest 2 View  Result Date: 02/29/2016 CLINICAL DATA:  Initial valuation for acute cough. EXAM: CHEST  2 VIEW COMPARISON:  None. FINDINGS: Cardiac and mediastinal silhouettes are within normal limits. Lungs normally inflated. No focal trait, pulmonary edema, or pleural effusion. No pneumothorax. No acute osseous abnormality. IMPRESSION: No active cardiopulmonary disease. Electronically Signed   By: Jeannine Boga M.D.   On: 02/29/2016 03:07   Ct Head Wo Contrast  Result Date: 02/28/2016 CLINICAL DATA:  Hit top of head now with small hematoma. EXAM: CT HEAD WITHOUT CONTRAST TECHNIQUE: Contiguous axial images  were obtained from the base of the skull through the vertex without intravenous contrast. COMPARISON:  05/09/2014 FINDINGS: Brain: Similar findings of advanced atrophy with sulcal prominence centralized volume loss with commensurate ex vacuo dilatation of the ventricular system. Stable sequela of prior right PCA distribution infarct with associated asymmetric ex vacuo dilatation involving the occipital horn of the right lateral ventricle. Scattered minimal periventricular hypodensities compatible microvascular ischemic disease. Given extensive background parenchymal abnormalities, there is no CT evidence superimposed acute large territory infarct. Punctate (approximately 3 mm) suspected meningioma about the inner table of the right frontal cortex (coronal image 26, series 205). Otherwise, no intraparenchymal or extra-axial mass. No intraparenchymal or extra-axial hemorrhage. Unchanged size and configuration of the ventricles and the basilar cisterns. No midline shift. Vascular: Intracranial atherosclerosis. Skull: No displaced calvarial fracture. Sinuses/Orbits: There is underpneumatization the bilateral frontal sinuses. A tooth is again noted to be impacted within the caudal aspect of the left maxillary sinus. The remaining paranasal sinuses and mastoid air cells are normally aerated.  No air-fluid levels. Post left-sided cataract surgery. Other: Unchanged dermal calcification is noted about the right parietal calvarium (image 19, series 205). Regional soft tissues appear normal. No radiopaque foreign body. 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 Electronically Signed   By: Sandi Mariscal M.D.   On: 02/28/2016 17:51   Ct Abdomen Pelvis W Contrast  Result Date: 02/29/2016 CLINICAL DATA:  75 year old male with diarrhea and diffuse abdominal pain. EXAM: CT ABDOMEN AND PELVIS WITH CONTRAST TECHNIQUE:  Multidetector CT imaging of the abdomen and pelvis was performed using the standard protocol following bolus administration of intravenous contrast. CONTRAST:  41mL ISOVUE-300 IOPAMIDOL (ISOVUE-300) INJECTION 61% COMPARISON:  None. FINDINGS: Lower chest: The visualized lung bases are clear. No intra-abdominal free air.  Trace perihepatic free fluid. Hepatobiliary: There are multiple hepatic hypodense lesions which demonstrate somewhat ill-defined margins. The largest lesion is in the dome of the liver and measures 2.0 x 1.5 cm. These lesions are not well characterized but are concerning for metastatic disease. Further evaluation with MRI without and with contrast is recommended. Mild periportal edema. No biliary ductal dilatation. The gallbladder is mildly distended. No calcified gallstone identified. Pancreas: There is a 3.5 x 2.8 x 2.8 cm hypoenhancing mass in the head and uncinate process of the pancreas. There is associated gland atrophy with mild dilatation of the main pancreatic duct. Spleen: Choose 1 Adrenals/Urinary Tract: The adrenal glands appear unremarkable. Mild bilateral renal atrophy. Subcentimeter right renal upper pole hypodense lesion is not well characterized but most likely represents a cyst. There is no hydronephrosis on either side. The visualized ureters appear unremarkable. There is thickened and trabecular appearance of the urinary bladder likely related to chronic bladder outlet obstruction. Correlation with urinalysis recommended to exclude UTI. Stomach/Bowel: There is sigmoid diverticulosis with muscular hypertrophy. Underlying colonic mass is not excluded. Follow-up with colonoscopy recommended. Mild haziness of the sigmoid colon may be chronic or represent mild diverticulitis. Scattered colonic diverticula noted. There is no evidence of bowel obstruction. Normal appendix. Vascular/Lymphatic: There is moderate aortoiliac atherosclerotic disease. The abdominal aorta and IVC are otherwise  unremarkable. The origins of the celiac axis, SMA, IMA as well as the origins of the renal arteries appear patent. There is no infiltration of the fat surrounding the celiac axis or SMA. The SMV, splenic vein, and main portal vein are patent. No portal venous gas identified. A nodular density measuring 7 mm in short anterior to the pancreatic mass may represent a slightly enlarged lymph node or a satellite lesion. No other adenopathy identified. A smaller nodular density noted posterior to the pancreatic mass and anterior to the IVC (series 2, image 24). Reproductive: Heterogeneous prostate gland. Other: There is loss of subcutaneous fat predominantly involving the lower chest and cachexia. Musculoskeletal: Degenerative changes of the spine. Disc desiccation with vacuum phenomenon in the lower lumbar spine. No acute fracture. 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 Further evaluation with MRI without and with contrast and MRCP recommended. Small nodular density anterior to the pancreatic mass is concerning for satellite lesions. Multiple hepatic hypodense lesions most compatible with metastatic disease. No evidence of celiac or SMA encasement. Sigmoid diverticulosis with muscular hypertrophy. Mild sigmoid diverticulitis is not excluded. No bowel obstruction. Normal appendix. Follow-up with nonemergent colonoscopy recommended to exclude underlying sigmoid mass. Electronically Signed   By: Laren Everts.D.  On: 02/29/2016 00:20        Scheduled Meds: . amphetamine-dextroamphetamine  15 mg Oral Q breakfast  . atorvastatin  20 mg Oral Daily  . FLUoxetine  40 mg Oral Q breakfast  . mirtazapine  45 mg Oral QHS  . multivitamin with minerals  1 tablet Oral Daily  . nicotine  21 mg Transdermal Daily  . sodium chloride flush  3 mL Intravenous Q12H   Continuous Infusions: .  sodium chloride 75 mL/hr at 02/29/16 0331     LOS: 0 days    Time spent: 10min    Domenic Polite, MD [facility] Pager [phone]  If 7PM-7AM, please contact night-coverage [email or web address] Password [email or web address] 02/29/2016, 11:21 AM

## 2016-02-29 NOTE — Care Management Note (Signed)
Case Management Note  Patient Details  Name: Reginald Ochoa MRN: [id number] Date of Birth: 1941/01/27  Subjective/Objective:                 Spoke with patient and wife at the bedside. Patient has cane and walker at home, patient and wife declined [facility] services. Patient is alone for a portion of the day. Patient's wife states that she is coordinating with neighbors to have someone to check on him likely around lunch time each day to come to the house and get his lunch out. Admitted with orthostasis, dehydration and diarrhea. Visualized pancreatic mass, will biopsy prior to DC.    Action/Plan:  No CM needs identified at this time.  Expected Discharge Date:                  Expected Discharge Plan:  Home/Self Care  In-House Referral:  NA  Discharge planning Services  CM Consult  Post Acute Care Choice:  NA Choice offered to:  NA  DME Arranged:  N/A DME Agency:  NA  HH Arranged:  NA HH Agency:  NA  Status of Service:  In process, will continue to follow  If discussed at Long Length of Stay Meetings, dates discussed:    Additional Comments:  Carles Collet, RN 02/29/2016, 3:17 PM

## 2016-02-29 NOTE — Consult Note (Signed)
Chief Complaint: pancreatic mass with liver lesions  Referring Physician:Dr. Domenic Polite  Supervising Physician: Sandi Mariscal  Patient Status: [facility] - In-pt  HPI: Reginald Ochoa is an 75 y.o. male who was admitted to yesterday after a fall on the sidewalk.  He has been having diarrhea for the last 2 weeks as well, 3-4 times a day.  He was felt to have fallen secondary to dehydration.  Upon admission, he had a CT scan of his abdomen.  This revealed a hypoenhancing mass in the head and uncinate process of the pancreas which appears to be possibly malignant.  He also have multiple hepatic hypodense lesions that may be metastatic.  We have been asked to see the patient for consideration of biopsy.  Past Medical History:  Past Medical History:  Diagnosis Date  . Alcohol abuse   . Asthma   . Depression with anxiety   . GIB (gastrointestinal bleeding)   . Hypertension   . Tobacco abuse     Past Surgical History:  Past Surgical History:  Procedure Laterality Date  . bleeding ulcer      Family History:  Family History  Problem Relation Age of Onset  . Stroke Mother     Social History:  reports that he has been smoking Cigarettes.  He has a 20.00 pack-year smoking history. He has never used smokeless tobacco. His alcohol and drug histories are not on file.  Allergies: No Known Allergies  Medications: Medications reviewed in Epic  Please HPI for pertinent positives, otherwise complete 10 system ROS negative.  Mallampati Score: MD Evaluation Airway: WNL Heart: WNL Abdomen: WNL Chest/ Lungs: WNL ASA  Classification: 2 Mallampati/Airway Score: Two  Physical Exam: BP (!) 151/98 (BP Location: Right Arm)   Pulse (!) 104   Temp 99.1 F (37.3 C) (Oral)   Resp 18   Ht '5\' 7"'$  (1.702 m)   Wt 160 lb (72.6 kg)   SpO2 100%   BMI 25.06 kg/m  Body mass index is 25.06 kg/m. General: pleasant, but disheveled appearing white male who is laying in bed in NAD HEENT: head is  normocephalic, atraumatic.  Sclera are noninjected.  PERRL.  Ears and nose without any masses or lesions.  Mouth is pink and moist Heart: regular rhythm, slightly tachycardic.  Normal s1,s2. No obvious murmurs, gallops, or rubs noted.  Palpable radial and pedal pulses bilaterally Lungs: CTAB, no wheezes, rhonchi, or rales noted.  Respiratory effort nonlabored Abd: soft, NT, ND, +BS, no masses, hernias, or organomegaly Psych: A&Ox3 with an appropriate affect.   Labs: Results for orders placed or performed during the hospital encounter of 02/28/16 (from the past 48 hour(s))  CBC with Differential     Status: Abnormal   Collection Time: 02/28/16  4:57 PM  Result Value Ref Range   WBC 9.8 4.0 - 10.5 K/uL   RBC 3.86 (L) 4.22 - 5.81 MIL/uL   Hemoglobin 11.5 (L) 13.0 - 17.0 g/dL   HCT 33.8 (L) 39.0 - 52.0 %   MCV 87.6 78.0 - 100.0 fL   MCH 29.8 26.0 - 34.0 pg   MCHC 34.0 30.0 - 36.0 g/dL   RDW 12.9 11.5 - 15.5 %   Platelets 171 150 - 400 K/uL   Neutrophils Relative % 74 %   Neutro Abs 7.3 1.7 - 7.7 K/uL   Lymphocytes Relative 15 %   Lymphs Abs 1.4 0.7 - 4.0 K/uL   Monocytes Relative 10 %   Monocytes Absolute 0.9 0.1 -  1.0 K/uL   Eosinophils Relative 1 %   Eosinophils Absolute 0.1 0.0 - 0.7 K/uL   Basophils Relative 0 %   Basophils Absolute 0.0 0.0 - 0.1 K/uL  Comprehensive metabolic panel     Status: Abnormal   Collection Time: 02/28/16  4:57 PM  Result Value Ref Range   Sodium 136 135 - 145 mmol/L   Potassium 3.8 3.5 - 5.1 mmol/L   Chloride 103 101 - 111 mmol/L   CO2 25 22 - 32 mmol/L   Glucose, Bld 108 (H) 65 - 99 mg/dL   BUN 15 6 - 20 mg/dL   Creatinine, Ser 1.34 (H) 0.61 - 1.24 mg/dL   Calcium 8.6 (L) 8.9 - 10.3 mg/dL   Total Protein 5.7 (L) 6.5 - 8.1 g/dL   Albumin 3.2 (L) 3.5 - 5.0 g/dL   AST 21 15 - 41 U/L   ALT 16 (L) 17 - 63 U/L   Alkaline Phosphatase 69 38 - 126 U/L   Total Bilirubin 0.9 0.3 - 1.2 mg/dL   GFR calc non Af Amer 51 (L) >60 mL/min   GFR calc Af Amer 59  (L) >60 mL/min    Comment: (NOTE) The eGFR has been calculated using the CKD EPI equation. This calculation has not been validated in all clinical situations. eGFR's persistently <60 mL/min signify possible Chronic Kidney Disease.    Anion gap 8 5 - 15  Lipase, blood     Status: None   Collection Time: 02/28/16  4:57 PM  Result Value Ref Range   Lipase 25 11 - 51 U/L  I-Stat CG4 Lactic Acid, ED     Status: None   Collection Time: 02/28/16  5:05 PM  Result Value Ref Range   Lactic Acid, Venous 1.76 0.5 - 1.9 mmol/L  Urinalysis, Routine w reflex microscopic (not at [facility])     Status: Abnormal   Collection Time: 02/28/16  6:30 PM  Result Value Ref Range   Color, Urine YELLOW YELLOW   APPearance CLEAR CLEAR   Specific Gravity, Urine 1.013 1.005 - 1.030   pH 6.0 5.0 - 8.0   Glucose, UA NEGATIVE NEGATIVE mg/dL   Hgb urine dipstick NEGATIVE NEGATIVE   Bilirubin Urine NEGATIVE NEGATIVE   Ketones, ur 15 (A) NEGATIVE mg/dL   Protein, ur NEGATIVE NEGATIVE mg/dL   Nitrite NEGATIVE NEGATIVE   Leukocytes, UA NEGATIVE NEGATIVE    Comment: MICROSCOPIC NOT DONE ON URINES WITH NEGATIVE PROTEIN, BLOOD, LEUKOCYTES, NITRITE, OR GLUCOSE <1000 mg/dL.  Creatinine, urine, random     Status: None   Collection Time: 02/28/16  6:30 PM  Result Value Ref Range   Creatinine, Urine 92.66 mg/dL  I-Stat CG4 Lactic Acid, ED     Status: None   Collection Time: 02/28/16  7:42 PM  Result Value Ref Range   Lactic Acid, Venous 1.23 0.5 - 1.9 mmol/L  Glucose, capillary     Status: Abnormal   Collection Time: 02/29/16  3:16 AM  Result Value Ref Range   Glucose-Capillary 132 (H) 65 - 99 mg/dL   Comment 1 Notify RN    Comment 2 Document in Chart   C difficile quick scan w PCR reflex     Status: None   Collection Time: 02/29/16  4:03 AM  Result Value Ref Range   C Diff antigen NEGATIVE NEGATIVE   C Diff toxin NEGATIVE NEGATIVE   C Diff interpretation No C. difficile detected.   Protime-INR     Status: None  Collection Time: 02/29/16  4:54 AM  Result Value Ref Range   Prothrombin Time 14.4 11.4 - 15.2 seconds   INR 1.12   APTT     Status: Abnormal   Collection Time: 02/29/16  4:54 AM  Result Value Ref Range   aPTT 44 (H) 24 - 36 seconds    Comment:        IF BASELINE aPTT IS ELEVATED, SUGGEST PATIENT RISK ASSESSMENT BE USED TO DETERMINE APPROPRIATE ANTICOAGULANT THERAPY.   Comprehensive metabolic panel     Status: Abnormal   Collection Time: 02/29/16  4:54 AM  Result Value Ref Range   Sodium 139 135 - 145 mmol/L   Potassium 3.8 3.5 - 5.1 mmol/L   Chloride 106 101 - 111 mmol/L   CO2 27 22 - 32 mmol/L   Glucose, Bld 114 (H) 65 - 99 mg/dL   BUN 10 6 - 20 mg/dL   Creatinine, Ser 1.12 0.61 - 1.24 mg/dL   Calcium 8.4 (L) 8.9 - 10.3 mg/dL   Total Protein 5.6 (L) 6.5 - 8.1 g/dL   Albumin 3.2 (L) 3.5 - 5.0 g/dL   AST 23 15 - 41 U/L   ALT 17 17 - 63 U/L   Alkaline Phosphatase 67 38 - 126 U/L   Total Bilirubin 0.8 0.3 - 1.2 mg/dL   GFR calc non Af Amer >60 >60 mL/min   GFR calc Af Amer >60 >60 mL/min    Comment: (NOTE) The eGFR has been calculated using the CKD EPI equation. This calculation has not been validated in all clinical situations. eGFR's persistently <60 mL/min signify possible Chronic Kidney Disease.    Anion gap 6 5 - 15  CBC     Status: Abnormal   Collection Time: 02/29/16  4:54 AM  Result Value Ref Range   WBC 8.0 4.0 - 10.5 K/uL   RBC 4.01 (L) 4.22 - 5.81 MIL/uL   Hemoglobin 12.0 (L) 13.0 - 17.0 g/dL   HCT 35.3 (L) 39.0 - 52.0 %   MCV 88.0 78.0 - 100.0 fL   MCH 29.9 26.0 - 34.0 pg   MCHC 34.0 30.0 - 36.0 g/dL   RDW 13.1 11.5 - 15.5 %   Platelets 179 150 - 400 K/uL  Glucose, capillary     Status: None   Collection Time: 02/29/16  8:45 AM  Result Value Ref Range   Glucose-Capillary 99 65 - 99 mg/dL    Imaging: Dg Chest 2 View  Result Date: 02/29/2016 CLINICAL DATA:  Initial valuation for acute cough. EXAM: CHEST  2 VIEW COMPARISON:  None. FINDINGS: Cardiac  and mediastinal silhouettes are within normal limits. Lungs normally inflated. No focal trait, pulmonary edema, or pleural effusion. No pneumothorax. No acute osseous abnormality. IMPRESSION: No active cardiopulmonary disease. Electronically Signed   By: Jeannine Boga M.D.   On: 02/29/2016 03:07   Ct Head Wo Contrast  Result Date: 02/28/2016 CLINICAL DATA:  Hit top of head now with small hematoma. EXAM: CT HEAD WITHOUT CONTRAST TECHNIQUE: Contiguous axial images were obtained from the base of the skull through the vertex without intravenous contrast. COMPARISON:  05/09/2014 FINDINGS: Brain: Similar findings of advanced atrophy with sulcal prominence centralized volume loss with commensurate ex vacuo dilatation of the ventricular system. Stable sequela of prior right PCA distribution infarct with associated asymmetric ex vacuo dilatation involving the occipital horn of the right lateral ventricle. Scattered minimal periventricular hypodensities compatible microvascular ischemic disease. Given extensive background parenchymal abnormalities, there is no CT evidence superimposed  acute large territory infarct. Punctate (approximately 3 mm) suspected meningioma about the inner table of the right frontal cortex (coronal image 26, series 205). Otherwise, no intraparenchymal or extra-axial mass. No intraparenchymal or extra-axial hemorrhage. Unchanged size and configuration of the ventricles and the basilar cisterns. No midline shift. Vascular: Intracranial atherosclerosis. Skull: No displaced calvarial fracture. Sinuses/Orbits: There is underpneumatization the bilateral frontal sinuses. A tooth is again noted to be impacted within the caudal aspect of the left maxillary sinus. The remaining paranasal sinuses and mastoid air cells are normally aerated. No air-fluid levels. Post left-sided cataract surgery. Other: Unchanged dermal calcification is noted about the right parietal calvarium (image 19, series 205).  Regional soft tissues appear normal. No radiopaque foreign body. 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 Electronically Signed   By: Sandi Mariscal M.D.   On: 02/28/2016 17:51   Ct Abdomen Pelvis W Contrast  Result Date: 02/29/2016 CLINICAL DATA:  75 year old male with diarrhea and diffuse abdominal pain. EXAM: CT ABDOMEN AND PELVIS WITH CONTRAST TECHNIQUE: Multidetector CT imaging of the abdomen and pelvis was performed using the standard protocol following bolus administration of intravenous contrast. CONTRAST:  53m ISOVUE-300 IOPAMIDOL (ISOVUE-300) INJECTION 61% COMPARISON:  None. FINDINGS: Lower chest: The visualized lung bases are clear. No intra-abdominal free air.  Trace perihepatic free fluid. Hepatobiliary: There are multiple hepatic hypodense lesions which demonstrate somewhat ill-defined margins. The largest lesion is in the dome of the liver and measures 2.0 x 1.5 cm. These lesions are not well characterized but are concerning for metastatic disease. Further evaluation with MRI without and with contrast is recommended. Mild periportal edema. No biliary ductal dilatation. The gallbladder is mildly distended. No calcified gallstone identified. Pancreas: There is a 3.5 x 2.8 x 2.8 cm hypoenhancing mass in the head and uncinate process of the pancreas. There is associated gland atrophy with mild dilatation of the main pancreatic duct. Spleen: Choose 1 Adrenals/Urinary Tract: The adrenal glands appear unremarkable. Mild bilateral renal atrophy. Subcentimeter right renal upper pole hypodense lesion is not well characterized but most likely represents a cyst. There is no hydronephrosis on either side. The visualized ureters appear unremarkable. There is thickened and trabecular appearance of the urinary bladder likely related to chronic bladder outlet obstruction. Correlation with urinalysis  recommended to exclude UTI. Stomach/Bowel: There is sigmoid diverticulosis with muscular hypertrophy. Underlying colonic mass is not excluded. Follow-up with colonoscopy recommended. Mild haziness of the sigmoid colon may be chronic or represent mild diverticulitis. Scattered colonic diverticula noted. There is no evidence of bowel obstruction. Normal appendix. Vascular/Lymphatic: There is moderate aortoiliac atherosclerotic disease. The abdominal aorta and IVC are otherwise unremarkable. The origins of the celiac axis, SMA, IMA as well as the origins of the renal arteries appear patent. There is no infiltration of the fat surrounding the celiac axis or SMA. The SMV, splenic vein, and main portal vein are patent. No portal venous gas identified. A nodular density measuring 7 mm in short anterior to the pancreatic mass may represent a slightly enlarged lymph node or a satellite lesion. No other adenopathy identified. A smaller nodular density noted posterior to the pancreatic mass and anterior to the IVC (series 2, image 24). Reproductive: Heterogeneous prostate gland. Other: There is loss of subcutaneous fat predominantly involving the lower chest and cachexia. Musculoskeletal: Degenerative changes of the spine. Disc desiccation with vacuum phenomenon in the lower lumbar spine. No acute fracture. 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 Further evaluation with MRI without and with contrast and MRCP recommended. Small nodular density anterior to the pancreatic mass is concerning for satellite lesions. Multiple hepatic hypodense lesions most compatible with metastatic disease. No evidence of celiac or SMA encasement. Sigmoid diverticulosis with muscular hypertrophy. Mild sigmoid diverticulitis is not excluded. No bowel obstruction. Normal appendix. Follow-up with nonemergent colonoscopy  recommended to exclude underlying sigmoid mass. Electronically Signed   By: Anner Crete M.D.   On: 02/29/2016 00:20    Assessment/Plan 1. Pancreatic mass with liver lesions -Dr. Pascal Lux has reviewed his imaging.  These lesions are amendable to biopsy.  GI was consulted and actually requested we biopsy the liver lesions.  We would like to get a CT of the chest for complete staging, but to also make sure there is nothing else that would be better to biopsy. -in the interim we will get him ready for biopsy tomorrow, pending no other changes. -NPO p MN -await results of CT of chest -no blood thinners tomorrow   Thank you for this interesting consult.  I greatly enjoyed meeting LANDERS PRAJAPATI and look forward to participating in their care.  A copy of this report was sent to the requesting provider on this date.  Electronically Signed: Henreitta Cea 02/29/2016, 3:36 PM   I spent a total of 40 Minutes    in face to face in clinical consultation, greater than 50% of which was counseling/coordinating care for liver lesions

## 2016-02-29 NOTE — ED Notes (Signed)
Gave pt a sandwich and ginger ale.

## 2016-02-29 NOTE — Consult Note (Signed)
Referring Provider: Dr. Broadus John Primary Care Physician:  Elyn Peers, MD Primary Gastroenterologist:  Dr. Cristina Gong  Reason for Consultation:  Pancreatic Mass  HPI: Reginald Ochoa is a 75 y.o. male with multiple medical problems seen in ER for syncope and diarrhea and during work up a CT scan was done that showed a pancreatic mass with liver mets. Patient has a history of alcohol abuse. LFTs and lipase within normal limits. He denies abdominal pain. No history of jaundice. His wife gives most of his history. CA19-9 pending. C. Diff negative. Wife and son at bedside.   Past Medical History:  Diagnosis Date  . Alcohol abuse   . Asthma   . Depression with anxiety   . GIB (gastrointestinal bleeding)   . Hypertension   . Tobacco abuse     Past Surgical History:  Procedure Laterality Date  . bleeding ulcer      Prior to Admission medications   Medication Sig Start Date End Date Taking? Authorizing Provider  albuterol (PROVENTIL HFA;VENTOLIN HFA) 108 (90 BASE) MCG/ACT inhaler Inhale 2 puffs into the lungs every 6 (six) hours as needed for wheezing or shortness of breath.   Yes Historical Provider, MD  ALPRAZolam Duanne Moron) 1 MG tablet Take 0.5-1 mg by mouth 2 (two) times daily as needed for anxiety. Anxiety    Yes Historical Provider, MD  amphetamine-dextroamphetamine (ADDERALL) 10 MG tablet Take 15 mg by mouth every morning.    Yes Historical Provider, MD  cetirizine (ZYRTEC) 10 MG tablet Take 10 mg by mouth at bedtime.   Yes Historical Provider, MD  FLUoxetine (PROZAC) 40 MG capsule Take 40 mg by mouth daily with breakfast.     Yes Historical Provider, MD  HYDROcodone-acetaminophen (NORCO) 10-325 MG tablet Take 1 tablet by mouth daily as needed for moderate pain.  02/06/16  Yes Historical Provider, MD  LUTEIN PO Take 1 tablet by mouth daily.   Yes Historical Provider, MD  Multiple Vitamins-Minerals (MULTIVITAMIN WITH MINERALS) tablet Take 1 tablet by mouth daily.    Yes Historical Provider,  MD  OVER THE COUNTER MEDICATION Place 2 sprays into both nostrils as needed (for congestion). [facility] Nasal Spray for congestion   Yes Historical Provider, MD  valsartan-hydrochlorothiazide (DIOVAN-HCT) 80-12.5 MG tablet Take 1 tablet by mouth daily. 02/18/16  Yes Historical Provider, MD  doxycycline (VIBRAMYCIN) 100 MG capsule Take 1 capsule (100 mg total) by mouth 2 (two) times daily. Patient not taking: Reported on 02/29/2016 03/05/12   Shanon Rosser, MD  HYDROcodone-homatropine [facility]) 5-1.5 MG/5ML syrup Take 5 mLs by mouth every 4 (four) hours as needed for cough or pain. Patient not taking: Reported on 02/29/2016 03/05/12   Shanon Rosser, MD    Scheduled Meds: . amphetamine-dextroamphetamine  15 mg Oral Q breakfast  . atorvastatin  20 mg Oral Daily  . FLUoxetine  40 mg Oral Q breakfast  . mirtazapine  45 mg Oral QHS  . multivitamin with minerals  1 tablet Oral Daily  . nicotine  21 mg Transdermal Daily  . sodium chloride flush  3 mL Intravenous Q12H   Continuous Infusions: . sodium chloride 75 mL/hr at 02/29/16 0331   PRN Meds:.albuterol, ALPRAZolam, hydrALAZINE, HYDROcodone-acetaminophen, HYDROcodone-homatropine, ondansetron, temazepam  Allergies as of 02/28/2016  . (No Known Allergies)    Family History  Problem Relation Age of Onset  . Stroke Mother     Social History   Social History  . Marital status: Married    Spouse name: N/A  . Number of children: N/A  .  Years of education: N/A   Occupational History  . Not on file.   Social History Main Topics  . Smoking status: Current Every Day Smoker    Packs/day: 0.50    Years: 40.00    Types: Cigarettes  . Smokeless tobacco: Never Used  . Alcohol use Not on file  . Drug use: Unknown  . Sexual activity: Not on file   Other Topics Concern  . Not on file   Social History Narrative  . No narrative on file    Review of Systems: All negative except as stated above in HPI.  Physical Exam: Vital signs: Vitals:    02/29/16 0115 02/29/16 0319  BP:  (!) 151/98  Pulse: 103 (!) 104  Resp:  18  Temp:  99.1 F (37.3 C)   Last BM Date: 02/29/16 General:   Elderly, thin, disheveled, no acute distress HEENT: anicteric sclera Lungs:  Clear throughout to auscultation.   No wheezes, crackles, or rhonchi. No acute distress. Heart:  Regular rate and rhythm; no murmurs, clicks, rubs,  or gallops. Abdomen: soft, nontender, nondistended, +BS  Rectal:  Deferred Ext: no edema  GI:  Lab Results:  Recent Labs  02/28/16 1657 02/29/16 0454  WBC 9.8 8.0  HGB 11.5* 12.0*  HCT 33.8* 35.3*  PLT 171 179   BMET  Recent Labs  02/28/16 1657 02/29/16 0454  NA 136 139  K 3.8 3.8  CL 103 106  CO2 25 27  GLUCOSE 108* 114*  BUN 15 10  CREATININE 1.34* 1.12  CALCIUM 8.6* 8.4*   LFT  Recent Labs  02/29/16 0454  PROT 5.6*  ALBUMIN 3.2*  AST 23  ALT 17  ALKPHOS 67  BILITOT 0.8   PT/INR  Recent Labs  02/29/16 0454  LABPROT 14.4  INR 1.12     Studies/Results: Dg Chest 2 View  Result Date: 02/29/2016 CLINICAL DATA:  Initial valuation for acute cough. EXAM: CHEST  2 VIEW COMPARISON:  None. FINDINGS: Cardiac and mediastinal silhouettes are within normal limits. Lungs normally inflated. No focal trait, pulmonary edema, or pleural effusion. No pneumothorax. No acute osseous abnormality. IMPRESSION: No active cardiopulmonary disease. Electronically Signed   By: Jeannine Boga M.D.   On: 02/29/2016 03:07   Ct Head Wo Contrast  Result Date: 02/28/2016 CLINICAL DATA:  Hit top of head now with small hematoma. EXAM: CT HEAD WITHOUT CONTRAST TECHNIQUE: Contiguous axial images were obtained from the base of the skull through the vertex without intravenous contrast. COMPARISON:  05/09/2014 FINDINGS: Brain: Similar findings of advanced atrophy with sulcal prominence centralized volume loss with commensurate ex vacuo dilatation of the ventricular system. Stable sequela of prior right PCA distribution  infarct with associated asymmetric ex vacuo dilatation involving the occipital horn of the right lateral ventricle. Scattered minimal periventricular hypodensities compatible microvascular ischemic disease. Given extensive background parenchymal abnormalities, there is no CT evidence superimposed acute large territory infarct. Punctate (approximately 3 mm) suspected meningioma about the inner table of the right frontal cortex (coronal image 26, series 205). Otherwise, no intraparenchymal or extra-axial mass. No intraparenchymal or extra-axial hemorrhage. Unchanged size and configuration of the ventricles and the basilar cisterns. No midline shift. Vascular: Intracranial atherosclerosis. Skull: No displaced calvarial fracture. Sinuses/Orbits: There is underpneumatization the bilateral frontal sinuses. A tooth is again noted to be impacted within the caudal aspect of the left maxillary sinus. The remaining paranasal sinuses and mastoid air cells are normally aerated. No air-fluid levels. Post left-sided cataract surgery. Other: Unchanged dermal calcification is  noted about the right parietal calvarium (image 19, series 205). Regional soft tissues appear normal. No radiopaque foreign body. 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 Electronically Signed   By: Sandi Mariscal M.D.   On: 02/28/2016 17:51   Ct Abdomen Pelvis W Contrast  Result Date: 02/29/2016 CLINICAL DATA:  75 year old male with diarrhea and diffuse abdominal pain. EXAM: CT ABDOMEN AND PELVIS WITH CONTRAST TECHNIQUE: Multidetector CT imaging of the abdomen and pelvis was performed using the standard protocol following bolus administration of intravenous contrast. CONTRAST:  36mL ISOVUE-300 IOPAMIDOL (ISOVUE-300) INJECTION 61% COMPARISON:  None. FINDINGS: Lower chest: The visualized lung bases are clear. No intra-abdominal free air.  Trace  perihepatic free fluid. Hepatobiliary: There are multiple hepatic hypodense lesions which demonstrate somewhat ill-defined margins. The largest lesion is in the dome of the liver and measures 2.0 x 1.5 cm. These lesions are not well characterized but are concerning for metastatic disease. Further evaluation with MRI without and with contrast is recommended. Mild periportal edema. No biliary ductal dilatation. The gallbladder is mildly distended. No calcified gallstone identified. Pancreas: There is a 3.5 x 2.8 x 2.8 cm hypoenhancing mass in the head and uncinate process of the pancreas. There is associated gland atrophy with mild dilatation of the main pancreatic duct. Spleen: Choose 1 Adrenals/Urinary Tract: The adrenal glands appear unremarkable. Mild bilateral renal atrophy. Subcentimeter right renal upper pole hypodense lesion is not well characterized but most likely represents a cyst. There is no hydronephrosis on either side. The visualized ureters appear unremarkable. There is thickened and trabecular appearance of the urinary bladder likely related to chronic bladder outlet obstruction. Correlation with urinalysis recommended to exclude UTI. Stomach/Bowel: There is sigmoid diverticulosis with muscular hypertrophy. Underlying colonic mass is not excluded. Follow-up with colonoscopy recommended. Mild haziness of the sigmoid colon may be chronic or represent mild diverticulitis. Scattered colonic diverticula noted. There is no evidence of bowel obstruction. Normal appendix. Vascular/Lymphatic: There is moderate aortoiliac atherosclerotic disease. The abdominal aorta and IVC are otherwise unremarkable. The origins of the celiac axis, SMA, IMA as well as the origins of the renal arteries appear patent. There is no infiltration of the fat surrounding the celiac axis or SMA. The SMV, splenic vein, and main portal vein are patent. No portal venous gas identified. A nodular density measuring 7 mm in short anterior  to the pancreatic mass may represent a slightly enlarged lymph node or a satellite lesion. No other adenopathy identified. A smaller nodular density noted posterior to the pancreatic mass and anterior to the IVC (series 2, image 24). Reproductive: Heterogeneous prostate gland. Other: There is loss of subcutaneous fat predominantly involving the lower chest and cachexia. Musculoskeletal: Degenerative changes of the spine. Disc desiccation with vacuum phenomenon in the lower lumbar spine. No acute fracture. 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 Further evaluation with MRI without and with contrast and MRCP recommended. Small nodular density anterior to the pancreatic mass is concerning for satellite lesions. Multiple hepatic hypodense lesions most compatible with metastatic disease. No evidence of celiac or SMA encasement. Sigmoid diverticulosis with muscular hypertrophy. Mild sigmoid diverticulitis is not excluded. No bowel obstruction. Normal appendix. Follow-up with nonemergent colonoscopy recommended to exclude underlying sigmoid mass. Electronically Signed   By: Anner Crete M.D.   On: 02/29/2016 00:20    Impression/Plan: 75 yo with  newly diagnosed pancreatic head and uncinate process mass with liver mets. Discussed case with Dr. Paulita Fujita who reviewed CT and recommended IR evaluation for liver biopsy of one of the lesions. If IR not successful with biopsy of a liver lesion, then we will reevaluate for EUS of the pancreas. No obstructive jaundice to warrant a biliary stent at this time. Will see patient again if needed. Thank you for this consultation.    LOS: 0 days   [place] C.  02/29/2016, 4:43 PM  Pager [phone]  If no answer or after 5 PM call [phone]

## 2016-02-29 NOTE — H&P (Signed)
History and Physical    Reginald Ochoa [id number] DOB: 1940-08-22 DOA: 02/28/2016  Referring MD/NP/PA:   PCP: Elyn Peers, MD   Patient coming from:  The patient is coming from home.  At baseline, pt is independent for most of ADL.   Chief Complaint: Diarrhea, syncope  HPI: Reginald Ochoa is a 75 y.o. male with medical history significant of hypertension, asthma, depression, anxiety, fall due to orthostatic status, GI bleeding, tobacco abuse, former alcoholism, who presents with diarrhea and syncope.  Patient states that he has been having diarrhea for almost 10 days. He has 2 to 3  bowel movements with watery non-bloody stool every day. Denies nausea, vomiting or abdominal pain. He has chills, but no fever. He states that he fell lightheaded while walking and then passed out for a few seconds. EMS was flagged down by someone who found the patient on the ground. Per EMS, patient was tachycardic with heart rate up to 120s and blood pressure 90 systolic. They sat him up and his heart rate went to the 0000000 with systolic in the [id number]. He was given an IV and 300 mL of normal saline. His bp improved to148/96 with IVF in ED. He was found to have positive orthostatic vital signs in ED. Patient denies unilateral weakness, numbness in extremities. No vision change or hearing loss. No headache, chest pain. No symptoms of a UTI  ED Course: pt was found to have WBC 9.8, negative urinalysis, AKI with cre 1.34, temperature normal, tachycardia, negative chest x-ray, negative CT head for acute intracranial abnormalities. Negative chest x-ray. CT abdomen/pelvis showed possible pancreatic cancer with possible liver metastasis. Pt is admitted to the telemetry bed as inpatient.  Review of Systems:   General: no fevers, chills, no changes in body weight, has poor appetite, has fatigue HEENT: no blurry vision, hearing changes or sore throat Respiratory: no dyspnea, has coughing, no wheezing CV: no chest  pain, no palpitations GI: no nausea, vomiting, abdominal pain, has diarrhea, no constipation GU: no dysuria, burning on urination, increased urinary frequency, hematuria  Ext: no leg edema Neuro: no unilateral weakness, numbness, or tingling, no vision change or hearing loss. Had fall and syncope. Skin: no rash, no skin tear. MSK: No muscle spasm, no deformity, no limitation of range of movement in spin Heme: No easy bruising.  Travel history: No recent long distant travel.  Allergy: No Known Allergies  Past Medical History:  Diagnosis Date  . Alcohol abuse   . Asthma   . Depression with anxiety   . GIB (gastrointestinal bleeding)   . Hypertension   . Tobacco abuse     Past Surgical History:  Procedure Laterality Date  . bleeding ulcer      Social History:  reports that he has been smoking Cigarettes.  He has a 20.00 pack-year smoking history. He has never used smokeless tobacco. His alcohol and drug histories are not on file.  Family History:  Family History  Problem Relation Age of Onset  . Stroke Mother      Prior to Admission medications   Medication Sig Start Date End Date Taking? Authorizing Provider  albuterol (PROVENTIL HFA;VENTOLIN HFA) 108 (90 BASE) MCG/ACT inhaler Inhale 2 puffs into the lungs every 6 (six) hours as needed for wheezing or shortness of breath.    Historical Provider, MD  ALPRAZolam Duanne Moron) 1 MG tablet Take 0.5-1 mg by mouth 2 (two) times daily as needed. Anxiety     Historical Provider, MD  amphetamine-dextroamphetamine (ADDERALL)  10 MG tablet Take 15 mg by mouth every morning.     Historical Provider, MD  atorvastatin (LIPITOR) 20 MG tablet Take 20 mg by mouth daily.    Historical Provider, MD  doxycycline (VIBRAMYCIN) 100 MG capsule Take 1 capsule (100 mg total) by mouth 2 (two) times daily. Patient not taking: Reported on 05/09/2014 03/05/12   Shanon Rosser, MD  FLUoxetine (PROZAC) 40 MG capsule Take 40 mg by mouth daily with breakfast.       Historical Provider, MD  HYDROcodone-acetaminophen (LORTAB) 10-500 MG per tablet Take 1 tablet by mouth every 8 (eight) hours as needed. For pain     Historical Provider, MD  HYDROcodone-homatropine (HYCODAN) 5-1.5 MG/5ML syrup Take 5 mLs by mouth every 4 (four) hours as needed for cough or pain. 03/05/12   John Molpus, MD  mirtazapine (REMERON) 45 MG tablet Take 45 mg by mouth at bedtime.      Historical Provider, MD  Multiple Vitamins-Minerals (MULTIVITAMIN WITH MINERALS) tablet Take 1-2 tablets by mouth daily.    Historical Provider, MD  spironolactone (ALDACTONE) 25 MG tablet Take 25 mg by mouth 2 (two) times daily.      Historical Provider, MD  temazepam (RESTORIL) 30 MG capsule Take 30 mg by mouth at bedtime as needed. For sleep     Historical Provider, MD    Physical Exam: Vitals:   02/29/16 0045 02/29/16 0100 02/29/16 0115 02/29/16 0319  BP:    (!) 151/98  Pulse:  107 103 (!) 104  Resp:    18  Temp:    99.1 F (37.3 C)  TempSrc:    Oral  SpO2: 100% 100% 100% 100%  Weight:      Height:       General: Not in acute distress HEENT:       Eyes: PERRL, EOMI, no scleral icterus.       ENT: No discharge from the ears and nose, no pharynx injection, no tonsillar enlargement.        Neck: No JVD, no bruit, no mass felt. Heme: No neck lymph node enlargement. Cardiac: S1/S2, RRR, No murmurs, No gallops or rubs. Respiratory: No rales, wheezing, rhonchi or rubs. GI: Soft, nondistended, nontender, no rebound pain, no organomegaly, BS present. GU: No hematuria Ext: No pitting leg edema bilaterally. 2+DP/PT pulse bilaterally. Musculoskeletal: No joint deformities, No joint redness or warmth, no limitation of ROM in spin. Skin: No rashes.  Neuro: Alert, oriented X3, cranial nerves II-XII grossly intact, moves all extremities normally. Muscle strength 5/5 in all extremities, sensation to light touch intact.  Knee reflex 1+ bilaterally. Negative Babinski's sign. Normal finger to nose  test. Psych: Patient is not psychotic, no suicidal or hemocidal ideation.  Labs on Admission: I have personally reviewed following labs and imaging studies  CBC:  Recent Labs Lab 02/28/16 1657  WBC 9.8  NEUTROABS 7.3  HGB 11.5*  HCT 33.8*  MCV 87.6  PLT [id number]   Basic Metabolic Panel:  Recent Labs Lab 02/28/16 1657  NA 136  K 3.8  CL 103  CO2 25  GLUCOSE 108*  BUN 15  CREATININE 1.34*  CALCIUM 8.6*   GFR: Estimated Creatinine Clearance: 45.2 mL/min (by C-G formula based on SCr of 1.34 mg/dL (H)). Liver Function Tests:  Recent Labs Lab 02/28/16 1657  AST 21  ALT 16*  ALKPHOS 69  BILITOT 0.9  PROT 5.7*  ALBUMIN 3.2*    Recent Labs Lab 02/28/16 1657  LIPASE 25   No results for  input(s): AMMONIA in the last 168 hours. Coagulation Profile: No results for input(s): INR, PROTIME in the last 168 hours. Cardiac Enzymes: No results for input(s): CKTOTAL, CKMB, CKMBINDEX, TROPONINI in the last 168 hours. BNP (last 3 results) No results for input(s): PROBNP in the last 8760 hours. HbA1C: No results for input(s): HGBA1C in the last 72 hours. CBG:  Recent Labs Lab 02/29/16 0316  GLUCAP 132*   Lipid Profile: No results for input(s): CHOL, HDL, LDLCALC, TRIG, CHOLHDL, LDLDIRECT in the last 72 hours. Thyroid Function Tests: No results for input(s): TSH, T4TOTAL, FREET4, T3FREE, THYROIDAB in the last 72 hours. Anemia Panel: No results for input(s): VITAMINB12, FOLATE, FERRITIN, TIBC, IRON, RETICCTPCT in the last 72 hours. Urine analysis:    Component Value Date/Time   COLORURINE YELLOW 02/28/2016 [place] 02/28/2016 1830   LABSPEC 1.013 02/28/2016 1830   PHURINE 6.0 02/28/2016 1830   GLUCOSEU NEGATIVE 02/28/2016 1830   HGBUR NEGATIVE 02/28/2016 1830   BILIRUBINUR NEGATIVE 02/28/2016 1830   KETONESUR 15 (A) 02/28/2016 1830   PROTEINUR NEGATIVE 02/28/2016 1830   UROBILINOGEN 0.2 02/11/2009 1534   NITRITE NEGATIVE 02/28/2016 1830    LEUKOCYTESUR NEGATIVE 02/28/2016 1830   Sepsis Labs: @LABRCNTIP (procalcitonin:4,lacticidven:4) )No results found for this or any previous visit (from the past 240 hour(s)).   Radiological Exams on Admission: Dg Chest 2 View  Result Date: 02/29/2016 CLINICAL DATA:  Initial valuation for acute cough. EXAM: CHEST  2 VIEW COMPARISON:  None. FINDINGS: Cardiac and mediastinal silhouettes are within normal limits. Lungs normally inflated. No focal trait, pulmonary edema, or pleural effusion. No pneumothorax. No acute osseous abnormality. IMPRESSION: No active cardiopulmonary disease. Electronically Signed   By: Jeannine Boga M.D.   On: 02/29/2016 03:07   Ct Head Wo Contrast  Result Date: 02/28/2016 CLINICAL DATA:  Hit top of head now with small hematoma. EXAM: CT HEAD WITHOUT CONTRAST TECHNIQUE: Contiguous axial images were obtained from the base of the skull through the vertex without intravenous contrast. COMPARISON:  05/09/2014 FINDINGS: Brain: Similar findings of advanced atrophy with sulcal prominence centralized volume loss with commensurate ex vacuo dilatation of the ventricular system. Stable sequela of prior right PCA distribution infarct with associated asymmetric ex vacuo dilatation involving the occipital horn of the right lateral ventricle. Scattered minimal periventricular hypodensities compatible microvascular ischemic disease. Given extensive background parenchymal abnormalities, there is no CT evidence superimposed acute large territory infarct. Punctate (approximately 3 mm) suspected meningioma about the inner table of the right frontal cortex (coronal image 26, series 205). Otherwise, no intraparenchymal or extra-axial mass. No intraparenchymal or extra-axial hemorrhage. Unchanged size and configuration of the ventricles and the basilar cisterns. No midline shift. Vascular: Intracranial atherosclerosis. Skull: No displaced calvarial fracture. Sinuses/Orbits: There is  underpneumatization the bilateral frontal sinuses. A tooth is again noted to be impacted within the caudal aspect of the left maxillary sinus. The remaining paranasal sinuses and mastoid air cells are normally aerated. No air-fluid levels. Post left-sided cataract surgery. Other: Unchanged dermal calcification is noted about the right parietal calvarium (image 19, series 205). Regional soft tissues appear normal. No radiopaque foreign body. 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 Electronically Signed   By: Sandi Mariscal M.D.   On: 02/28/2016 17:51   Ct Abdomen Pelvis W Contrast  Result Date: 02/29/2016 CLINICAL DATA:  75 year old male with diarrhea and diffuse abdominal pain. EXAM: CT ABDOMEN AND PELVIS WITH CONTRAST TECHNIQUE: Multidetector CT  imaging of the abdomen and pelvis was performed using the standard protocol following bolus administration of intravenous contrast. CONTRAST:  27mL ISOVUE-300 IOPAMIDOL (ISOVUE-300) INJECTION 61% COMPARISON:  None. FINDINGS: Lower chest: The visualized lung bases are clear. No intra-abdominal free air.  Trace perihepatic free fluid. Hepatobiliary: There are multiple hepatic hypodense lesions which demonstrate somewhat ill-defined margins. The largest lesion is in the dome of the liver and measures 2.0 x 1.5 cm. These lesions are not well characterized but are concerning for metastatic disease. Further evaluation with MRI without and with contrast is recommended. Mild periportal edema. No biliary ductal dilatation. The gallbladder is mildly distended. No calcified gallstone identified. Pancreas: There is a 3.5 x 2.8 x 2.8 cm hypoenhancing mass in the head and uncinate process of the pancreas. There is associated gland atrophy with mild dilatation of the main pancreatic duct. Spleen: Choose 1 Adrenals/Urinary Tract: The adrenal glands appear unremarkable.  Mild bilateral renal atrophy. Subcentimeter right renal upper pole hypodense lesion is not well characterized but most likely represents a cyst. There is no hydronephrosis on either side. The visualized ureters appear unremarkable. There is thickened and trabecular appearance of the urinary bladder likely related to chronic bladder outlet obstruction. Correlation with urinalysis recommended to exclude UTI. Stomach/Bowel: There is sigmoid diverticulosis with muscular hypertrophy. Underlying colonic mass is not excluded. Follow-up with colonoscopy recommended. Mild haziness of the sigmoid colon may be chronic or represent mild diverticulitis. Scattered colonic diverticula noted. There is no evidence of bowel obstruction. Normal appendix. Vascular/Lymphatic: There is moderate aortoiliac atherosclerotic disease. The abdominal aorta and IVC are otherwise unremarkable. The origins of the celiac axis, SMA, IMA as well as the origins of the renal arteries appear patent. There is no infiltration of the fat surrounding the celiac axis or SMA. The SMV, splenic vein, and main portal vein are patent. No portal venous gas identified. A nodular density measuring 7 mm in short anterior to the pancreatic mass may represent a slightly enlarged lymph node or a satellite lesion. No other adenopathy identified. A smaller nodular density noted posterior to the pancreatic mass and anterior to the IVC (series 2, image 24). Reproductive: Heterogeneous prostate gland. Other: There is loss of subcutaneous fat predominantly involving the lower chest and cachexia. Musculoskeletal: Degenerative changes of the spine. Disc desiccation with vacuum phenomenon in the lower lumbar spine. No acute fracture. 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 Further evaluation with MRI without and with contrast and MRCP recommended.  Small nodular density anterior to the pancreatic mass is concerning for satellite lesions. Multiple hepatic hypodense lesions most compatible with metastatic disease. No evidence of celiac or SMA encasement. Sigmoid diverticulosis with muscular hypertrophy. Mild sigmoid diverticulitis is not excluded. No bowel obstruction. Normal appendix. Follow-up with nonemergent colonoscopy recommended to exclude underlying sigmoid mass. Electronically Signed   By: Anner Crete M.D.   On: 02/29/2016 00:20     EKG: Independently reviewed.  Not done in ED, will get one.   Assessment/Plan Principal Problem:   Syncope Active Problems:   Pancreatic mass   Hypertension   Asthma   Tobacco abuse   Depression with anxiety   Fall   Diarrhea   AKI (acute kidney injury) ([place])  Syncope and fall: most likely due to orthostatic status secondary to dehydration from diarrhea. Patient is positive for orthostatic vital signs. No focal neurological findings on physical examination. CT head is negative for  acute intracranial abnormalities. - admit to tele bed as inpt -IVF: 2 LNS and 1l of lactated Ringer solution-->then 75 cc/h - PT/OT eval and treat  Pancreatic mass: Very concerning for pancreatic cancer with liver metastasis -will get MRI of abdomen for further evaluation -check CA-19-9 -please call oncology for consultation  Diarrhea: Etiology is not clear. Differential diagnoses include viral eneritis, C. difficile colitis, and insulinoma -check c diff pcr and Gi path panel -IVf as above  HTN: used to be on spironolactone, currently not taking medications. - Hydralazine when necessary  Asthma: stable.  -prn albuterol nebulizers -Hycodan for cough  Tobacco abuse: -Did counseling about importance of quitting smoking -Nicotine patch  Depression and anxiety: Stable, no suicidal or homicidal ideations. -Continue home medications: Xanax, Prozac, mirtazapine  AKI: Likely due to prerenal secondary to  dehydration. - IVF as above - Check FeNa - Follow up renal function by BMP - Avoid ACEI and NSAIDs   DVT ppx: SCD Code Status: Full code Family Communication:  Yes, patient's wife at bed side Disposition Plan:  Anticipate discharge back to previous home environment Consults called:  none Admission status:  Inpatient/tele    Date of Service 02/29/2016    Jamesmichael Shadd, [facility] Pager [phone]  If 7PM-7AM, please contact night-coverage [email or web address] Password [email or web address] 02/29/2016, 4:36 AM

## 2016-03-01 ENCOUNTER — Telehealth: Payer: Self-pay | Admitting: *Deleted

## 2016-03-01 ENCOUNTER — Inpatient Hospital Stay (HOSPITAL_COMMUNITY): Payer: Medicare Other

## 2016-03-01 LAB — GLUCOSE, CAPILLARY
GLUCOSE-CAPILLARY: 81 mg/dL (ref 65–99)
Glucose-Capillary: 90 mg/dL (ref 65–99)
Glucose-Capillary: 108 mg/dL — ABNORMAL HIGH (ref 65–99)
Glucose-Capillary: 142 mg/dL — ABNORMAL HIGH (ref 65–99)

## 2016-03-01 LAB — CEA: CEA: 10.5 ng/mL — ABNORMAL HIGH (ref 0.0–4.7)

## 2016-03-01 LAB — CANCER ANTIGEN 19-9: CA 19 9: 2 U/mL (ref 0–35)

## 2016-03-01 LAB — UREA NITROGEN, URINE: UREA NITROGEN UR: 322 mg/dL

## 2016-03-01 MED ORDER — FENTANYL CITRATE (PF) 100 MCG/2ML IJ SOLN
INTRAMUSCULAR | Status: AC
  Filled 2016-03-01: qty 2

## 2016-03-01 MED ORDER — LIDOCAINE HCL (PF) 1 % IJ SOLN
INTRAMUSCULAR | Status: AC
  Filled 2016-03-01: qty 10

## 2016-03-01 MED ORDER — MIDAZOLAM HCL 2 MG/2ML IJ SOLN
INTRAMUSCULAR | Status: AC
  Filled 2016-03-01: qty 2

## 2016-03-01 MED ORDER — GELATIN ABSORBABLE 12-7 MM EX MISC
CUTANEOUS | Status: AC
  Filled 2016-03-01: qty 1

## 2016-03-01 MED ORDER — FENTANYL CITRATE (PF) 100 MCG/2ML IJ SOLN
INTRAMUSCULAR | Status: AC | PRN
  Administered 2016-03-01: 50 ug via INTRAVENOUS
  Administered 2016-03-01: 25 ug via INTRAVENOUS

## 2016-03-01 MED ORDER — MIDAZOLAM HCL 2 MG/2ML IJ SOLN
INTRAMUSCULAR | Status: AC | PRN
  Administered 2016-03-01: 0.5 mg via INTRAVENOUS
  Administered 2016-03-01: 1 mg via INTRAVENOUS

## 2016-03-01 NOTE — Progress Notes (Signed)
Verified with Levada Dy, Case Manager patient is okay to discharge and case management will follow up regarding home PT on Monday. Verbal instructions given to patient and family with verbal understanding.

## 2016-03-01 NOTE — Sedation Documentation (Signed)
Patient denies pain and is resting comfortably.  

## 2016-03-01 NOTE — Progress Notes (Signed)
Physical Therapy Treatment Patient Details Name: Reginald Ochoa MRN: [id number] DOB: 1940-06-10 Today's Date: 03/01/2016    History of Present Illness Patient is a 75 y/o male with hx of HTN, tobacco abuse, alcohol abuse, depression and asthma presents with diarrhea and syncope. Positive orthostatic vital signs in ED. CT abdomen/pelvis showed possible pancreatic cancer with possible liver metastasis    PT Comments    Patient tolerated increased gait distance and stair training with supervision/min guard for all mobility. Current plan remains appropriate.   Follow Up Recommendations  Home health PT;Supervision - Intermittent     Equipment Recommendations  None recommended by PT    Recommendations for Other Services OT consult     Precautions / Restrictions Precautions Precautions: Fall    Mobility  Bed Mobility               General bed mobility comments: Pt OOB in chair upon arrival  Transfers Overall transfer level: Needs assistance Equipment used: None Transfers: Sit to/from Stand Sit to Stand: Supervision         General transfer comment: supervision for safety  Ambulation/Gait Ambulation/Gait assistance: Min guard Ambulation Distance (Feet): 200 Feet Assistive device: Straight cane Gait Pattern/deviations: Shuffle;Step-through pattern;Decreased stride length;Trunk flexed Gait velocity: decreased   General Gait Details: shuffling gait initially but able to correct with cues for increased bilat step length and heel strike; cues for sequencing of gait with use of AD   Stairs Stairs: Yes Stairs assistance: Min guard Stair Management: One rail Left;With cane;Step to pattern Number of Stairs: 5 General stair comments: cues for sequencing and for safety  Wheelchair Mobility    Modified Rankin (Stroke Patients Only)       Balance     Sitting balance-Leahy Scale: Good       Standing balance-Leahy Scale: Fair                       Cognition Arousal/Alertness: Awake/alert Behavior During Therapy: WFL for tasks assessed/performed Overall Cognitive Status: Within Functional Limits for tasks assessed                      Exercises      General Comments General comments (skin integrity, edema, etc.): step son present      Pertinent Vitals/Pain Pain Assessment: No/denies pain    Home Living                      Prior Function            PT Goals (current goals can now be found in the care plan section) Acute Rehab PT Goals Patient Stated Goal: go home Progress towards PT goals: Progressing toward goals    Frequency    Min 3X/week      PT Plan Current plan remains appropriate    Co-evaluation             End of Session Equipment Utilized During Treatment: Gait belt Activity Tolerance: Patient tolerated treatment well Patient left: in chair;with call bell/phone within reach;with family/visitor present     Time: [id number] PT Time Calculation (min) (ACUTE ONLY): 28 min  Charges:  $Gait Training: 8-22 mins $Therapeutic Activity: 8-22 mins                    G Codes:      Salina April, PTA Pager: [phone]   03/01/2016, 4:57 PM

## 2016-03-01 NOTE — Sedation Documentation (Signed)
Patient is resting comfortably. 

## 2016-03-01 NOTE — Sedation Documentation (Signed)
Dr Kathlene Cote aware goes from sinus tach around 115 to sinus rhythm in the 80's, back and forth.

## 2016-03-01 NOTE — Progress Notes (Signed)
OT Cancellation Note  Patient Details Name: Reginald Ochoa MRN: [id number] DOB: 01-Jul-1940   Cancelled Treatment:    Reason Eval/Treat Not Completed: Patient at procedure or test/ unavailable.  Will reattempt  [facility], OTR/L [id number]   Lucille Passy M 03/01/2016, 12:14 PM

## 2016-03-01 NOTE — Telephone Encounter (Signed)
Oncology Nurse Navigator Documentation  Oncology Nurse Navigator Flowsheets 03/01/2016  Navigator Location [facility]  Referral date to RadOnc/MedOnc 03/01/2016--per Dr. Broadus John  Navigator Encounter Type Introductory phone call  Abnormal Finding Date 02/29/2016  Spoke with son, [name] and provided new patient appointment for 03/08/16 at 1115/1130 with Dr. Burr Medico. Informed of location of [facility], valet service, and registration process. Reminded to bring insurance cards and a current medication list, including supplements. Son verbalizes understanding and is aware of location (he was former patient of Dr. Beryle Beams).

## 2016-03-01 NOTE — Discharge Summary (Signed)
Physician Discharge Summary  Reginald Ochoa [id number] DOB: 1940-11-12 DOA: 02/28/2016  PCP: Elyn Peers, MD  Admit date: 02/28/2016 Discharge date: 03/01/2016  Time spent: 45 minutes  Recommendations for Outpatient Follow-up:  1. Oncology Dr.Feng on 10/27 at [facility] 2. [facility] to follow up biopsy results and pursue EUS with biopsy if liver biopsy is non diagnostic-results pending at discharge   Discharge Diagnoses:  Principal Problem:   Syncope Active Problems:   Pancreatic mass   Hypertension   Asthma   Tobacco abuse   Depression with anxiety   Fall   Diarrhea   AKI (acute kidney injury) ([place])   Alcohol abuse   Discharge Condition: stable  Diet recommendation: low sodium  Filed Weights   02/28/16 1652  Weight: 72.6 kg (160 lb)    History of present illness:  Reginald Magsino Trotteris a 75 y.o.malewith medical history significant of hypertension, asthma, depression, anxiety, fall due to orthostatic status, GI bleeding, tobacco abuse, former alcoholism, who presented with diarrhea and syncope.  He was found to have positive orthostatic vital signs in ED. CT abdomen/pelvis showed possible pancreatic cancerwith possible liver metastasis  Hospital Course:  Syncope and fall: -likely due to orthostatic hypotension secondary to dehydration from diarrhea and diuretics - CT head is negative for acute intracranial abnormalities. -improved with hydration, no events on tele -s/p Pt eval, Home health PT recommended and set up prior to discharge  Pancreatic mass:Very concerning for pancreatic cancer with liver metastasis -[facility] consulted, recommended Ct guided liver biopsy by IR, this was completed today, since he is stable, non obstructed and without symptoms, he is being discharged and in case the biopsy is nondiagnostic, Dr.Schooler or Dr.Buccini will set him up for a EUS and Biopsy as outpatient, this was discussed with Dr.Schooler today. 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 - diuretics stopped, BP soft/stable  Asthma: stable.  -prn albuterol nebulizers  Tobacco abuse: -Did counseling about importance of quitting smoking  Depression and anxiety: -Continue home medications: Xanax, Prozac, mirtazapine  AKI: - prerenal secondary to dehydration. -resolved with IVF   Procedures:  CT guided Liver biopsy  Consultations:  [facility] [place]  IR  Discharge Exam: Vitals:   03/01/16 1115 03/01/16 1131  BP: (!) 143/93 (!) 144/91  Pulse: 89 (!) 113  Resp: 20 18  Temp:      General: AAOx3 Cardiovascular:S1S2/RRR Respiratory: CTAB  Discharge Instructions   Discharge Instructions    Diet - low sodium heart healthy    Complete by:  As directed    Increase activity slowly    Complete by:  As directed      Current Discharge Medication List    CONTINUE these medications which have NOT CHANGED   Details  albuterol (PROVENTIL HFA;VENTOLIN HFA) 108 (90 BASE) MCG/ACT inhaler Inhale 2 puffs into the lungs every 6 (six) hours as needed for wheezing or shortness of breath.    ALPRAZolam (XANAX) 1 MG tablet Take 0.5-1 mg by mouth 2 (two) times daily as needed for anxiety. Anxiety     amphetamine-dextroamphetamine (ADDERALL) 10 MG tablet Take 15 mg by mouth every morning.     cetirizine (ZYRTEC) 10 MG tablet Take 10 mg by mouth at bedtime.    FLUoxetine (PROZAC) 40 MG capsule  Take 40 mg by mouth daily with breakfast.      HYDROcodone-acetaminophen (NORCO) 10-325 MG tablet Take 1 tablet by mouth daily as needed for moderate pain.     LUTEIN PO Take 1 tablet by mouth daily.    Multiple Vitamins-Minerals (MULTIVITAMIN WITH MINERALS) tablet Take 1 tablet by  mouth daily.     OVER THE COUNTER MEDICATION Place 2 sprays into both nostrils as needed (for congestion). [facility] Nasal Spray for congestion      STOP taking these medications     valsartan-hydrochlorothiazide (DIOVAN-HCT) 80-12.5 MG tablet      doxycycline (VIBRAMYCIN) 100 MG capsule      HYDROcodone-homatropine (HYCODAN) 5-1.5 MG/5ML syrup      atorvastatin (LIPITOR) 20 MG tablet      HYDROcodone-acetaminophen (LORTAB) 10-500 MG per tablet      mirtazapine (REMERON) 45 MG tablet      temazepam (RESTORIL) 30 MG capsule        No Known Allergies Follow-up Information    Truitt Merle, MD Follow up on 03/08/2016.   Specialties:  Hematology, Oncology Contact information: [place] [address] [address] .   Why:  You will get a call from the [place] office if you need a repeat Biopsy           The results of significant diagnostics from this hospitalization (including imaging, microbiology, ancillary and laboratory) are listed below for reference.    Significant Diagnostic Studies: Dg Chest 2 View  Result Date: 02/29/2016 CLINICAL DATA:  Initial valuation for acute cough. EXAM: CHEST  2 VIEW COMPARISON:  None. FINDINGS: Cardiac and mediastinal silhouettes are within normal limits. Lungs normally inflated. No focal trait, pulmonary edema, or pleural effusion. No pneumothorax. No acute osseous abnormality. IMPRESSION: No active cardiopulmonary disease. Electronically Signed   By: Jeannine Boga M.D.   On: 02/29/2016 03:07   Ct Head Wo Contrast  Result Date: 02/28/2016 CLINICAL DATA:  Hit top of head now with small hematoma. EXAM: CT HEAD WITHOUT CONTRAST TECHNIQUE: Contiguous axial images were obtained from the base of the skull through the vertex without intravenous contrast. COMPARISON:  05/09/2014 FINDINGS: Brain: Similar findings of advanced atrophy with sulcal prominence centralized volume loss with commensurate ex vacuo dilatation  of the ventricular system. Stable sequela of prior right PCA distribution infarct with associated asymmetric ex vacuo dilatation involving the occipital horn of the right lateral ventricle. Scattered minimal periventricular hypodensities compatible microvascular ischemic disease. Given extensive background parenchymal abnormalities, there is no CT evidence superimposed acute large territory infarct. Punctate (approximately 3 mm) suspected meningioma about the inner table of the right frontal cortex (coronal image 26, series 205). Otherwise, no intraparenchymal or extra-axial mass. No intraparenchymal or extra-axial hemorrhage. Unchanged size and configuration of the ventricles and the basilar cisterns. No midline shift. Vascular: Intracranial atherosclerosis. Skull: No displaced calvarial fracture. Sinuses/Orbits: There is underpneumatization the bilateral frontal sinuses. A tooth is again noted to be impacted within the caudal aspect of the left maxillary sinus. The remaining paranasal sinuses and mastoid air cells are normally aerated. No air-fluid levels. Post left-sided cataract surgery. Other: Unchanged dermal calcification is noted about the right parietal calvarium (image 19, series 205). Regional soft tissues appear normal. No radiopaque foreign body. 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 Electronically Signed   By: Sandi Mariscal M.D.   On: 02/28/2016 17:51  Ct Chest W Contrast  Result Date: 03/01/2016 CLINICAL DATA:  History of pancreatic mass and asthma EXAM: CT CHEST WITH CONTRAST TECHNIQUE: Multidetector CT imaging of the chest was performed during intravenous contrast administration. CONTRAST:  64mL ISOVUE-300 IOPAMIDOL (ISOVUE-300) INJECTION 61% COMPARISON:  02/29/2016 FINDINGS: Cardiovascular: Thoracic aorta show some mild atherosclerotic calcifications without aneurysmal  dilatation or dissection. Mild coronary calcifications are seen. Pulmonary artery is incompletely evaluated. Cardiac structures show no acute abnormality. Mediastinum/Nodes: The thoracic inlet is within normal limits. No significant hilar or mediastinal adenopathy is noted. No axillary adenopathy is seen. The esophagus as visualized is within normal limits. Lungs/Pleura: Mild emphysematous changes are noted. No focal infiltrate or sizable effusion is seen. No nodular changes are noted. Upper Abdomen: The upper abdomen demonstrates multiple ring-enhancing lesions consistent with hepatic metastatic disease. The known pancreatic head mass is incompletely evaluated on this exam. Musculoskeletal: Degenerative changes of the thoracic spine are noted. No definitive osseous metastatic disease is seen. IMPRESSION: Changes consistent with the known hepatic metastatic disease. No acute intrathoracic abnormality is noted. Mild emphysematous changes. Electronically Signed   By: Inez Catalina M.D.   On: 03/01/2016 07:30   Ct Abdomen Pelvis W Contrast  Result Date: 02/29/2016 CLINICAL DATA:  75 year old male with diarrhea and diffuse abdominal pain. EXAM: CT ABDOMEN AND PELVIS WITH CONTRAST TECHNIQUE: Multidetector CT imaging of the abdomen and pelvis was performed using the standard protocol following bolus administration of intravenous contrast. CONTRAST:  77mL ISOVUE-300 IOPAMIDOL (ISOVUE-300) INJECTION 61% COMPARISON:  None. FINDINGS: Lower chest: The visualized lung bases are clear. No intra-abdominal free air.  Trace perihepatic free fluid. Hepatobiliary: There are multiple hepatic hypodense lesions which demonstrate somewhat ill-defined margins. The largest lesion is in the dome of the liver and measures 2.0 x 1.5 cm. These lesions are not well characterized but are concerning for metastatic disease. Further evaluation with MRI without and with contrast is recommended. Mild periportal edema. No biliary ductal  dilatation. The gallbladder is mildly distended. No calcified gallstone identified. Pancreas: There is a 3.5 x 2.8 x 2.8 cm hypoenhancing mass in the head and uncinate process of the pancreas. There is associated gland atrophy with mild dilatation of the main pancreatic duct. Spleen: Choose 1 Adrenals/Urinary Tract: The adrenal glands appear unremarkable. Mild bilateral renal atrophy. Subcentimeter right renal upper pole hypodense lesion is not well characterized but most likely represents a cyst. There is no hydronephrosis on either side. The visualized ureters appear unremarkable. There is thickened and trabecular appearance of the urinary bladder likely related to chronic bladder outlet obstruction. Correlation with urinalysis recommended to exclude UTI. Stomach/Bowel: There is sigmoid diverticulosis with muscular hypertrophy. Underlying colonic mass is not excluded. Follow-up with colonoscopy recommended. Mild haziness of the sigmoid colon may be chronic or represent mild diverticulitis. Scattered colonic diverticula noted. There is no evidence of bowel obstruction. Normal appendix. Vascular/Lymphatic: There is moderate aortoiliac atherosclerotic disease. The abdominal aorta and IVC are otherwise unremarkable. The origins of the celiac axis, SMA, IMA as well as the origins of the renal arteries appear patent. There is no infiltration of the fat surrounding the celiac axis or SMA. The SMV, splenic vein, and main portal vein are patent. No portal venous gas identified. A nodular density measuring 7 mm in short anterior to the pancreatic mass may represent a slightly enlarged lymph node or a satellite lesion. No other adenopathy identified. A smaller nodular density noted posterior to the pancreatic mass and anterior to the IVC (series 2, image 24). Reproductive: Heterogeneous  prostate gland. Other: There is loss of subcutaneous fat predominantly involving the lower chest and cachexia. Musculoskeletal: Degenerative  changes of the spine. Disc desiccation with vacuum phenomenon in the lower lumbar spine. No acute fracture. 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 Further evaluation with MRI without and with contrast and MRCP recommended. Small nodular density anterior to the pancreatic mass is concerning for satellite lesions. Multiple hepatic hypodense lesions most compatible with metastatic disease. No evidence of celiac or SMA encasement. Sigmoid diverticulosis with muscular hypertrophy. Mild sigmoid diverticulitis is not excluded. No bowel obstruction. Normal appendix. Follow-up with nonemergent colonoscopy recommended to exclude underlying sigmoid mass. Electronically Signed   By: Anner Crete M.D.   On: 02/29/2016 00:20    Microbiology: Recent Results (from the past 240 hour(s))  C difficile quick scan w PCR reflex     Status: None   Collection Time: 02/29/16  4:03 AM  Result Value Ref Range Status   C Diff antigen NEGATIVE NEGATIVE Final   C Diff toxin NEGATIVE NEGATIVE Final   C Diff interpretation No C. difficile detected.  Final  Gastrointestinal Panel by PCR , Stool     Status: None   Collection Time: 02/29/16  4:03 AM  Result Value Ref Range Status   Campylobacter species NOT DETECTED NOT DETECTED Final   Plesimonas shigelloides NOT DETECTED NOT DETECTED Final   Salmonella species NOT DETECTED NOT DETECTED Final   Yersinia enterocolitica NOT DETECTED NOT DETECTED Final   Vibrio species NOT DETECTED NOT DETECTED Final   Vibrio cholerae NOT DETECTED NOT DETECTED Final   Enteroaggregative E coli (EAEC) NOT DETECTED NOT DETECTED Final   Enteropathogenic E coli (EPEC) NOT DETECTED NOT DETECTED Final   Enterotoxigenic E coli (ETEC) NOT DETECTED NOT DETECTED Final   Shiga like toxin producing E coli (STEC) NOT DETECTED NOT DETECTED Final   Shigella/Enteroinvasive E  coli (EIEC) NOT DETECTED NOT DETECTED Final   Cryptosporidium NOT DETECTED NOT DETECTED Final   Cyclospora cayetanensis NOT DETECTED NOT DETECTED Final   Entamoeba histolytica NOT DETECTED NOT DETECTED Final   Giardia lamblia NOT DETECTED NOT DETECTED Final   Adenovirus F40/41 NOT DETECTED NOT DETECTED Final   Astrovirus NOT DETECTED NOT DETECTED Final   Norovirus GI/GII NOT DETECTED NOT DETECTED Final   Rotavirus A NOT DETECTED NOT DETECTED Final   Sapovirus (I, II, IV, and V) NOT DETECTED NOT DETECTED Final     Labs: Basic Metabolic Panel:  Recent Labs Lab 02/28/16 1657 02/29/16 0454  NA 136 139  K 3.8 3.8  CL 103 106  CO2 25 27  GLUCOSE 108* 114*  BUN 15 10  CREATININE 1.34* 1.12  CALCIUM 8.6* 8.4*   Liver Function Tests:  Recent Labs Lab 02/28/16 1657 02/29/16 0454  AST 21 23  ALT 16* 17  ALKPHOS 69 67  BILITOT 0.9 0.8  PROT 5.7* 5.6*  ALBUMIN 3.2* 3.2*    Recent Labs Lab 02/28/16 1657  LIPASE 25   No results for input(s): AMMONIA in the last 168 hours. CBC:  Recent Labs Lab 02/28/16 1657 02/29/16 0454  WBC 9.8 8.0  NEUTROABS 7.3  --   HGB 11.5* 12.0*  HCT 33.8* 35.3*  MCV 87.6 88.0  PLT 171 179   Cardiac Enzymes: No results for input(s): CKTOTAL, CKMB, CKMBINDEX, TROPONINI in the last 168 hours. BNP: BNP (last 3 results) No results for input(s): BNP in the  last 8760 hours.  ProBNP (last 3 results) No results for input(s): PROBNP in the last 8760 hours.  CBG:  Recent Labs Lab 02/29/16 0316 02/29/16 0845 03/01/16 0644 03/01/16 0811 03/01/16 1207  GLUCAP 132* 99 81 90 108*       SignedDomenic Polite MD.  [facility] 03/01/2016, 3:42 PM

## 2016-03-01 NOTE — Progress Notes (Signed)
PT Cancellation Note  Patient Details Name: Reginald Ochoa MRN: [id number] DOB: 1940/05/23   Cancelled Treatment:    Reason Eval/Treat Not Completed: Patient at procedure or test/unavailablePt off unit for US biopsy. PT will check on pt later as time allows.    Salina April, PTA Pager: [phone]   03/01/2016, 9:25 AM

## 2016-03-01 NOTE — Sedation Documentation (Signed)
In sinus tach at 115, and then sinus rhythm in the 80's

## 2016-03-04 NOTE — Progress Notes (Signed)
Spoke with patient and his son over the phone, they decline [facility] services. They verbalize understanding that they can follow up with PCP for future HH needs.

## 2016-03-05 ENCOUNTER — Emergency Department (HOSPITAL_COMMUNITY): Payer: Medicare Other

## 2016-03-05 ENCOUNTER — Observation Stay (HOSPITAL_COMMUNITY)
Admission: EM | Admit: 2016-03-05 | Discharge: 2016-03-07 | Disposition: A | Discharge status: Home / Self Care | Payer: Medicare Other | Attending: Internal Medicine | Admitting: Internal Medicine

## 2016-03-05 ENCOUNTER — Encounter (HOSPITAL_COMMUNITY): Payer: Self-pay | Admitting: Emergency Medicine

## 2016-03-05 DIAGNOSIS — F17210 Nicotine dependence, cigarettes, uncomplicated: Secondary | ICD-10-CM | POA: Insufficient documentation

## 2016-03-05 DIAGNOSIS — R0603 Acute respiratory distress: Secondary | ICD-10-CM | POA: Diagnosis not present

## 2016-03-05 DIAGNOSIS — Z720 Tobacco use: Secondary | ICD-10-CM

## 2016-03-05 DIAGNOSIS — J45901 Unspecified asthma with (acute) exacerbation: Secondary | ICD-10-CM | POA: Insufficient documentation

## 2016-03-05 DIAGNOSIS — R0602 Shortness of breath: Secondary | ICD-10-CM

## 2016-03-05 DIAGNOSIS — K869 Disease of pancreas, unspecified: Secondary | ICD-10-CM | POA: Diagnosis not present

## 2016-03-05 DIAGNOSIS — K8689 Other specified diseases of pancreas: Secondary | ICD-10-CM | POA: Diagnosis present

## 2016-03-05 DIAGNOSIS — R9431 Abnormal electrocardiogram [ECG] [EKG]: Secondary | ICD-10-CM | POA: Insufficient documentation

## 2016-03-05 DIAGNOSIS — Z79899 Other long term (current) drug therapy: Secondary | ICD-10-CM | POA: Diagnosis not present

## 2016-03-05 DIAGNOSIS — F1010 Alcohol abuse, uncomplicated: Secondary | ICD-10-CM | POA: Diagnosis not present

## 2016-03-05 DIAGNOSIS — R000 Tachycardia, unspecified: Secondary | ICD-10-CM | POA: Insufficient documentation

## 2016-03-05 DIAGNOSIS — E860 Dehydration: Secondary | ICD-10-CM | POA: Diagnosis not present

## 2016-03-05 DIAGNOSIS — Z23 Encounter for immunization: Secondary | ICD-10-CM | POA: Insufficient documentation

## 2016-03-05 DIAGNOSIS — I491 Atrial premature depolarization: Secondary | ICD-10-CM

## 2016-03-05 DIAGNOSIS — F418 Other specified anxiety disorders: Secondary | ICD-10-CM | POA: Insufficient documentation

## 2016-03-05 DIAGNOSIS — J4521 Mild intermittent asthma with (acute) exacerbation: Secondary | ICD-10-CM | POA: Diagnosis not present

## 2016-03-05 DIAGNOSIS — Z7982 Long term (current) use of aspirin: Secondary | ICD-10-CM | POA: Insufficient documentation

## 2016-03-05 DIAGNOSIS — I10 Essential (primary) hypertension: Secondary | ICD-10-CM | POA: Insufficient documentation

## 2016-03-05 HISTORY — DX: Other specified diseases of pancreas: K86.89

## 2016-03-05 LAB — COMPREHENSIVE METABOLIC PANEL
ALBUMIN: 3.6 g/dL (ref 3.5–5.0)
ALT: 21 U/L (ref 17–63)
ANION GAP: 11 (ref 5–15)
AST: 29 U/L (ref 15–41)
Alkaline Phosphatase: 67 U/L (ref 38–126)
BILIRUBIN TOTAL: 0.7 mg/dL (ref 0.3–1.2)
BUN: 13 mg/dL (ref 6–20)
CHLORIDE: 104 mmol/L (ref 101–111)
CO2: 25 mmol/L (ref 22–32)
Calcium: 8.4 mg/dL — ABNORMAL LOW (ref 8.9–10.3)
Creatinine, Ser: 1.2 mg/dL (ref 0.61–1.24)
GFR calc Af Amer: >60 mL/min (ref >60)
GFR, EST NON AFRICAN AMERICAN: 58 mL/min — AB (ref >60)
GLUCOSE: 126 mg/dL — AB (ref 65–99)
POTASSIUM: 3.7 mmol/L (ref 3.5–5.1)
Sodium: 140 mmol/L (ref 135–145)
TOTAL PROTEIN: 6.2 g/dL — AB (ref 6.5–8.1)

## 2016-03-05 LAB — I-STAT TROPONIN, ED: Troponin i, poc: 0.01 ng/mL (ref 0.00–0.08)

## 2016-03-05 LAB — LIPASE, BLOOD: LIPASE: 27 U/L (ref 11–51)

## 2016-03-05 LAB — CBC
HEMATOCRIT: 37.8 % — AB (ref 39.0–52.0)
Hemoglobin: 13 g/dL (ref 13.0–17.0)
MCH: 30.2 pg (ref 26.0–34.0)
MCHC: 34.4 g/dL (ref 30.0–36.0)
MCV: 87.7 fL (ref 78.0–100.0)
PLATELETS: 217 10*3/uL (ref 150–400)
RBC: 4.31 MIL/uL (ref 4.22–5.81)
RDW: 13 % (ref 11.5–15.5)
WBC: 8.9 10*3/uL (ref 4.0–10.5)

## 2016-03-05 LAB — D-DIMER, QUANTITATIVE (NOT AT ARMC): D DIMER QUANT: 0.66 ug{FEU}/mL — AB (ref 0.00–0.50)

## 2016-03-05 MED ORDER — AMPHETAMINE-DEXTROAMPHETAMINE 10 MG PO TABS
5.0000 mg | ORAL_TABLET | Freq: Two times a day (BID) | ORAL | Status: DC
  Administered 2016-03-05 – 2016-03-07 (×3): 10 mg via ORAL
  Filled 2016-03-05 (×5): qty 1

## 2016-03-05 MED ORDER — ONDANSETRON HCL 4 MG PO TABS: 4.0000 mg | ORAL_TABLET | Freq: Four times a day (QID) | ORAL | Status: DC | PRN

## 2016-03-05 MED ORDER — METHYLPREDNISOLONE SODIUM SUCC 40 MG IJ SOLR
40.0000 mg | Freq: Four times a day (QID) | INTRAMUSCULAR | Status: AC
  Administered 2016-03-05 – 2016-03-06 (×4): 40 mg via INTRAVENOUS
  Filled 2016-03-05 (×4): qty 1

## 2016-03-05 MED ORDER — ALPRAZOLAM 0.25 MG PO TABS
0.5000 mg | ORAL_TABLET | Freq: Two times a day (BID) | ORAL | Status: DC | PRN
  Administered 2016-03-05 – 2016-03-07 (×2): 0.5 mg via ORAL
  Filled 2016-03-05 (×2): qty 2

## 2016-03-05 MED ORDER — IPRATROPIUM BROMIDE 0.02 % IN SOLN
0.5000 mg | Freq: Once | RESPIRATORY_TRACT | Status: AC
  Administered 2016-03-05: 0.5 mg via RESPIRATORY_TRACT
  Filled 2016-03-05: qty 2.5

## 2016-03-05 MED ORDER — HYDROCHLOROTHIAZIDE 12.5 MG PO CAPS: 12.5000 mg | ORAL_CAPSULE | Freq: Every day | ORAL | Status: DC

## 2016-03-05 MED ORDER — ALBUTEROL SULFATE (2.5 MG/3ML) 0.083% IN NEBU: 2.5000 mg | INHALATION_SOLUTION | RESPIRATORY_TRACT | Status: DC | PRN

## 2016-03-05 MED ORDER — ONDANSETRON HCL 4 MG/2ML IJ SOLN: 4.0000 mg | Freq: Four times a day (QID) | INTRAMUSCULAR | Status: DC | PRN

## 2016-03-05 MED ORDER — VALSARTAN-HYDROCHLOROTHIAZIDE 80-12.5 MG PO TABS: 1.0000 | ORAL_TABLET | Freq: Every day | ORAL | Status: DC

## 2016-03-05 MED ORDER — LORATADINE 10 MG PO TABS
10.0000 mg | ORAL_TABLET | Freq: Every day | ORAL | Status: DC
  Administered 2016-03-05 – 2016-03-07 (×3): 10 mg via ORAL
  Filled 2016-03-05 (×3): qty 1

## 2016-03-05 MED ORDER — LEVOFLOXACIN 500 MG PO TABS: 500.0000 mg | ORAL_TABLET | Freq: Every day | ORAL | Status: DC

## 2016-03-05 MED ORDER — INFLUENZA VAC SPLIT QUAD 0.5 ML IM SUSY
0.5000 mL | PREFILLED_SYRINGE | INTRAMUSCULAR | Status: AC
  Administered 2016-03-06: 0.5 mL via INTRAMUSCULAR
  Filled 2016-03-05: qty 0.5

## 2016-03-05 MED ORDER — ALBUTEROL SULFATE (2.5 MG/3ML) 0.083% IN NEBU
2.5000 mg | INHALATION_SOLUTION | RESPIRATORY_TRACT | Status: DC
  Administered 2016-03-05: 2.5 mg via RESPIRATORY_TRACT
  Filled 2016-03-05: qty 3

## 2016-03-05 MED ORDER — ACETAMINOPHEN 650 MG RE SUPP: 650.0000 mg | Freq: Four times a day (QID) | RECTAL | Status: DC | PRN

## 2016-03-05 MED ORDER — ACETAMINOPHEN 325 MG PO TABS: 650.0000 mg | ORAL_TABLET | Freq: Four times a day (QID) | ORAL | Status: DC | PRN

## 2016-03-05 MED ORDER — METHYLPREDNISOLONE SODIUM SUCC 125 MG IJ SOLR
125.0000 mg | Freq: Once | INTRAMUSCULAR | Status: AC
  Administered 2016-03-05: 125 mg via INTRAVENOUS
  Filled 2016-03-05: qty 2

## 2016-03-05 MED ORDER — SENNOSIDES-DOCUSATE SODIUM 8.6-50 MG PO TABS: 1.0000 | ORAL_TABLET | Freq: Every evening | ORAL | Status: DC | PRN

## 2016-03-05 MED ORDER — ALBUTEROL SULFATE (2.5 MG/3ML) 0.083% IN NEBU
5.0000 mg | INHALATION_SOLUTION | Freq: Once | RESPIRATORY_TRACT | Status: AC
  Administered 2016-03-05: 5 mg via RESPIRATORY_TRACT
  Filled 2016-03-05: qty 6

## 2016-03-05 MED ORDER — FLUOXETINE HCL 20 MG PO CAPS
40.0000 mg | ORAL_CAPSULE | Freq: Every day | ORAL | Status: DC
  Administered 2016-03-06 – 2016-03-07 (×2): 40 mg via ORAL
  Filled 2016-03-05 (×2): qty 2

## 2016-03-05 MED ORDER — METHYLPREDNISOLONE SODIUM SUCC 40 MG IJ SOLR: 40.0000 mg | Freq: Four times a day (QID) | INTRAMUSCULAR | Status: DC

## 2016-03-05 MED ORDER — IRBESARTAN 75 MG PO TABS
75.0000 mg | ORAL_TABLET | Freq: Every day | ORAL | Status: DC
  Filled 2016-03-05: qty 1

## 2016-03-05 MED ORDER — ENOXAPARIN SODIUM 40 MG/0.4ML ~~LOC~~ SOLN
40.0000 mg | SUBCUTANEOUS | Status: DC
  Administered 2016-03-05 – 2016-03-06 (×2): 40 mg via SUBCUTANEOUS
  Filled 2016-03-05 (×2): qty 0.4

## 2016-03-05 MED ORDER — ALBUTEROL SULFATE (2.5 MG/3ML) 0.083% IN NEBU
2.5000 mg | INHALATION_SOLUTION | Freq: Four times a day (QID) | RESPIRATORY_TRACT | Status: DC
  Administered 2016-03-05 – 2016-03-06 (×2): 2.5 mg via RESPIRATORY_TRACT
  Filled 2016-03-05 (×2): qty 3

## 2016-03-05 MED ORDER — MOMETASONE FURO-FORMOTEROL FUM 200-5 MCG/ACT IN AERO
2.0000 | INHALATION_SPRAY | Freq: Two times a day (BID) | RESPIRATORY_TRACT | Status: DC
  Administered 2016-03-05 – 2016-03-06 (×3): 2 via RESPIRATORY_TRACT
  Filled 2016-03-05: qty 8.8

## 2016-03-05 MED ORDER — PNEUMOCOCCAL VAC POLYVALENT 25 MCG/0.5ML IJ INJ
0.5000 mL | INJECTION | INTRAMUSCULAR | Status: AC
  Administered 2016-03-06: 0.5 mL via INTRAMUSCULAR
  Filled 2016-03-05: qty 0.5

## 2016-03-05 MED ORDER — LUTEIN 6 MG PO CAPS: ORAL_CAPSULE | Freq: Every day | ORAL | Status: DC

## 2016-03-05 MED ORDER — HYDROCODONE-ACETAMINOPHEN 10-325 MG PO TABS
1.0000 | ORAL_TABLET | Freq: Every day | ORAL | Status: DC | PRN
Start: 2016-03-05 — End: 2016-03-07

## 2016-03-05 MED ORDER — ASPIRIN 325 MG PO TABS: 325.0000 mg | ORAL_TABLET | Freq: Four times a day (QID) | ORAL | Status: DC | PRN

## 2016-03-05 MED ORDER — GUAIFENESIN ER 600 MG PO TB12
600.0000 mg | ORAL_TABLET | Freq: Two times a day (BID) | ORAL | Status: DC
  Administered 2016-03-05 – 2016-03-07 (×5): 600 mg via ORAL
  Filled 2016-03-05 (×5): qty 1

## 2016-03-05 MED ORDER — SODIUM CHLORIDE 0.9% FLUSH
3.0000 mL | Freq: Two times a day (BID) | INTRAVENOUS | Status: DC
  Administered 2016-03-05 – 2016-03-07 (×5): 3 mL via INTRAVENOUS

## 2016-03-05 MED ORDER — SODIUM CHLORIDE 0.9 % IV SOLN
INTRAVENOUS | Status: AC
  Administered 2016-03-05: 11:00:00 via INTRAVENOUS

## 2016-03-05 NOTE — ED Notes (Signed)
Family at bedside. 

## 2016-03-05 NOTE — ED Provider Notes (Signed)
[place] DEPT Provider Note   CSN: [id number] Arrival date & time: 03/05/16  [id number]     History   Chief Complaint Chief Complaint  Patient presents with  . Shortness of Breath    The patient said he woke out of his sleep with SOB.  The patient denies any pain.  He had asthma as a child.  He has not taken anything.  He does have a history of smoking.    HPI LENNART FERGEN is a 75 y.o. male.  HPI Mr. Ruter is a 75 year old man history of alcohol abuse, asthma, tobacco abuse, hypertension discharged 4 days ago after admission for fall who presents today with sudden onset of dyspnea that woke him from his sleep this morning. He states that he has had similar episodes in the past. He denies any chest pain but feels it is uncomfortable and try to take a deep breath. He has had cough at baseline without change in color. He denies fever or chills. He injured his head when he fell but has not had any change in head injury or headache, neck pain, abdominal pain, nausea, vomiting, or diarrhea. Past Medical History:  Diagnosis Date  . Alcohol abuse   . Asthma   . Depression with anxiety   . GIB (gastrointestinal bleeding)   . Hypertension   . Tobacco abuse     Patient Active Problem List   Diagnosis Date Noted  . Pancreatic mass 02/29/2016  . Tobacco abuse 02/29/2016  . Depression with anxiety 02/29/2016  . Fall 02/29/2016  . Syncope 02/29/2016  . Diarrhea 02/29/2016  . AKI (acute kidney injury) ([place]) 02/29/2016  . Hypertension   . Asthma   . Alcohol abuse     Past Surgical History:  Procedure Laterality Date  . bleeding ulcer         Home Medications    Prior to Admission medications   Medication Sig Start Date End Date Taking? Authorizing Provider  albuterol (PROVENTIL HFA;VENTOLIN HFA) 108 (90 BASE) MCG/ACT inhaler Inhale 2 puffs into the lungs every 6 (six) hours as needed for wheezing or shortness of breath.    Historical Provider, MD  ALPRAZolam Duanne Moron) 1  MG tablet Take 0.5-1 mg by mouth 2 (two) times daily as needed for anxiety. Anxiety     Historical Provider, MD  amphetamine-dextroamphetamine (ADDERALL) 10 MG tablet Take 15 mg by mouth every morning.     Historical Provider, MD  cetirizine (ZYRTEC) 10 MG tablet Take 10 mg by mouth at bedtime.    Historical Provider, MD  FLUoxetine (PROZAC) 40 MG capsule Take 40 mg by mouth daily with breakfast.      Historical Provider, MD  HYDROcodone-acetaminophen (NORCO) 10-325 MG tablet Take 1 tablet by mouth daily as needed for moderate pain.  02/06/16   Historical Provider, MD  LUTEIN PO Take 1 tablet by mouth daily.    Historical Provider, MD  Multiple Vitamins-Minerals (MULTIVITAMIN WITH MINERALS) tablet Take 1 tablet by mouth daily.     Historical Provider, MD  OVER THE COUNTER MEDICATION Place 2 sprays into both nostrils as needed (for congestion). [facility] Nasal Spray for congestion    Historical Provider, MD    Family History Family History  Problem Relation Age of Onset  . Stroke Mother     Social History Social History  Substance Use Topics  . Smoking status: Current Every Day Smoker    Packs/day: 0.50    Years: 40.00    Types: Cigarettes  . Smokeless tobacco:  Never Used  . Alcohol use Not on file     Allergies   Review of patient's allergies indicates no known allergies.   Review of Systems Review of Systems  All other systems reviewed and are negative.    Physical Exam Updated Vital Signs BP (!) 169/114 (BP Location: Right Arm)   Pulse 109   Temp 98.5 F (36.9 C) (Oral)   Resp (!) 28   SpO2 100%   Physical Exam  Constitutional: He is oriented to person, place, and time. He appears well-developed and well-nourished. He appears distressed.  HENT:  Head: Normocephalic and atraumatic.  Right Ear: External ear normal.  Left Ear: External ear normal.  Nose: Nose normal.  Mouth/Throat: Oropharynx is clear and moist.  Eyes: EOM are normal. Pupils are equal, round, and  reactive to light.  Eyes are dysconjugate with right eye deviation laterally, perrl.   Neck: Normal range of motion. Neck supple.  Cardiovascular: Tachycardia present.   Pulmonary/Chest: He has wheezes. He has rales.  Decreased breath sounds right base  Abdominal: Soft. Bowel sounds are normal. He exhibits no distension and no mass. There is no tenderness. There is no guarding.  Musculoskeletal: Normal range of motion. He exhibits no edema, tenderness or deformity.  Neurological: He is alert and oriented to person, place, and time. He has normal reflexes.  Skin: Skin is warm. Capillary refill takes less than 2 seconds.  Psychiatric: He has a normal mood and affect.  Vitals reviewed.    ED Treatments / Results  Labs (all labs ordered are listed, but only abnormal results are displayed) Labs Reviewed  CBC  COMPREHENSIVE METABOLIC PANEL  LIPASE, BLOOD  I-STAT TROPOININ, ED    EKG  EKG Interpretation  Date/Time:  Tuesday March 05 2016 08:33:32 EDT Ventricular Rate:  121 PR Interval:    QRS Duration: 98 QT Interval:  357 QTC Calculation: 507 R Axis:   175 Text Interpretation:  Sinus or ectopic atrial tachycardia Atrial premature complex Right axis deviation Prolonged QT interval Confirmed by Lang Zingg MD, Andee Poles [phone]) on 03/05/2016 8:36:16 AM       Radiology Dg Chest Port 1 View  Result Date: 03/05/2016 CLINICAL DATA:  Shortness of breath, no chest pain EXAM: PORTABLE CHEST 1 VIEW COMPARISON:  Chest x-Denis Carreon 02/29/2016 FINDINGS: The heart size and mediastinal contours are within normal limits. Both lungs are clear. The visualized skeletal structures are unremarkable. IMPRESSION: No active disease. Electronically Signed   By: Kathreen Devoid   On: 03/05/2016 08:23    Procedures Procedures (including critical care time)  Medications Ordered in ED Medications  albuterol (PROVENTIL) (2.5 MG/3ML) 0.083% nebulizer solution 5 mg (not administered)  ipratropium (ATROVENT) nebulizer  solution 0.5 mg (not administered)  methylPREDNISolone sodium succinate (SOLU-MEDROL) 125 mg/2 mL injection 125 mg (not administered)     Initial Impression / Assessment and Plan / ED Course  I have reviewed the triage vital signs and the nursing notes.  Pertinent labs & imaging results that were available during my care of the patient were reviewed by me and considered in my medical decision making (see chart for details). 8:44 AM Patient reassessed and feels improved after first nap. Continues to have poor inspiratory movement with diffuse rhonchi and expiratory wheezes. Heart rate 121. Will repeat albuterol but will likely require admission 9:51 AM Lungs clear with improved air movement. Heart rate 90s laying down. Patient sits up and heart rate goes up to 1:30 with sats decreasing to 93%. Plan admission for further  treatment and will add d-dimer. Clinical Course     10:02 AM Discussed with Dyanne Carrel.  Plan tele, obs., Dr. Marily Memos Discussed pending d-dimer and possible need for pe work up.  Final Clinical Impressions(s) / ED Diagnoses   Final diagnoses:  SOB (shortness of breath)    New Prescriptions New Prescriptions   No medications on file     Pattricia Boss, MD 03/06/16 1136

## 2016-03-05 NOTE — H&P (Signed)
History and Physical    Reginald Ochoa [id number] DOB: 06-02-1940 DOA: 03/05/2016  PCP: Elyn Peers, MD Patient coming from: home  Chief Complaint:   HPI: Reginald Ochoa is a 75 y.o. male with medical history significant for  EtOH abuse asthma tobacco use hypertension pression anxiety orthostatic hypotension presents to the emergency department with chief complaint of sudden worsening shortness of breath. Initial evaluation reveals acute respiratory distress likely related to an asthma exacerbation and tachycardia.  Information is obtained from the patient. He states he was doing well since being discharged from home 4 days ago when he awakened this morning with sudden shortness of breath. He denies chest pain palpitations headache dizziness syncope or near-syncope. He denies worsening cough or increased sputum production. He denies fever chills recent travel. He states he has inhalers at home but he ran out "several days ago". He denies abdominal pain nausea vomiting. He reports eating and drinking his normal amount. He denies dysuria hematuria frequency or urgency. Denies diarrhea constipation melena or bright red blood per rectum. He denies lower extremity edema orthopnea.    ED Course: In the emergency department he's afebrile hypertensive tachypnea and tachycardia demonstrates increased oxygen demand but no documented hypoxia  Review of Systems: As per HPI otherwise 10 point review of systems negative.   Ambulatory Status: Ambulates with a cane at home. Recently admitted for fall related to orthostatics status in the setting of dehydration from diarrhea  Past Medical History:  Diagnosis Date  . Alcohol abuse   . Asthma   . Depression with anxiety   . GIB (gastrointestinal bleeding)   . Hypertension   . Pancreatic mass   . Tobacco abuse     Past Surgical History:  Procedure Laterality Date  . bleeding ulcer      Social History   Social History  . Marital  status: Married    Spouse name: N/A  . Number of children: N/A  . Years of education: N/A   Occupational History  . Not on file.   Social History Main Topics  . Smoking status: Current Every Day Smoker    Packs/day: 0.50    Years: 40.00    Types: Cigarettes  . Smokeless tobacco: Never Used  . Alcohol use Not on file  . Drug use: Unknown  . Sexual activity: Not on file   Other Topics Concern  . Not on file   Social History Narrative  . No narrative on file    No Known Allergies  Family History  Problem Relation Age of Onset  . Stroke Mother     Prior to Admission medications   Medication Sig Start Date End Date Taking? Authorizing Provider  albuterol (PROVENTIL HFA;VENTOLIN HFA) 108 (90 BASE) MCG/ACT inhaler Inhale 2 puffs into the lungs every 6 (six) hours as needed for wheezing or shortness of breath.   Yes Historical Provider, MD  ALPRAZolam Duanne Moron) 1 MG tablet Take 0.5-1 mg by mouth 2 (two) times daily as needed for anxiety. Anxiety    Yes Historical Provider, MD  amphetamine-dextroamphetamine (ADDERALL) 10 MG tablet Take 5-10 mg by mouth 2 (two) times daily. 10mg  in the morning and 5mg  at lunch.   Yes Historical Provider, MD  aspirin 325 MG tablet Take 325 mg by mouth every 6 (six) hours as needed for headache.   Yes Historical Provider, MD  budesonide-formoterol (SYMBICORT) 160-4.5 MCG/ACT inhaler Inhale 2 puffs into the lungs 2 (two) times daily.   Yes Historical Provider, MD  cetirizine (  ZYRTEC) 10 MG tablet Take 10 mg by mouth at bedtime.   Yes Historical Provider, MD  FLUoxetine (PROZAC) 40 MG capsule Take 40 mg by mouth daily with breakfast.     Yes Historical Provider, MD  HYDROcodone-acetaminophen (NORCO) 10-325 MG tablet Take 1 tablet by mouth daily as needed for moderate pain.  02/06/16  Yes Historical Provider, MD  LUTEIN PO Take 1 tablet by mouth daily.   Yes Historical Provider, MD  Multiple Vitamins-Minerals (MULTIVITAMIN WITH MINERALS) tablet Take 1 tablet  by mouth daily.    Yes Historical Provider, MD  OVER THE COUNTER MEDICATION Place 2 sprays into both nostrils as needed (for congestion). [facility] Nasal Spray for congestion   Yes Historical Provider, MD  valsartan-hydrochlorothiazide (DIOVAN-HCT) 80-12.5 MG tablet Take 1 tablet by mouth daily. 02/18/16  Yes Historical Provider, MD    Physical Exam: Vitals:   03/05/16 0930 03/05/16 0945 03/05/16 1000 03/05/16 1015  BP: (!) 151/101 (!) 123/102 149/100 135/95  Pulse: 97 (!) 133 (!) 133 71  Resp: 23 22 23 13   Temp:      TempSrc:      SpO2: 97% 94% 94% 98%     General:  Appears calm and comfortable thin frail slightly pale and chronically ill appearing Eyes:  PERRL, EOMI, normal lids, iris ENT:  grossly normal hearing, lips & tongue, mucous membranes of his mouth slightly dry but pink Neck:  no LAD, masses or thyromegaly Cardiovascular:  Tachycardia with some ectopic beat, no m/r/g. No LE edema. Pedal pulses present and palpable Respiratory:  Normal effort. Breath sounds are quite diminished throughout. Here no crackles no wheeze Abdomen:  soft, ntnd, positive bowel sounds nontender to palpation Skin:  no rash or induration seen on limited exam Musculoskeletal:  grossly normal tone BUE/BLE, good ROM, no bony abnormality Psychiatric:  grossly normal mood and affect, speech fluent and appropriate, AOx3 Neurologic:  CN 2-12 grossly intact, moves all extremities in coordinated fashion, sensation intact  Labs on Admission: I have personally reviewed following labs and imaging studies  CBC:  Recent Labs Lab 02/28/16 1657 02/29/16 0454 03/05/16 0739  WBC 9.8 8.0 8.9  NEUTROABS 7.3  --   --   HGB 11.5* 12.0* 13.0  HCT 33.8* 35.3* 37.8*  MCV 87.6 88.0 87.7  PLT 171 179 [id number]   Basic Metabolic Panel:  Recent Labs Lab 02/28/16 1657 02/29/16 0454 03/05/16 0739  NA 136 139 140  K 3.8 3.8 3.7  CL 103 106 104  CO2 25 27 25   GLUCOSE 108* 114* 126*  BUN 15 10 13   CREATININE 1.34* 1.12  1.20  CALCIUM 8.6* 8.4* 8.4*   GFR: Estimated Creatinine Clearance: 50.5 mL/min (by C-G formula based on SCr of 1.2 mg/dL). Liver Function Tests:  Recent Labs Lab 02/28/16 1657 02/29/16 0454 03/05/16 0739  AST 21 23 29   ALT 16* 17 21  ALKPHOS 69 67 67  BILITOT 0.9 0.8 0.7  PROT 5.7* 5.6* 6.2*  ALBUMIN 3.2* 3.2* 3.6    Recent Labs Lab 02/28/16 1657 03/05/16 0739  LIPASE 25 27   No results for input(s): AMMONIA in the last 168 hours. Coagulation Profile:  Recent Labs Lab 02/29/16 0454  INR 1.12   Cardiac Enzymes: No results for input(s): CKTOTAL, CKMB, CKMBINDEX, TROPONINI in the last 168 hours. BNP (last 3 results) No results for input(s): PROBNP in the last 8760 hours. HbA1C: No results for input(s): HGBA1C in the last 72 hours. CBG:  Recent Labs Lab 02/29/16 0845 03/01/16 [id number]  03/01/16 0811 03/01/16 1207 03/01/16 1713  GLUCAP 99 81 90 108* 142*   Lipid Profile: No results for input(s): CHOL, HDL, LDLCALC, TRIG, CHOLHDL, LDLDIRECT in the last 72 hours. Thyroid Function Tests: No results for input(s): TSH, T4TOTAL, FREET4, T3FREE, THYROIDAB in the last 72 hours. Anemia Panel: No results for input(s): VITAMINB12, FOLATE, FERRITIN, TIBC, IRON, RETICCTPCT in the last 72 hours. Urine analysis:    Component Value Date/Time   COLORURINE YELLOW 02/28/2016 [place] 02/28/2016 1830   LABSPEC 1.013 02/28/2016 1830   PHURINE 6.0 02/28/2016 1830   GLUCOSEU NEGATIVE 02/28/2016 1830   HGBUR NEGATIVE 02/28/2016 1830   BILIRUBINUR NEGATIVE 02/28/2016 1830   KETONESUR 15 (A) 02/28/2016 1830   PROTEINUR NEGATIVE 02/28/2016 1830   UROBILINOGEN 0.2 02/11/2009 1534   NITRITE NEGATIVE 02/28/2016 1830   LEUKOCYTESUR NEGATIVE 02/28/2016 1830    Creatinine Clearance: Estimated Creatinine Clearance: 50.5 mL/min (by C-G formula based on SCr of 1.2 mg/dL).  Sepsis Labs: @LABRCNTIP (procalcitonin:4,lacticidven:4) ) Recent Results (from the past 240  hour(s))  C difficile quick scan w PCR reflex     Status: None   Collection Time: 02/29/16  4:03 AM  Result Value Ref Range Status   C Diff antigen NEGATIVE NEGATIVE Final   C Diff toxin NEGATIVE NEGATIVE Final   C Diff interpretation No C. difficile detected.  Final  Gastrointestinal Panel by PCR , Stool     Status: None   Collection Time: 02/29/16  4:03 AM  Result Value Ref Range Status   Campylobacter species NOT DETECTED NOT DETECTED Final   Plesimonas shigelloides NOT DETECTED NOT DETECTED Final   Salmonella species NOT DETECTED NOT DETECTED Final   Yersinia enterocolitica NOT DETECTED NOT DETECTED Final   Vibrio species NOT DETECTED NOT DETECTED Final   Vibrio cholerae NOT DETECTED NOT DETECTED Final   Enteroaggregative E coli (EAEC) NOT DETECTED NOT DETECTED Final   Enteropathogenic E coli (EPEC) NOT DETECTED NOT DETECTED Final   Enterotoxigenic E coli (ETEC) NOT DETECTED NOT DETECTED Final   Shiga like toxin producing E coli (STEC) NOT DETECTED NOT DETECTED Final   Shigella/Enteroinvasive E coli (EIEC) NOT DETECTED NOT DETECTED Final   Cryptosporidium NOT DETECTED NOT DETECTED Final   Cyclospora cayetanensis NOT DETECTED NOT DETECTED Final   Entamoeba histolytica NOT DETECTED NOT DETECTED Final   Giardia lamblia NOT DETECTED NOT DETECTED Final   Adenovirus F40/41 NOT DETECTED NOT DETECTED Final   Astrovirus NOT DETECTED NOT DETECTED Final   Norovirus GI/GII NOT DETECTED NOT DETECTED Final   Rotavirus A NOT DETECTED NOT DETECTED Final   Sapovirus (I, II, IV, and V) NOT DETECTED NOT DETECTED Final     Radiological Exams on Admission: Dg Chest Port 1 View  Result Date: 03/05/2016 CLINICAL DATA:  Shortness of breath, no chest pain EXAM: PORTABLE CHEST 1 VIEW COMPARISON:  Chest x-ray 02/29/2016 FINDINGS: The heart size and mediastinal contours are within normal limits. Both lungs are clear. The visualized skeletal structures are unremarkable. IMPRESSION: No active disease.  Electronically Signed   By: Kathreen Devoid   On: 03/05/2016 08:23    EKG: Independently reviewed. Sinus or ectopic atrial tachycardia Atrial premature complex Right axis deviation Prolonged QT interval  Assessment/Plan Principal Problem:   Acute respiratory distress Active Problems:   Pancreatic mass   Hypertension   Asthma exacerbation   Tobacco abuse   Depression with anxiety   Alcohol abuse   Tachycardia   Abnormal EKG   #1. Acute respiratory distress likely  related to asthma exacerbation in setting of abnormal EKG. Patient continues to smoke. He reports having run out of his home inhaler. Unsure of any pulmonary function tests in the past. Not on oxygen at home. Increased oxygen demand in the emergency department. He is provided with nebulizers and Solu-Medrol emergency department and is much improved on admission -Admit to telemetry -Scheduled nebulizers every 4 hours -Solu-Medrol 40 mg every 6 -Continue Symbicort -Continue oxygen supplementation as indicated -Monitor oxygen saturation level -Wean oxygen as able -Likely benefit from outpatient pulmonary function tests  #2. Tachycardia/abnormal EKG. EKG with HB. No chest pain. Troponin negative. Heart rate drifts to high end of normal when patient sleeping. May be related to nebulizers and/or HB. -Repeat EKG in the a.m. -cycle troponin -cardiology consult  #3. Hypertension. Poor control in the emergency department. Home medications include Diovan HCT he does not think he stay can his meds this morning. -Resume home meds -Monitor closely  4. Pancreatic mass. Incidental finding on last hospitalization. Concerning for malignancy with liver metastases. During that hospitalization GI consulted and IR biopsy done. CEA elevated as well -Follow-up with [facility] arranged last hospitalization -has follow up scheduled with oncology 10/27  #5. Tobacco use. -Cessation counseling offered  #.6 Depression and anxiety. Appears stable at  baseline. Home medications include Xanax and Prozac - continue meds    DVT prophylaxis: lovenox  Code Status: full  Family Communication: none present  Disposition Plan: home  Consults called: cardiology cardmaster  Admission status: obs    Dyanne Carrel M MD [facility]  If 7PM-7AM, please contact night-coverage [email or web address] Password [email or web address]  03/05/2016, 10:55 AM

## 2016-03-05 NOTE — Progress Notes (Signed)
Patient trasfered from ED to [phone] via stretcher; alert and oriented x 4; no complaints of pain; IV saline locked in LFA; skin intact. Orient patient to room and unit; gave patient care guide; instructed how to use the call bell and  fall risk precautions. Tele monitor (box 18) placed by MD order. Will continue to monitor the patient.

## 2016-03-05 NOTE — Consult Note (Signed)
CARDIOLOGY CONSULT NOTE     Patient ID: Reginald Ochoa MRN: [id number] DOB/AGE: May 12, 1941 75 y.o.  Admit date: 03/05/2016 Referring Physician Linna Darner MD Primary Physician Elyn Peers, MD Primary Cardiologist new Reason for Consultation tachycardia  HPI: 75 yo WM with history of asthma, Etoh and tobacco abuse, and recently diagnosed metastatic adenoCA of pancreas/liver. Presented today with worsening SOB/severe wheezing. No chest pain. SOB improved with nebulizer therapy and steroids. Noted to be tachycardic with HR up to 133. Now 122. When admitted earlier in the week HR 103-107. He denies any palpitations, dizziness, or syncope. No history of cardiac disease.  Past Medical History:  Diagnosis Date  . Alcohol abuse   . Asthma   . Depression with anxiety   . GIB (gastrointestinal bleeding)   . Hypertension   . Pancreatic mass   . Tobacco abuse     Family History  Problem Relation Age of Onset  . Stroke Mother     Social History   Social History  . Marital status: Married    Spouse name: N/A  . Number of children: N/A  . Years of education: N/A   Occupational History  . Not on file.   Social History Main Topics  . Smoking status: Current Every Day Smoker    Packs/day: 0.50    Years: 40.00    Types: Cigarettes  . Smokeless tobacco: Never Used  . Alcohol use Yes     Comment: WEEKLY 2-3-BEERS   . Drug use: No  . Sexual activity: Not on file   Other Topics Concern  . Not on file   Social History Narrative  . No narrative on file    Past Surgical History:  Procedure Laterality Date  . bleeding ulcer    . TONSILLECTOMY       Prescriptions Prior to Admission  Medication Sig Dispense Refill Last Dose  . albuterol (PROVENTIL HFA;VENTOLIN HFA) 108 (90 BASE) MCG/ACT inhaler Inhale 2 puffs into the lungs every 6 (six) hours as needed for wheezing or shortness of breath.   Past Month at Unknown time  . ALPRAZolam (XANAX) 1 MG tablet Take 0.5-1 mg by  mouth 2 (two) times daily as needed for anxiety. Anxiety    03/04/2016 at Unknown time  . amphetamine-dextroamphetamine (ADDERALL) 10 MG tablet Take 5-10 mg by mouth 2 (two) times daily. 10mg  in the morning and 5mg  at lunch.   03/04/2016 at Unknown time  . aspirin 325 MG tablet Take 325 mg by mouth every 6 (six) hours as needed for headache.   03/04/2016 at Unknown time  . budesonide-formoterol (SYMBICORT) 160-4.5 MCG/ACT inhaler Inhale 2 puffs into the lungs 2 (two) times daily.   Past Week at prn  . cetirizine (ZYRTEC) 10 MG tablet Take 10 mg by mouth at bedtime.   03/04/2016 at Unknown time  . FLUoxetine (PROZAC) 40 MG capsule Take 40 mg by mouth daily with breakfast.     03/04/2016 at Unknown time  . HYDROcodone-acetaminophen (NORCO) 10-325 MG tablet Take 1 tablet by mouth daily as needed for moderate pain.    Past Week at Unknown time  . LUTEIN PO Take 1 tablet by mouth daily.   03/04/2016 at Unknown time  . Multiple Vitamins-Minerals (MULTIVITAMIN WITH MINERALS) tablet Take 1 tablet by mouth daily.    03/04/2016 at Unknown time  . OVER THE COUNTER MEDICATION Place 2 sprays into both nostrils as needed (for congestion). [facility] Nasal Spray for congestion   Past Week at Unknown time  .  valsartan-hydrochlorothiazide (DIOVAN-HCT) 80-12.5 MG tablet Take 1 tablet by mouth daily.  2 03/04/2016 at Unknown time     ROS: As noted in HPI. All other systems are reviewed and are negative unless otherwise mentioned.   Physical Exam: Blood pressure 135/95, pulse 71, temperature 98.5 F (36.9 C), temperature source Oral, resp. rate 13, height 5\' 7"  (1.702 m), weight 118 lb 11.2 oz (53.8 kg), SpO2 98 %. Current Weight  03/05/16 118 lb 11.2 oz (53.8 kg)  02/28/16 160 lb (72.6 kg)  05/09/14 147 lb (66.7 kg)    GENERAL:  Chronically ill appearing WM, thin, in NAD HEENT:  PERRL, EOMI, sclera are clear. Oropharynx is clear. NECK:  No jugular venous distention, carotid upstroke brisk and symmetric, no bruits, no  thyromegaly or adenopathy LUNGS:  Clear to auscultation bilaterally CHEST:  Unremarkable HEART:  RRR,  PMI not displaced or sustained,S1 and S2 within normal limits, no S3, no S4: no clicks, no rubs, no murmurs ABD:  Soft, nontender. BS +, no masses or bruits. thin EXT:  2 + pulses throughout, no edema, no cyanosis no clubbing SKIN:  Warm and dry.  No rashes NEURO:  Alert and oriented x 3. Cranial nerves II through XII intact. PSYCH:  Cognitively intact     Labs:   Lab Results  Component Value Date   WBC 8.9 03/05/2016   HGB 13.0 03/05/2016   HCT 37.8 (L) 03/05/2016   MCV 87.7 03/05/2016   PLT 217 03/05/2016    Recent Labs Lab 03/05/16 0739  NA 140  K 3.7  CL 104  CO2 25  BUN 13  CREATININE 1.20  CALCIUM 8.4*  PROT 6.2*  BILITOT 0.7  ALKPHOS 67  ALT 21  AST 29  GLUCOSE 126*   Lab Results  Component Value Date   CKTOTAL 1,672 (H) 03/06/2008   CKTOTAL 1,785 (H) 03/05/2008   CKTOTAL 1,792 (H) 03/04/2008   CKMB 8.9 (H) 03/04/2008   CKMB 11.1 (H) 03/04/2008   CKMB 12.0 (H) 03/03/2008   TROPONINI 0.02        NO INDICATION OF MYOCARDIAL INJURY. 03/04/2008   TROPONINI 0.03        NO INDICATION OF MYOCARDIAL INJURY. 03/04/2008   TROPONINI 0.04        NO INDICATION OF MYOCARDIAL INJURY. 03/03/2008   Lab Results  Component Value Date   CHOL  11/22/2008    116        ATP III CLASSIFICATION:  <200     mg/dL   Desirable  200-239  mg/dL   Borderline High  >=240    mg/dL   High          CHOL  03/04/2008    147        ATP III CLASSIFICATION:  <200     mg/dL   Desirable  200-239  mg/dL   Borderline High  >=240    mg/dL   High   Lab Results  Component Value Date   HDL 34 (L) 11/22/2008   HDL 31 (L) 03/04/2008   Lab Results  Component Value Date   LDLCALC  11/22/2008    60        Total Cholesterol/HDL:CHD Risk Coronary Heart Disease Risk Table                     Men   Women  1/2 Average Risk   3.4   3.3  Average Risk       5.0  4.4  2 X Average  Risk   9.6   7.1  3 X Average Risk  23.4   11.0        Use the calculated Patient Ratio above and the CHD Risk Table to determine the patient's CHD Risk.        ATP III CLASSIFICATION (LDL):  <100     mg/dL   Optimal  100-129  mg/dL   Near or Above                    Optimal  130-159  mg/dL   Borderline  160-189  mg/dL   High  >190     mg/dL   Very High   LDLCALC  03/04/2008    88        Total Cholesterol/HDL:CHD Risk Coronary Heart Disease Risk Table                     Men   Women  1/2 Average Risk   3.4   3.3   Lab Results  Component Value Date   TRIG 108 11/22/2008   TRIG 138 03/04/2008   Lab Results  Component Value Date   CHOLHDL 3.4 11/22/2008   CHOLHDL 4.7 03/04/2008   No results found for: LDLDIRECT  Lab Results  Component Value Date   PROBNP 385.0 (H) 03/05/2012    Lab Results  Component Value Date   HGBA1C 5.6 03/13/2011    Radiology: Dg Chest Port 1 View  Result Date: 03/05/2016 CLINICAL DATA:  Shortness of breath, no chest pain EXAM: PORTABLE CHEST 1 VIEW COMPARISON:  Chest x-ray 02/29/2016 FINDINGS: The heart size and mediastinal contours are within normal limits. Both lungs are clear. The visualized skeletal structures are unremarkable. IMPRESSION: No active disease. Electronically Signed   By: Kathreen Devoid   On: 03/05/2016 08:23    EKG: today sinus tachy with PACs. Right axis.   Telemetry: sinus tachy with frequent PACs.   ASSESSMENT AND PLAN:  1. Appropriate sinus tachycardia. Baseline elevated HR and now faster with adrenergic stimulation of beta agonists, adderall, dehydration. HR is appropriate for his condition. 2. PACs 3. Acute asthmatic attack 4. Adenocarcinoma of pancreas/ liver mets.  Plan: no further cardiac work up needed. Would not treat tachycardia. Consider stopping HCTZ with renal insufficiency and dehydration. Doubt PE with negative CT chest on 02/29/16. Please call with questions.   Signed: Nathaniel Wakeley [place],  [place]  03/05/2016, 6:28 PM

## 2016-03-05 NOTE — ED Triage Notes (Signed)
The patient said he woke out of his sleep with SOB.  The patient denies any pain.  He had asthma as a child.  He has not taken anything.  He does have a history of smoking.  He denies any coughing or discomfort in his chest.

## 2016-03-06 DIAGNOSIS — R0603 Acute respiratory distress: Secondary | ICD-10-CM | POA: Diagnosis not present

## 2016-03-06 LAB — BASIC METABOLIC PANEL
Anion gap: 7 (ref 5–15)
BUN: 16 mg/dL (ref 6–20)
CALCIUM: 8.5 mg/dL — AB (ref 8.9–10.3)
CHLORIDE: 108 mmol/L (ref 101–111)
CO2: 24 mmol/L (ref 22–32)
Creatinine, Ser: 1.35 mg/dL — ABNORMAL HIGH (ref 0.61–1.24)
GFR calc non Af Amer: 50 mL/min — ABNORMAL LOW (ref >60)
GFR, EST AFRICAN AMERICAN: 58 mL/min — AB (ref >60)
GLUCOSE: 297 mg/dL — AB (ref 65–99)
POTASSIUM: 4.2 mmol/L (ref 3.5–5.1)
SODIUM: 139 mmol/L (ref 135–145)

## 2016-03-06 LAB — CBC
HEMATOCRIT: 34.2 % — AB (ref 39.0–52.0)
HEMOGLOBIN: 11.4 g/dL — AB (ref 13.0–17.0)
MCH: 28.9 pg (ref 26.0–34.0)
MCHC: 33.3 g/dL (ref 30.0–36.0)
MCV: 86.8 fL (ref 78.0–100.0)
Platelets: 187 10*3/uL (ref 150–400)
RBC: 3.94 MIL/uL — ABNORMAL LOW (ref 4.22–5.81)
RDW: 12.8 % (ref 11.5–15.5)
WBC: 11.9 10*3/uL — ABNORMAL HIGH (ref 4.0–10.5)

## 2016-03-06 MED ORDER — PREDNISONE 20 MG PO TABS
40.0000 mg | ORAL_TABLET | Freq: Every day | ORAL | Status: DC
  Administered 2016-03-07: 40 mg via ORAL
  Filled 2016-03-06: qty 2

## 2016-03-06 MED ORDER — ALBUTEROL SULFATE (2.5 MG/3ML) 0.083% IN NEBU
2.5000 mg | INHALATION_SOLUTION | Freq: Two times a day (BID) | RESPIRATORY_TRACT | Status: DC
  Administered 2016-03-06: 2.5 mg via RESPIRATORY_TRACT
  Filled 2016-03-06 (×2): qty 3

## 2016-03-06 MED ORDER — ALBUTEROL SULFATE (2.5 MG/3ML) 0.083% IN NEBU
2.5000 mg | INHALATION_SOLUTION | Freq: Three times a day (TID) | RESPIRATORY_TRACT | Status: DC
  Administered 2016-03-06: 2.5 mg via RESPIRATORY_TRACT
  Filled 2016-03-06: qty 3

## 2016-03-06 NOTE — Progress Notes (Signed)
[facility] Progress Note  Patient: Reginald Ochoa [id number]   PCP: Elyn Peers, MD DOB: [birth year]   DOA: 03/05/2016   DOS: 03/06/2016   Date of Service: the patient was seen and examined on 03/06/2016  Brief hospital course: Pt. with PMH of COPD, diabetes; admitted on 03/05/2016, with complaint of shortness of breath, was found to have mild COPD flareup. Currently further plan is continue current management.  Assessment and Plan: #1. Acute respiratory distress likely related to asthma exacerbation in setting of abnormal EKG. Patient continues to smoke. He reports having run out of his home inhaler. Unsure of any pulmonary function tests in the past. Not on oxygen at home. Increased oxygen demand in the emergency department. He is provided with nebulizers and Solu-Medrol emergency department and is much improved on admission -Admit to telemetry -Scheduled nebulizers every 4 hours Change Solu-Medrol 40 mg every 6, to prednisone -Continue Symbicort -Continue oxygen supplementation as indicated -Monitor oxygen saturation level -Wean oxygen as able -Likely benefit from outpatient pulmonary function tests  #2. Tachycardia/abnormal EKG. EKG with HB. No chest pain. Troponin negative. Heart rate drifts to high end of normal when patient sleeping. May be related to nebulizers and/or HB. -Repeat EKG in the a.m. -cycle troponin -cardiology consult appreciated  #3. Hypertension. Poor control in the emergency department. Home medications include Diovan HCT he does not think he stay can his meds this morning. -Resume home meds -Monitor closely  4. Pancreatic mass. Incidental finding on last hospitalization. Concerning for malignancy with liver metastases. During that hospitalization GI consulted and IR biopsy done. CEA elevated as well -Follow-up with [facility] arranged last hospitalization -has follow up scheduled with oncology 10/27  #5. Tobacco use. -Cessation counseling  offered  #.6 Depression and anxiety. Appears stable at baseline. Home medications include Xanax and Prozac - continue meds  Pain management: When necessary Tylenol Activity: Consulted physical therapy Bowel regimen: last BM prior to admission Diet: Cardiac diet DVT Prophylaxis: subcutaneous Heparin  Advance goals of care discussion: Full code  Family Communication: family was present at bedside, at the time of interview. The pt provided permission to discuss medical plan with the family. Opportunity was given to ask question and all questions were answered satisfactorily.   Disposition:  Discharge to home. Expected discharge date: 03/07/2016,   Consultants: Cardiology Procedures: None  Antibiotics: Anti-infectives    Start     Dose/Rate Route Frequency Ordered Stop   03/05/16 1100  levofloxacin (LEVAQUIN) tablet 500 mg  Status:  Discontinued     500 mg Oral Daily 03/05/16 1049 03/05/16 1049        Subjective: Feeling better, complains about fatigue and shortness of breath. No chest pain no dizziness no lightheadedness.  Objective: Physical Exam: Vitals:   03/06/16 0158 03/06/16 0544 03/06/16 0729 03/06/16 1425  BP:  128/74  135/71  Pulse:  79  100  Resp:  18  16  Temp:  98.3 F (36.8 C)  99.7 F (37.6 C)  TempSrc:  Oral  Oral  SpO2: 98% 96% 96% 99%  Weight:      Height:        Intake/Output Summary (Last 24 hours) at 03/06/16 1953 Last data filed at 03/06/16 1425  Gross per 24 hour  Intake              343 ml  Output              875 ml  Net             -  532 ml   Filed Weights   03/05/16 1131  Weight: 53.8 kg (118 lb 11.2 oz)    General: Alert, Awake and Oriented to Time, Place and Person. Appear in mild distress, affect appropriate Eyes: PERRL, Conjunctiva normal ENT: Oral Mucosa clear moist. Neck: no JVD, no Abnormal Mass Or lumps Cardiovascular: S1 and S2 Present, no Murmur, Respiratory: Bilateral Air entry equal and Decreased, no Crackles,  bilateral wheezes Abdomen: Bowel Sound present, Soft and no tenderness Skin: no redness, no Rash, no induration Extremities: no Pedal edema, non calf tenderness Neurologic: Grossly no focal neuro deficit. Bilaterally Equal motor strength  Data Reviewed: CBC:  Recent Labs Lab 02/29/16 0454 03/05/16 0739 03/06/16 0521  WBC 8.0 8.9 11.9*  HGB 12.0* 13.0 11.4*  HCT 35.3* 37.8* 34.2*  MCV 88.0 87.7 86.8  PLT 179 217 [id number]   Basic Metabolic Panel:  Recent Labs Lab 02/29/16 0454 03/05/16 0739 03/06/16 0521  NA 139 140 139  K 3.8 3.7 4.2  CL 106 104 108  CO2 27 25 24   GLUCOSE 114* 126* 297*  BUN 10 13 16   CREATININE 1.12 1.20 1.35*  CALCIUM 8.4* 8.4* 8.5*    Liver Function Tests:  Recent Labs Lab 02/29/16 0454 03/05/16 0739  AST 23 29  ALT 17 21  ALKPHOS 67 67  BILITOT 0.8 0.7  PROT 5.6* 6.2*  ALBUMIN 3.2* 3.6    Recent Labs Lab 03/05/16 0739  LIPASE 27   No results for input(s): AMMONIA in the last 168 hours. Coagulation Profile:  Recent Labs Lab 02/29/16 0454  INR 1.12   Cardiac Enzymes: No results for input(s): CKTOTAL, CKMB, CKMBINDEX, TROPONINI in the last 168 hours. BNP (last 3 results) No results for input(s): PROBNP in the last 8760 hours.  CBG:  Recent Labs Lab 02/29/16 0845 03/01/16 0644 03/01/16 0811 03/01/16 1207 03/01/16 1713  GLUCAP 99 81 90 108* 142*    Studies: No results found.   Scheduled Meds: . albuterol  2.5 mg Nebulization BID  . amphetamine-dextroamphetamine  5-10 mg Oral BID  . enoxaparin (LOVENOX) injection  40 mg Subcutaneous Q24H  . FLUoxetine  40 mg Oral Q breakfast  . guaiFENesin  600 mg Oral BID  . loratadine  10 mg Oral Daily  . mometasone-formoterol  2 puff Inhalation BID  . sodium chloride flush  3 mL Intravenous Q12H   Continuous Infusions:  PRN Meds: acetaminophen **OR** acetaminophen, albuterol, ALPRAZolam, aspirin, HYDROcodone-acetaminophen, ondansetron **OR** ondansetron (ZOFRAN) IV,  senna-docusate  Time spent: 30 minutes  Author: Berle Mull, MD [facility] Pager: [phone] 03/06/2016 7:53 PM  If 7PM-7AM, please contact night-coverage at [email or web address], password [facility]

## 2016-03-06 NOTE — Care Management Obs Status (Signed)
[place] NOTIFICATION   Patient Details  Name: Reginald Ochoa MRN: [id number] Date of Birth: 1941-01-07   Medicare Observation Status Notification Given:  Yes    Sharin Mons, RN 03/06/2016, 11:18 AM

## 2016-03-06 NOTE — Progress Notes (Signed)
Inpatient Diabetes Program Recommendations  AACE/ADA: New Consensus Statement on Inpatient Glycemic Control (2015)  Target Ranges:  Prepandial:   less than 140 mg/dL      Peak postprandial:   less than 180 mg/dL (1-2 hours)      Critically ill patients:  140 - 180 mg/dL   Review of Glycemic Control Results for Reginald Ochoa, Reginald Ochoa (MRN [id number]) as of 03/06/2016 12:52  Ref. Range 03/06/2016 05:21  Glucose Latest Ref Range: 65 - 99 mg/dL 297 (H)   Diabetes history: No prior hx  Inpatient Diabetes Program Recommendations:  Please consider Glycemic control order set with Novolog correction 0-9 units tid with 0-5 units hs and A1c to determine prehospital glycemic control.  Thank you, [name]. Chong Wojdyla, RN, MSN, CDE Inpatient Glycemic Control Team Team Pager [phone] (8am-5pm) 03/06/2016 1:00 PM

## 2016-03-07 DIAGNOSIS — C787 Secondary malignant neoplasm of liver and intrahepatic bile duct: Principal | ICD-10-CM

## 2016-03-07 DIAGNOSIS — R0603 Acute respiratory distress: Secondary | ICD-10-CM | POA: Diagnosis not present

## 2016-03-07 DIAGNOSIS — C259 Malignant neoplasm of pancreas, unspecified: Secondary | ICD-10-CM | POA: Insufficient documentation

## 2016-03-07 LAB — BASIC METABOLIC PANEL
Anion gap: 7 (ref 5–15)
BUN: 19 mg/dL (ref 6–20)
CHLORIDE: 108 mmol/L (ref 101–111)
CO2: 26 mmol/L (ref 22–32)
CREATININE: 1.31 mg/dL — AB (ref 0.61–1.24)
Calcium: 8.4 mg/dL — ABNORMAL LOW (ref 8.9–10.3)
GFR calc Af Amer: >60 mL/min (ref >60)
GFR calc non Af Amer: 52 mL/min — ABNORMAL LOW (ref >60)
Glucose, Bld: 148 mg/dL — ABNORMAL HIGH (ref 65–99)
Potassium: 4 mmol/L (ref 3.5–5.1)
SODIUM: 141 mmol/L (ref 135–145)

## 2016-03-07 LAB — MAGNESIUM: MAGNESIUM: 1.8 mg/dL (ref 1.7–2.4)

## 2016-03-07 MED ORDER — GUAIFENESIN ER 600 MG PO TB12: 600.0000 mg | ORAL_TABLET | Freq: Two times a day (BID) | ORAL | 0 refills | Status: DC

## 2016-03-07 MED ORDER — PREDNISONE 10 MG PO TABS: ORAL_TABLET | ORAL | 0 refills | Status: DC

## 2016-03-07 MED ORDER — AMLODIPINE BESYLATE 2.5 MG PO TABS: 2.5000 mg | ORAL_TABLET | Freq: Every day | ORAL | 0 refills | Status: DC

## 2016-03-07 MED ORDER — AZITHROMYCIN 500 MG PO TABS: 500.0000 mg | ORAL_TABLET | Freq: Every day | ORAL | 0 refills | Status: AC

## 2016-03-07 MED ORDER — SENNOSIDES-DOCUSATE SODIUM 8.6-50 MG PO TABS: 1.0000 | ORAL_TABLET | Freq: Every evening | ORAL | 0 refills | Status: AC | PRN

## 2016-03-07 MED ORDER — AMLODIPINE BESYLATE 2.5 MG PO TABS
2.5000 mg | ORAL_TABLET | Freq: Every day | ORAL | Status: DC
  Administered 2016-03-07: 2.5 mg via ORAL
  Filled 2016-03-07: qty 1

## 2016-03-07 NOTE — Evaluation (Signed)
Physical Therapy Evaluation Patient Details Name: Reginald Ochoa MRN: [id number] DOB: [birth year] Today's Date: 03/07/2016   History of Present Illness  Pt adm with COPD exacerbation. PMH - recent finding of pancreatic mass with outpt consult on 10/27, HTN, anxiety/depression, asthma, alcohol abuse  Clinical Impression Pt admitted with above diagnosis and presents to PT with functional limitations due to deficits listed below (See PT problem list). Recommend pt return home with family and initiate HHPT for balance training.    Follow Up Recommendations Home health PT;Supervision - Intermittent    Equipment Recommendations  None recommended by PT    Recommendations for Other Services       Precautions / Restrictions Precautions Precautions: Fall Restrictions Weight Bearing Restrictions: No      Mobility  Bed Mobility Overal bed mobility: Modified Independent Bed Mobility: Supine to Sit           General bed mobility comments: Incr time  Transfers Overall transfer level: Modified independent Equipment used: None Transfers: Sit to/from Stand Sit to Stand: Modified independent (Device/Increase time)            Ambulation/Gait Ambulation/Gait assistance: Modified independent (Device/Increase time);Supervision Ambulation Distance (Feet): 300 Feet Assistive device: None Gait Pattern/deviations: Step-through pattern;Decreased stride length;Drifts right/left Gait velocity: decr Gait velocity interpretation: Below normal speed for age/gender General Gait Details: In smaller environment of room pt at modified independent level. In open environment with multiple obstacles and unpredictable movements like hallway pt supervision. Slightly unsteady but no overt loss of balance. SpO2 96% with amb on RA  Stairs            Wheelchair Mobility    Modified Rankin (Stroke Patients Only)       Balance Overall balance assessment: Needs assistance Sitting-balance  support: No upper extremity supported;Feet supported Sitting balance-Leahy Scale: Normal     Standing balance support: No upper extremity supported;During functional activity Standing balance-Leahy Scale: Good                               Pertinent Vitals/Pain Pain Assessment: No/denies pain    Home Living Family/patient expects to be discharged to:: Private residence Living Arrangements: Children;Spouse/significant other Available Help at Discharge: Family;Available PRN/intermittently Type of Home: House Home Access: Level entry     Home Layout: Two level;Able to live on main level with bedroom/bathroom Home Equipment: Other (comment) (walking stick for outside)      Prior Function Level of Independence: Independent with assistive device(s)         Comments: Uses walking stick as needed. Per step son, pt not supposed to leave the house without telling someone but does. Home alone during the day. 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     Hand Dominance   Dominant Hand: Right    Extremity/Trunk Assessment   Upper Extremity Assessment: Overall WFL for tasks assessed           Lower Extremity Assessment: Generalized weakness         Communication   Communication: No difficulties  Cognition Arousal/Alertness: Awake/alert Behavior During Therapy: WFL for tasks assessed/performed Overall Cognitive Status: Within Functional Limits for tasks assessed                      General Comments      Exercises     Assessment/Plan    PT Assessment All further PT needs can be met in the next  venue of care  PT Problem List Decreased strength;Decreased balance;Decreased mobility          PT Treatment Interventions      PT Goals (Current goals can be found in the Care Plan section)  Acute Rehab PT Goals Patient Stated Goal: go home    Frequency     Barriers to discharge Decreased caregiver support alone during the day    Co-evaluation                End of Session Equipment Utilized During Treatment: Gait belt Activity Tolerance: Patient tolerated treatment well Patient left: in bed;with call bell/phone within reach;with bed alarm set;with family/visitor present Nurse Communication: Mobility status    Functional Assessment Tool Used: clinical observation Functional Limitation: Mobility: Walking and moving around Mobility: Walking and Moving Around Current Status (T2671): At least 1 percent but less than 20 percent impaired, limited or restricted Mobility: Walking and Moving Around Goal Status [phone]): At least 1 percent but less than 20 percent impaired, limited or restricted Mobility: Walking and Moving Around Discharge Status [phone]): At least 1 percent but less than 20 percent impaired, limited or restricted    Time: 1241-1253 PT Time Calculation (min) (ACUTE ONLY): 12 min   Charges:   PT Evaluation $PT Eval Low Complexity: 1 Procedure     PT G Codes:   PT G-Codes **NOT FOR INPATIENT CLASS** Functional Assessment Tool Used: clinical observation Functional Limitation: Mobility: Walking and moving around Mobility: Walking and Moving Around Current Status (A2505): At least 1 percent but less than 20 percent impaired, limited or restricted Mobility: Walking and Moving Around Goal Status [phone]): At least 1 percent but less than 20 percent impaired, limited or restricted Mobility: Walking and Moving Around Discharge Status [phone]): At least 1 percent but less than 20 percent impaired, limited or restricted    [facility] 03/07/2016, 1:44 PM [facility] PT [phone]

## 2016-03-07 NOTE — Progress Notes (Signed)
[place]  Telephone:([phone]) [phone] Fax:(336) [phone]  Clinic New Consult Note   Patient Care Team: Lucianne Lei, MD as PCP - General (Family Medicine) 03/08/2016  REFERRAL PHYSICIAN: [facility] Dr. Broadus John  CHIEF COMPLAINTS/PURPOSE OF CONSULTATION:  Newly diagnosed metastatic pancreatic adenocarcinoma  Oncology History   Pancreatic cancer metastasized to liver [facility])   Staging form: Pancreas, AJCC 7th Edition   - Clinical stage from 03/01/2016: Stage IV (T2, N1, M1) - Signed by Truitt Merle, MD on 03/07/2016      Pancreatic cancer metastasized to liver [facility])   02/28/2016 - 03/01/2016 Hospital Admission    Pt presented with syncope and diarrhea. c-diff (-), he underwent liver biopsy and CT scans, and was discharged home.       03/01/2016 Initial Diagnosis    Pancreatic cancer metastasized to liver ([place])      03/01/2016 Initial Biopsy    Liver biopsy showed adenocarcinoma, features are consistent with metastatic adenocarcinoma      03/01/2016 Imaging    CT chest, abdomen and pelvis with contrast showed hyperenhancing mass in the head and uncinate process of the pancreas, with small nodular density anterior to the mass is concerning for cellulitis lesions, multiple hepatic hypodensities lesions most compatible with metastatic disease.      03/01/2016 Tumor Marker    CEA 10.5, CA19.9 2         HISTORY OF PRESENTING ILLNESS:  Reginald Ochoa 75 y.o. male with past medical history of hypertension, asthma, depression, anxiety, and alcohol abuse, is here because of His recent diagnosed metastatic pancreatic adenocarcinoma. He is accompanied by his wife [name] and son [name] to my clinic today.  He has been having mild diarrhea for the past 2-3 weeks, lose stool, 3-5 times a day. 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 He may lost consciousness for a short period time. He was found by EMS which happened to be there, and  was send to ED.He was found to have positive orthostatic vital signs in ED. CT abdomen/pelvis showed possible pancreatic cancerwith possible liver metastasis. He was admitted to the hospital, underwent liver biopsy, and was discharged home the next day.   He has no nausea, no pain, diarrhea improved lately,  2-3 times a day, he has mild fatigue, appetite is good. 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 He is able to do all his ADLs, but not much housework or other activities.   He had three TIA, which affected his vision, he does not see well in the dark. He lives with his wife.  He has been on oxycodone since 1998 for head and neck pain since he had fall and neck injury.  he used to drink alcohol heavily, but has stopped 20 years ago. He has history of heavy smoking, still smokes half pack cigarettes a day.  MEDICAL HISTORY:  Past Medical History:  Diagnosis Date  . Alcohol abuse   . Asthma   . Depression   . Depression with anxiety   . GIB (gastrointestinal bleeding)   . Hypertension   . Pancreatic mass   . Tobacco abuse     SURGICAL HISTORY: Past Surgical History:  Procedure Laterality Date  . bleeding ulcer    . TONSILLECTOMY      SOCIAL HISTORY: Social History   Social History  . Marital status: Married    Spouse name: [name]  . Number of children: 3  .  Years of education: N/A   Occupational History  . Author     Architect   Social History Main Topics  . Smoking status: Current Every Day Smoker    Packs/day: 0.50    Years: 40.00    Types: Cigarettes, Cigars  . Smokeless tobacco: Never Used  . Alcohol use Yes     Comment: used to be a heavy drinker and stopped 20 years ago    . Drug use: No  . Sexual activity: Not on file   Other Topics Concern  . Not on file   Social History Narrative   Married to wife, [name] for 35+ years. 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 Stroke Mother   . Cancer Maternal Uncle     bvrain tumor   . Cancer Cousin     brain tumor     ALLERGIES:  has No Known Allergies.  MEDICATIONS:  Current Outpatient Prescriptions  Medication Sig Dispense Refill  . albuterol (PROVENTIL HFA;VENTOLIN HFA) 108 (90 BASE) MCG/ACT inhaler Inhale 2 puffs into the lungs every 6 (six) hours as needed for wheezing or shortness of breath.    . ALPRAZolam (XANAX) 1 MG tablet Take 0.5-1 mg by mouth 2 (two) times daily as needed for anxiety. Anxiety     . amLODipine (NORVASC) 2.5 MG tablet Take 1 tablet (2.5 mg total) by mouth daily. 30 tablet 0  . amphetamine-dextroamphetamine (ADDERALL) 10 MG tablet Take 5-10 mg by mouth 2 (two) times daily. '10mg'$  in the morning and '5mg'$  at lunch.    Marland Kitchen aspirin 325 MG tablet Take 325 mg by mouth every 6 (six) hours as needed for headache.    Marland Kitchen azithromycin (ZITHROMAX) 500 MG tablet Take 1 tablet (500 mg total) by mouth daily. 3 tablet 0  . budesonide-formoterol (SYMBICORT) 160-4.5 MCG/ACT inhaler Inhale 2 puffs into the lungs 2 (two) times daily.    . cetirizine (ZYRTEC) 10 MG tablet Take 10 mg by mouth at bedtime.    Marland Kitchen FLUoxetine (PROZAC) 40 MG capsule Take 40 mg by mouth daily with breakfast.      . guaiFENesin (MUCINEX) 600 MG 12 hr tablet Take 1 tablet (600 mg total) by mouth 2 (two) times daily. 20 tablet 0  . HYDROcodone-acetaminophen (NORCO) 10-325 MG tablet Take 1 tablet by mouth daily as needed for moderate pain.     Marland Kitchen LUTEIN PO Take 1 tablet by mouth daily.    . Multiple Vitamins-Minerals (MULTIVITAMIN WITH MINERALS) tablet Take 1 tablet by mouth daily.     Marland Kitchen OVER THE COUNTER MEDICATION Place 2 sprays into both nostrils as needed (for congestion). [facility] Nasal Spray for congestion    . predniSONE (DELTASONE) 10 MG  tablet Take '40mg'$  daily for 3days,Take '30mg'$  daily for 3days,Take '20mg'$  daily for 3days,Take '10mg'$  daily for 3days, then stop 30 tablet 0  . senna-docusate (SENOKOT-S) 8.6-50 MG tablet Take 1 tablet by mouth at bedtime as needed for mild constipation. 14 tablet 0  . Pancrelipase, Lip-Prot-Amyl, (CREON) 24000-76000 units CPEP Take 1 capsule (24,000 Units total) by mouth 3 (three) times daily with meals. 90 capsule 1   No current facility-administered medications for this visit.     REVIEW OF SYSTEMS:   Constitutional: Denies  fevers, chills or abnormal night sweats Eyes: Denies blurriness of vision, double vision or watery eyes Ears, nose, mouth, throat, and face: Denies mucositis or sore throat Respiratory: Denies cough, dyspnea or wheezes Cardiovascular: Denies palpitation, chest discomfort or lower extremity swelling Gastrointestinal:  Denies nausea, heartburn or change in bowel habits Skin: Denies abnormal skin rashes Lymphatics: Denies new lymphadenopathy or easy bruising Neurological:Denies numbness, tingling or new weaknesses Behavioral/Psych: Mood is stable, no new changes  All other systems were reviewed with the patient and are negative.  PHYSICAL EXAMINATION: ECOG PERFORMANCE STATUS: 1 - Symptomatic but completely ambulatory  Vitals:   03/08/16 1206  BP: 124/73  Pulse: 79  Resp: 17  Temp: 98.6 F (37 C)   Filed Weights   03/08/16 1206  Weight: 130 lb 11.2 oz (59.3 kg)    GENERAL:alert, no distress and comfortable SKIN: skin color, texture, turgor are normal, no rashes or significant lesions EYES: normal, conjunctiva are pink and non-injected, sclera clear OROPHARYNX:no exudate, no erythema and lips, buccal mucosa, and tongue normal  NECK: supple, thyroid normal size, non-tender, without nodularity LYMPH:  no palpable lymphadenopathy in the cervical, axillary or inguinal LUNGS: clear to auscultation and percussion with normal breathing effort HEART: regular rate &  rhythm and no murmurs and no lower extremity edema ABDOMEN:abdomen soft, non-tender and normal bowel sounds Musculoskeletal:no cyanosis of digits and no clubbing  PSYCH: alert & oriented x 3 with fluent speech NEURO: no focal motor/sensory deficits  LABORATORY DATA:  I have reviewed the data as listed CBC Latest Ref Rng & Units 03/06/2016 03/05/2016 02/29/2016  WBC 4.0 - 10.5 K/uL 11.9(H) 8.9 8.0  Hemoglobin 13.0 - 17.0 g/dL 11.4(L) 13.0 12.0(L)  Hematocrit 39.0 - 52.0 % 34.2(L) 37.8(L) 35.3(L)  Platelets 150 - 400 K/uL 187 217 179   CMP Latest Ref Rng & Units 03/07/2016 03/06/2016 03/05/2016  Glucose 65 - 99 mg/dL 148(H) 297(H) 126(H)  BUN 6 - 20 mg/dL '19 16 13  '$ Creatinine 0.61 - 1.24 mg/dL 1.31(H) 1.35(H) 1.20  Sodium 135 - 145 mmol/L 141 139 140  Potassium 3.5 - 5.1 mmol/L 4.0 4.2 3.7  Chloride 101 - 111 mmol/L 108 108 104  CO2 22 - 32 mmol/L '26 24 25  '$ Calcium 8.9 - 10.3 mg/dL 8.4(L) 8.5(L) 8.4(L)  Total Protein 6.5 - 8.1 g/dL - - 6.2(L)  Total Bilirubin 0.3 - 1.2 mg/dL - - 0.7  Alkaline Phos 38 - 126 U/L - - 67  AST 15 - 41 U/L - - 29  ALT 17 - 63 U/L - - 21   PATHOLOGY REPORT  Diagnosis 03/01/2016 Liver, needle/core biopsy, Right Lobe - ADENOCARCINOMA. Microscopic Comment The features are consistent with metastatic adenocarcinoma. (JDP:kh 03/04/16)   RADIOGRAPHIC STUDIES: I have personally reviewed the radiological images as listed and agreed with the findings in the report. Dg Chest 2 View  Result Date: 02/29/2016 CLINICAL DATA:  Initial valuation for acute cough. EXAM: CHEST  2 VIEW COMPARISON:  None. FINDINGS: Cardiac and mediastinal silhouettes are within normal limits. Lungs normally inflated. No focal trait, pulmonary edema, or pleural effusion. No pneumothorax. No acute osseous abnormality. IMPRESSION: No active cardiopulmonary disease. Electronically Signed   By: Jeannine Boga M.D.   On: 02/29/2016 03:07   Ct Head Wo Contrast  Result Date:  02/28/2016 CLINICAL DATA:  Hit top of head now with small hematoma. EXAM: CT HEAD WITHOUT CONTRAST TECHNIQUE: Contiguous axial images were obtained from the base of the skull through the vertex without intravenous contrast. COMPARISON:  05/09/2014 FINDINGS: Brain: Similar findings of advanced atrophy with sulcal prominence centralized volume loss with commensurate ex vacuo dilatation of the ventricular system. Stable sequela of prior right PCA distribution infarct with associated asymmetric ex vacuo dilatation involving the occipital horn of the right lateral ventricle. Scattered minimal periventricular hypodensities compatible microvascular ischemic disease. Given extensive background parenchymal abnormalities, there is no CT evidence superimposed acute large territory infarct. Punctate (approximately 3 mm) suspected meningioma about the inner table of the right frontal cortex (coronal image 26, series 205). Otherwise, no intraparenchymal or extra-axial mass. No intraparenchymal or extra-axial hemorrhage. Unchanged size and configuration of the ventricles and the basilar cisterns. No midline shift. Vascular: Intracranial atherosclerosis. Skull: No displaced calvarial fracture. Sinuses/Orbits: There is underpneumatization the bilateral frontal sinuses. A tooth is again noted to be impacted within the caudal aspect of the left maxillary sinus. The remaining paranasal sinuses and mastoid air cells are normally aerated. No air-fluid levels. Post left-sided cataract surgery. Other: Unchanged dermal calcification is noted about the right parietal calvarium (image 19, series 205). Regional soft tissues appear normal. No radiopaque foreign body. 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 Electronically Signed   By: Sandi Mariscal M.D.   On: 02/28/2016 17:51   Ct Chest W Contrast  Result Date:  03/01/2016 CLINICAL DATA:  History of pancreatic mass and asthma EXAM: CT CHEST WITH CONTRAST TECHNIQUE: Multidetector CT imaging of the chest was performed during intravenous contrast administration. CONTRAST:  58m ISOVUE-300 IOPAMIDOL (ISOVUE-300) INJECTION 61% COMPARISON:  02/29/2016 FINDINGS: Cardiovascular: Thoracic aorta show some mild atherosclerotic calcifications without aneurysmal dilatation or dissection. Mild coronary calcifications are seen. Pulmonary artery is incompletely evaluated. Cardiac structures show no acute abnormality. Mediastinum/Nodes: The thoracic inlet is within normal limits. No significant hilar or mediastinal adenopathy is noted. No axillary adenopathy is seen. The esophagus as visualized is within normal limits. Lungs/Pleura: Mild emphysematous changes are noted. No focal infiltrate or sizable effusion is seen. No nodular changes are noted. Upper Abdomen: The upper abdomen demonstrates multiple ring-enhancing lesions consistent with hepatic metastatic disease. The known pancreatic head mass is incompletely evaluated on this exam. Musculoskeletal: Degenerative changes of the thoracic spine are noted. No definitive osseous metastatic disease is seen. IMPRESSION: Changes consistent with the known hepatic metastatic disease. No acute intrathoracic abnormality is noted. Mild emphysematous changes. Electronically Signed   By: MInez CatalinaM.D.   On: 03/01/2016 07:30   Ct Abdomen Pelvis W Contrast  Result Date: 02/29/2016 CLINICAL DATA:  75year old male with diarrhea and diffuse abdominal pain. EXAM: CT ABDOMEN AND PELVIS WITH CONTRAST TECHNIQUE: Multidetector CT imaging of the abdomen and pelvis was performed using the standard protocol following bolus administration of intravenous contrast. CONTRAST:  759mISOVUE-300 IOPAMIDOL (ISOVUE-300) INJECTION 61% COMPARISON:  None. FINDINGS: Lower chest: The visualized lung bases are clear. No intra-abdominal free air.  Trace perihepatic  free fluid. Hepatobiliary: There are multiple hepatic hypodense lesions which demonstrate somewhat ill-defined margins. The largest lesion is in the dome of the liver and measures 2.0 x 1.5 cm. These lesions are not well characterized but are concerning for metastatic disease. Further evaluation with MRI without and with contrast is recommended. Mild periportal edema. No biliary ductal dilatation. The gallbladder is mildly distended. No calcified gallstone identified. Pancreas: There is a 3.5 x 2.8 x 2.8 cm hypoenhancing mass in the head and uncinate process of the pancreas. There is associated gland atrophy with mild dilatation of the main  pancreatic duct. Spleen: Choose 1 Adrenals/Urinary Tract: The adrenal glands appear unremarkable. Mild bilateral renal atrophy. Subcentimeter right renal upper pole hypodense lesion is not well characterized but most likely represents a cyst. There is no hydronephrosis on either side. The visualized ureters appear unremarkable. There is thickened and trabecular appearance of the urinary bladder likely related to chronic bladder outlet obstruction. Correlation with urinalysis recommended to exclude UTI. Stomach/Bowel: There is sigmoid diverticulosis with muscular hypertrophy. Underlying colonic mass is not excluded. Follow-up with colonoscopy recommended. Mild haziness of the sigmoid colon may be chronic or represent mild diverticulitis. Scattered colonic diverticula noted. There is no evidence of bowel obstruction. Normal appendix. Vascular/Lymphatic: There is moderate aortoiliac atherosclerotic disease. The abdominal aorta and IVC are otherwise unremarkable. The origins of the celiac axis, SMA, IMA as well as the origins of the renal arteries appear patent. There is no infiltration of the fat surrounding the celiac axis or SMA. The SMV, splenic vein, and main portal vein are patent. No portal venous gas identified. A nodular density measuring 7 mm in short anterior to the  pancreatic mass may represent a slightly enlarged lymph node or a satellite lesion. No other adenopathy identified. A smaller nodular density noted posterior to the pancreatic mass and anterior to the IVC (series 2, image 24). Reproductive: Heterogeneous prostate gland. Other: There is loss of subcutaneous fat predominantly involving the lower chest and cachexia. Musculoskeletal: Degenerative changes of the spine. Disc desiccation with vacuum phenomenon in the lower lumbar spine. No acute fracture. 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 Further evaluation with MRI without and with contrast and MRCP recommended. Small nodular density anterior to the pancreatic mass is concerning for satellite lesions. Multiple hepatic hypodense lesions most compatible with metastatic disease. No evidence of celiac or SMA encasement. Sigmoid diverticulosis with muscular hypertrophy. Mild sigmoid diverticulitis is not excluded. No bowel obstruction. Normal appendix. Follow-up with nonemergent colonoscopy recommended to exclude underlying sigmoid mass. Electronically Signed   By: Elgie Collard M.D.   On: 02/29/2016 00:20   US Biopsy  Result Date: 03/01/2016 CLINICAL DATA:  Mass of head of the pancreas with multiple liver lesions. The patient presents for biopsy of a liver lesion. EXAM: ULTRASOUND GUIDED CORE BIOPSY OF LIVER MEDICATIONS: 1.5 mg IV Versed; 75 mcg IV Fentanyl Total Moderate Sedation Time: 15 minutes. The patient's level of consciousness and physiologic status were continuously monitored during the procedure by Radiology nursing. PROCEDURE: The procedure, risks, benefits, and alternatives were explained to the patient. Questions regarding the procedure were encouraged and answered. The patient understands and consents to the procedure. A time out was performed prior to initiating the  procedure. The right abdominal wall was prepped with chlorhexidine in a sterile fashion, and a sterile drape was applied covering the operative field. A sterile gown and sterile gloves were used for the procedure. Local anesthesia was provided with 1% Lidocaine. Ultrasound was used to localize liver lesions. A lesion in the right lobe of the liver was chosen. Under ultrasound guidance, a 17 gauge needle was advanced to the lesion. A total of 3 separate coaxial 18 gauge core biopsy samples were obtained through the lesion. Material was submitted in formalin. COMPLICATIONS: None. FINDINGS: The best visualized lesion was a roughly 1.7 cm partially necrotic lesion within the posterior right lobe of the liver. Samples were obtained through this lesion. IMPRESSION: Ultrasound-guided core biopsy performed a lesion in the  right lobe of the liver. Electronically Signed   By: Aletta Edouard M.D.   On: 03/01/2016 16:11   Dg Chest Port 1 View  Result Date: 03/05/2016 CLINICAL DATA:  Shortness of breath, no chest pain EXAM: PORTABLE CHEST 1 VIEW COMPARISON:  Chest x-ray 02/29/2016 FINDINGS: The heart size and mediastinal contours are within normal limits. Both lungs are clear. The visualized skeletal structures are unremarkable. IMPRESSION: No active disease. Electronically Signed   By: Kathreen Devoid   On: 03/05/2016 08:23    ASSESSMENT & PLAN: 75 year old Caucasian male, with past medical history of hypertension, depression, anxiety, alcohol and tobacco abuse, presented with diarrhea and fall.  1. Metastatic pancreatic adenocarcinoma to liver -I reviewed his CT scan findings and the biopsy results with patient and his family members in details. -His liver biopsy confirmed metastatic adenocarcinoma, giving the large size of tumor in the pancreatic head, otherwise negative CT scans, I think this is metastatic adenocarcinoma from pancreas.  -We discussed that his cancer is incurable at this stage, and overall  prognosis is very poor. Median survival is about 4-6 months. -We discussed the goal of therapy is palliative, to prolong his life and to preserve his quality of life. -We discussed systemic therapeutic options, including chemotherapy and immunotherapy. We discussed prior chemotherapy regiment and the response rate. Given his limited performance status, advanced age and medical comorbidities, I recommended him to start with single agent gemcitabine. If he tolerates well. Her cycle, I'll add Abraxane. I do not think he is a candidate for more intensive chemotherapy, such as FOLFIRINOX -Alternatively, palliative care alone, was also discussed with patient. He would like to try chemotherapy first. -I'll request his biopsies tissue to be tested for MSI, to see if he is a candidate for immunotherapy. --Chemotherapy consent: Side effects including but does not not limited to, fatigue, nausea, vomiting, diarrhea, hair loss, neuropathy, fluid retention, renal and kidney dysfunction, neutropenic fever, needed for blood transfusion, bleeding, were discussed with patient in great detail. She agrees to proceed. -we plan to start chemo next week -We discussed port placement if he needs to the road.  2. HTN -His amlodipine, but was found to have orthostatic hypotension, probably related to his diarrhea. -I encouraged him to monitor his blood pressure closely at home, and hold chemotherapy if his blood pressure is low or he feels dizzy. -We'll monitor his blood pressure closely during the chemotherapy.  3. Diarrhea -Probably related to pancreatic and insufficiency from hepatic cancer  -I called in Creon to his pharmacy -He knows to use Imodium as needed   4. Anxiety, depression -Continue medication. I encouraged him to be positive  5. History of alcohol abuse, active smoker -I encouraged him to quit smoking, smoking cessation was discussed with patient and his family -He is willing to cut down his  smoking  Recommendations: Based on information available as of today's consult. Recommendations may change depending on the results of further tests or exams. 1) Chemotherapy with Gemzar weekly X 3, then 1 week break (1 cycle)-start 03/15/16 2) Add Abraxane to regimen after 1st cycle if tolerates Gemzar 3) Rescan after 2-3 cycles of chemotherapy 4) Can have IR place port if needed later _______________________________________________________________________ Next Steps:  1) Chemo education class next week 2) Creon capsules to help diarrhea and have Imodium on hand for diarrhea 3) High protein, high carb diet and increase fluid intake of water. No alcohol 4) Try to stay active and involved in activities to promote healthy/positive attitude 5)  You will see Dr. Burr Medico again on 03/22/16 (2nd treatment)   Orders Placed This Encounter  Procedures  . Ambulatory referral to Nutrition and Diabetic Education    Referral Priority:   Routine    Referral Type:   Consultation    Referral Reason:   Specialty Services Required    Number of Visits Requested:   1    All questions were answered. The patient knows to call the clinic with any problems, questions or concerns.  I spent 55 minutes counseling the patient face to face. The total time spent in the appointment was 60 minutes and more than 50% was on counseling.     Truitt Merle, MD 03/10/2016 2:50 PM

## 2016-03-07 NOTE — Progress Notes (Signed)
Pt given discharge instructions, prescriptions, and care notes. Pt verbalized understanding AEB no further questions or concerns at this time. IV was discontinued, no redness, pain, or swelling noted at this time. Telemetry discontinued and Centralized Telemetry was notified. Pt left the floor via wheelchair with staff in stable condition. 

## 2016-03-07 NOTE — Progress Notes (Signed)
SATURATION QUALIFICATIONS: (This note is used to comply with regulatory documentation for home oxygen)  Patient Saturations on Room Air at Rest = 100%  Patient Saturations on Room Air while Ambulating = 96%   Please briefly explain why patient needs home oxygen: N/A 

## 2016-03-08 ENCOUNTER — Encounter: Payer: Self-pay | Admitting: *Deleted

## 2016-03-08 ENCOUNTER — Telehealth: Payer: Self-pay | Admitting: Hematology

## 2016-03-08 ENCOUNTER — Encounter: Payer: Self-pay | Admitting: Hematology

## 2016-03-08 ENCOUNTER — Ambulatory Visit (HOSPITAL_BASED_OUTPATIENT_CLINIC_OR_DEPARTMENT_OTHER): Payer: Medicare Other | Admitting: Hematology

## 2016-03-08 VITALS — BP 124/73 | HR 79 | Temp 98.6°F | Resp 17 | Ht 66.0 in | Wt 130.7 lb

## 2016-03-08 DIAGNOSIS — F329 Major depressive disorder, single episode, unspecified: Secondary | ICD-10-CM | POA: Diagnosis not present

## 2016-03-08 DIAGNOSIS — C787 Secondary malignant neoplasm of liver and intrahepatic bile duct: Secondary | ICD-10-CM

## 2016-03-08 DIAGNOSIS — C250 Malignant neoplasm of head of pancreas: Secondary | ICD-10-CM

## 2016-03-08 DIAGNOSIS — C259 Malignant neoplasm of pancreas, unspecified: Secondary | ICD-10-CM

## 2016-03-08 DIAGNOSIS — R197 Diarrhea, unspecified: Secondary | ICD-10-CM | POA: Diagnosis not present

## 2016-03-08 DIAGNOSIS — F1021 Alcohol dependence, in remission: Secondary | ICD-10-CM

## 2016-03-08 DIAGNOSIS — Z720 Tobacco use: Secondary | ICD-10-CM

## 2016-03-08 DIAGNOSIS — F419 Anxiety disorder, unspecified: Secondary | ICD-10-CM

## 2016-03-08 MED ORDER — PANCRELIPASE (LIP-PROT-AMYL) 24000-76000 UNITS PO CPEP: 1.0000 | ORAL_CAPSULE | Freq: Three times a day (TID) | ORAL | 1 refills | Status: DC

## 2016-03-08 NOTE — Patient Instructions (Signed)
Care Plan Summary- 03/08/2016 Name: Reginald Ochoa       DOB: Jul 11, 1940 Your Medical Team:  Medical Oncologist:  Dr. 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   Recommendations: Based on information available as of today's consult. Recommendations may change depending on the results of further tests or exams. 1) Chemotherapy with Gemzar weekly X 3, then 1 week break (1 cycle)-start 03/15/16 2) Add Abraxane to regimen after 1st cycle if tolerates Gemzar 3) Rescan after 2-3 cycles of chemotherapy 4) Can have IR place port if needed later _______________________________________________________________________ Next Steps:  1) Chemo education class next week 2) Creon capsules to help diarrhea and have Imodium on hand for diarrhea 3) High protein, high carb diet and increase fluid intake of water. No alcohol 4) Try to stay active and involved in activities to promote healthy/positive attitude 5) You will see Dr. Burr Medico again on 03/22/16 (2nd treatment) Questions? Merceda Elks, RN, BSN at [phone]. Manuela Schwartz is your Oncology Nurse Navigator and is available to assist you while you're receiving your medical care at [facility].  The Gemzar alone has approximately 10% response rate. If tolerated, will add the Abraxane. Regardless the chemotherapy has palliative intent Will arrange for you to see the dietician as well to reinforce good eating habits Dr. Burr Medico will have your biopsy tissue tested for MSI (microsatellite instability) to determine if you will be candidate for immunotherapy in future.

## 2016-03-08 NOTE — Progress Notes (Signed)
Oncology Nurse Navigator Documentation  Oncology Nurse Navigator Flowsheets 03/08/2016  Navigator Location [facility]  Referral date to RadOnc/MedOnc -  Navigator Encounter Type Initial MedOnc  Abnormal Finding Date -  Confirmed Diagnosis Date 03/01/2016  Patient Visit Type MedOnc;Inpatient  Treatment Phase Pre-Tx/Tx Discussion  Barriers/Navigation Needs Education  Education Newly Diagnosed Cancer Education;Understanding Cancer/ Treatment Options;Symptom Management;Preparing for Upcoming Treatment  Interventions Education;Referrals  Referrals Nutrition/dietician  Education Method Verbal;Written;Teach-back  Support Groups/Services GI Support Group;[place];Other--[facility] support services  Acuity Level 2  Time Spent with Patient 77  Met with patient, wife, Reginald Ochoa and son during new patient visit. Explained the role of the GI Nurse Navigator and provided New Patient Packet with information on: 1. Pancreas cancer--info on anatomy of pancreas, tumor marker tests, fact sheets on gemzar and abraxane 2. Support groups 3. Advanced Directives 4. Fall Safety Plan Answered questions, reviewed current treatment plan using TEACH back and provided emotional support. Provided copy of current treatment plan. Escorted patient to scheduling afterwards. Reginald Ochoa has been married to Reginald Ochoa for about 35 years now. They live in [place] in [place]. They each have a son and also one son together. One of the sons has had testicular cancer and came to [facility] in past. He is a Architect of novels and writes reviews. He spends most of his day at the computer. Says he is not an active person. He is alone a portion of the day, but has agreed not to leave the home unaccompanied since he has had recent fall and he has very poor vision and balance. Ambulates in home with walker and cane. 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 Still smokes mini-cigars ([employer] brand). Has good appetite, having some loose stools-MD started him on Creon. Navigator instructed him to call if these prove too expensive for him.   Reginald Elks, RN, BSN GI Oncology [place]

## 2016-03-08 NOTE — Telephone Encounter (Signed)
Message sent chemo scheduler to add chemo, per 03/08/16 los. Chemo Education class, Lab and follow up appointment scheduled, per 03/08/16 los. AVS report and appointment schedule, given to patient,per 03/08/16 los.

## 2016-03-10 ENCOUNTER — Encounter: Payer: Self-pay | Admitting: Hematology

## 2016-03-10 MED ORDER — ONDANSETRON HCL 8 MG PO TABS: 8.0000 mg | ORAL_TABLET | Freq: Two times a day (BID) | ORAL | 1 refills | Status: DC | PRN

## 2016-03-10 MED ORDER — PROCHLORPERAZINE MALEATE 10 MG PO TABS: 10.0000 mg | ORAL_TABLET | Freq: Four times a day (QID) | ORAL | 1 refills | Status: DC | PRN

## 2016-03-10 NOTE — Progress Notes (Signed)
START ON PATHWAY REGIMEN - Pancreatic  PANOS51: Nab-Paclitaxel (Abraxane) 125 mg/m2 D1, 8, 15 + Gemcitabine 1,000 mg/m2 D1, 8, 15 q28 Days Until Progression or Toxicity   A cycle is every 28 days:     Nab-Paclitaxel (protein bound) (Abraxane(R)) 125 mg/m2 in NS to a concentration of 5 mg/mL given IV over 30 mins days 1, 8, and 15 followed by Dose Mod: None     Gemcitabine (Gemzar(R)) 1000 mg/m2 in 250 mL NS IV over 30 minutes days 1, 8, and 15. Dose Mod: None Additional Orders: Hold therapy and notify physician if [place] < 1500.  **Always confirm dose/schedule in your pharmacy ordering system**    Patient Characteristics: Adenocarcinoma, Metastatic Disease, First Line, PS >= 2 Current evidence of distant metastases? Yes AJCC T Stage: 2 AJCC N Stage: 0 AJCC Stage Grouping: IV AJCC M Stage: 1 Histology: Adenocarcinoma Line of therapy: First Line Would you be surprised if this patient died  in the next year? I would NOT be surprised if this patient died in the next year  Intent of Therapy: Non-Curative / Palliative Intent, Discussed with Patient

## 2016-03-11 ENCOUNTER — Telehealth: Payer: Self-pay | Admitting: *Deleted

## 2016-03-11 NOTE — Discharge Summary (Signed)
[facility] Discharge Summary   Patient: Reginald Ochoa [id number]   PCP: Elyn Peers, MD DOB: [birth year]   Date of admission: 03/05/2016   Date of discharge: 03/07/2016     Discharge Diagnoses:  Principal Problem:   Acute respiratory distress Active Problems:   Pancreatic mass   Hypertension   Asthma exacerbation   Tobacco abuse   Depression with anxiety   Alcohol abuse   Tachycardia   Abnormal EKG   Admitted From: Home Disposition:  Home with home health  Recommendations for Outpatient Follow-up:  1. Follow-up with PCP in one week   Follow-up Information    Elyn Peers, MD Follow up on 03/13/2016.   Specialty:  Family Medicine Why:  Appointment is on 03/13/16 at 11:45pm Contact information: [address] [place] [address] [phone]        [place] .   Why:  home health PT arranged Contact information: [address] [place] [address] [phone]          Diet recommendation: Cardiac diet  Activity: The patient is advised to gradually reintroduce usual activities.  Discharge Condition: good  Code Status: Full code  History of present illness: As per the H and P dictated on admission, " Reginald Ochoa is a 74 y.o. male with medical history significant for  EtOH abuse asthma tobacco use hypertension pression anxiety orthostatic hypotension presents to the emergency department with chief complaint of sudden worsening shortness of breath. Initial evaluation reveals acute respiratory distress likely related to an asthma exacerbation and tachycardia.  Information is obtained from the patient. He states he was doing well since being discharged from home 4 days ago when he awakened this morning with sudden shortness of breath. He denies chest pain palpitations headache dizziness syncope or near-syncope. He denies worsening cough or increased sputum production. He denies fever chills recent travel. He  states he has inhalers at home but he ran out "several days ago". He denies abdominal pain nausea vomiting. He reports eating and drinking his normal amount. He denies dysuria hematuria frequency or urgency. Denies diarrhea constipation melena or bright red blood per rectum. He denies lower extremity edema orthopnea."  Hospital Course:  Summary of his active problems in the hospital is as following. #1. Acute respiratory distress likely related to asthma exacerbation in setting of abnormal EKG. Patient continues to smoke. He reports having run out of his home inhaler. Unsure of any pulmonary function tests in the past. Not on oxygen at home. Increased oxygen demand in the emergency department. He is provided with nebulizers and Solu-Medrol emergency department and is much improved on admission -Likely benefit from outpatient pulmonary function tests as well as pulmonary referral. Continue home inhalers at present   #2. Tachycardia/abnormal EKG. EKG with HB. No chest pain. Troponinnegative. Heart rate drifts to high end of normal whenpatient sleeping. May be related to nebulizers and/or HB. -Repeat EKG in the a.m. -cycle troponin -cardiology consult appreciated  #3. Hypertension.  -Monitor closely Given mild renal insufficiency I would discontinue Diovan HCTZ and start the patient on amlodipine which she has tolerated in the past. Recommended to follow-up with PCP.   4. Pancreatic mass. Incidental finding on last hospitalization. Concerning for malignancy with liver metastases. During that hospitalization GI consulted and IR biopsy done. CEA elevated as well -Follow-up with [facility] arranged last hospitalization -has follow up scheduled with oncology 10/27  #5. Tobacco use. -Cessation counseling offered  #.6Depression and anxiety.  Appears stable at baseline. Home medications include Xanax and Prozac - continuemeds  All other chronic medical condition were stable during the  hospitalization.  Patient was seen by physical therapy, who recommended home health, which was arranged by Education officer, museum and case Freight forwarder. On the day of the discharge the patient's vitals were stable, and no other acute medical condition were reported by patient. the patient was felt safe to be discharge at home with home.  Procedures and Results:  NONE   Consultations:  NONE  DISCHARGE MEDICATION: Discharge Medication List as of 03/07/2016  2:34 PM    START taking these medications   Details  amLODipine (NORVASC) 2.5 MG tablet Take 1 tablet (2.5 mg total) by mouth daily., Starting Fri 03/08/2016, Normal    azithromycin (ZITHROMAX) 500 MG tablet Take 1 tablet (500 mg total) by mouth daily., Starting Thu 03/07/2016, Until Sun 03/10/2016, Normal    guaiFENesin (MUCINEX) 600 MG 12 hr tablet Take 1 tablet (600 mg total) by mouth 2 (two) times daily., Starting Thu 03/07/2016, Normal    predniSONE (DELTASONE) 10 MG tablet Take 40mg  daily for 3days,Take 30mg  daily for 3days,Take 20mg  daily for 3days,Take 10mg  daily for 3days, then stop, Normal    senna-docusate (SENOKOT-S) 8.6-50 MG tablet Take 1 tablet by mouth at bedtime as needed for mild constipation., Starting Thu 03/07/2016, Normal      CONTINUE these medications which have NOT CHANGED   Details  albuterol (PROVENTIL HFA;VENTOLIN HFA) 108 (90 BASE) MCG/ACT inhaler Inhale 2 puffs into the lungs every 6 (six) hours as needed for wheezing or shortness of breath., Historical Med    ALPRAZolam (XANAX) 1 MG tablet Take 0.5-1 mg by mouth 2 (two) times daily as needed for anxiety. Anxiety , Historical Med    amphetamine-dextroamphetamine (ADDERALL) 10 MG tablet Take 5-10 mg by mouth 2 (two) times daily. 10mg  in the morning and 5mg  at lunch., Historical Med    aspirin 325 MG tablet Take 325 mg by mouth every 6 (six) hours as needed for headache., Historical Med    budesonide-formoterol (SYMBICORT) 160-4.5 MCG/ACT inhaler Inhale 2 puffs  into the lungs 2 (two) times daily., Historical Med    cetirizine (ZYRTEC) 10 MG tablet Take 10 mg by mouth at bedtime., Historical Med    FLUoxetine (PROZAC) 40 MG capsule Take 40 mg by mouth daily with breakfast.  , Historical Med    HYDROcodone-acetaminophen (NORCO) 10-325 MG tablet Take 1 tablet by mouth daily as needed for moderate pain. , Starting Tue 02/06/2016, Historical Med    LUTEIN PO Take 1 tablet by mouth daily., Historical Med    Multiple Vitamins-Minerals (MULTIVITAMIN WITH MINERALS) tablet Take 1 tablet by mouth daily. , Historical Med    OVER THE COUNTER MEDICATION Place 2 sprays into both nostrils as needed (for congestion). [facility] Nasal Spray for congestion, Historical Med      STOP taking these medications     valsartan-hydrochlorothiazide (DIOVAN-HCT) 80-12.5 MG tablet        No Known Allergies Discharge Instructions    Diet - low sodium heart healthy    Complete by:  As directed    Discharge instructions    Complete by:  As directed    It is important that you read following instructions as well as go over your medication list with RN to help you understand your care after this hospitalization.  Discharge Instructions: Please follow-up with PCP in one week  Please request your primary care physician to go over all [facility]  Tests and Procedure/Radiological results at the follow up,  Please get all Hospital records sent to your PCP by signing hospital release before you go home.   Do not take more than prescribed Pain, Sleep and Anxiety Medications. You were cared for by a hospitalist during your hospital stay. If you have any questions about your discharge medications or the care you received while you were in the hospital after you are discharged, you can call the unit and ask to speak with the hospitalist on call if the hospitalist that took care of you is not available.  Once you are discharged, your primary care physician will handle any further medical  issues. Please note that NO REFILLS for any discharge medications will be authorized once you are discharged, as it is imperative that you return to your primary care physician (or establish a relationship with a primary care physician if you do not have one) for your aftercare needs so that they can reassess your need for medications and monitor your lab values. You Must read complete instructions/literature along with all the possible adverse reactions/side effects for all the Medicines you take and that have been prescribed to you. Take any new Medicines after you have completely understood and accept all the possible adverse reactions/side effects. Wear Seat belts while driving. If you have smoked or chewed Tobacco in the last 2 yrs please stop smoking and/or stop any Recreational drug use.   Increase activity slowly    Complete by:  As directed      Discharge Exam: Filed Weights   03/05/16 1131  Weight: 53.8 kg (118 lb 11.2 oz)   Vitals:   03/06/16 2221 03/07/16 0550  BP:  (!) 145/82  Pulse:  77  Resp:  16  Temp: 99.9 F (37.7 C) 97.7 F (36.5 C)   General: Appear in mild distress, no Rash; Oral Mucosa moist. Cardiovascular: S1 and S2 Present, no Murmur, no JVD Respiratory: Bilateral Air entry present and Clear to Auscultation, no Crackles, no wheezes Abdomen: Bowel Sound present, Soft and no tenderness Extremities: no Pedal edema, no calf tenderness Neurology: Grossly no focal neuro deficit.  The results of significant diagnostics from this hospitalization (including imaging, microbiology, ancillary and laboratory) are listed below for reference.    Significant Diagnostic Studies: Dg Chest 2 View  Result Date: 02/29/2016 CLINICAL DATA:  Initial valuation for acute cough. EXAM: CHEST  2 VIEW COMPARISON:  None. FINDINGS: Cardiac and mediastinal silhouettes are within normal limits. Lungs normally inflated. No focal trait, pulmonary edema, or pleural effusion. No pneumothorax.  No acute osseous abnormality. IMPRESSION: No active cardiopulmonary disease. Electronically Signed   By: Jeannine Boga M.D.   On: 02/29/2016 03:07   Ct Head Wo Contrast  Result Date: 02/28/2016 CLINICAL DATA:  Hit top of head now with small hematoma. EXAM: CT HEAD WITHOUT CONTRAST TECHNIQUE: Contiguous axial images were obtained from the base of the skull through the vertex without intravenous contrast. COMPARISON:  05/09/2014 FINDINGS: Brain: Similar findings of advanced atrophy with sulcal prominence centralized volume loss with commensurate ex vacuo dilatation of the ventricular system. Stable sequela of prior right PCA distribution infarct with associated asymmetric ex vacuo dilatation involving the occipital horn of the right lateral ventricle. Scattered minimal periventricular hypodensities compatible microvascular ischemic disease. Given extensive background parenchymal abnormalities, there is no CT evidence superimposed acute large territory infarct. Punctate (approximately 3 mm) suspected meningioma about the inner table of the right frontal cortex (coronal image 26, series 205). Otherwise, no intraparenchymal  or extra-axial mass. No intraparenchymal or extra-axial hemorrhage. Unchanged size and configuration of the ventricles and the basilar cisterns. No midline shift. Vascular: Intracranial atherosclerosis. Skull: No displaced calvarial fracture. Sinuses/Orbits: There is underpneumatization the bilateral frontal sinuses. A tooth is again noted to be impacted within the caudal aspect of the left maxillary sinus. The remaining paranasal sinuses and mastoid air cells are normally aerated. No air-fluid levels. Post left-sided cataract surgery. Other: Unchanged dermal calcification is noted about the right parietal calvarium (image 19, series 205). Regional soft tissues appear normal. No radiopaque foreign body. 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 Electronically Signed   By: Sandi Mariscal M.D.   On: 02/28/2016 17:51   Ct Chest W Contrast  Result Date: 03/01/2016 CLINICAL DATA:  History of pancreatic mass and asthma EXAM: CT CHEST WITH CONTRAST TECHNIQUE: Multidetector CT imaging of the chest was performed during intravenous contrast administration. CONTRAST:  43mL ISOVUE-300 IOPAMIDOL (ISOVUE-300) INJECTION 61% COMPARISON:  02/29/2016 FINDINGS: Cardiovascular: Thoracic aorta show some mild atherosclerotic calcifications without aneurysmal dilatation or dissection. Mild coronary calcifications are seen. Pulmonary artery is incompletely evaluated. Cardiac structures show no acute abnormality. Mediastinum/Nodes: The thoracic inlet is within normal limits. No significant hilar or mediastinal adenopathy is noted. No axillary adenopathy is seen. The esophagus as visualized is within normal limits. Lungs/Pleura: Mild emphysematous changes are noted. No focal infiltrate or sizable effusion is seen. No nodular changes are noted. Upper Abdomen: The upper abdomen demonstrates multiple ring-enhancing lesions consistent with hepatic metastatic disease. The known pancreatic head mass is incompletely evaluated on this exam. Musculoskeletal: Degenerative changes of the thoracic spine are noted. No definitive osseous metastatic disease is seen. IMPRESSION: Changes consistent with the known hepatic metastatic disease. No acute intrathoracic abnormality is noted. Mild emphysematous changes. Electronically Signed   By: Inez Catalina M.D.   On: 03/01/2016 07:30   Ct Abdomen Pelvis W Contrast  Result Date: 02/29/2016 CLINICAL DATA:  75 year old male with diarrhea and diffuse abdominal pain. EXAM: CT ABDOMEN AND PELVIS WITH CONTRAST TECHNIQUE: Multidetector CT imaging of the abdomen and pelvis was performed using the standard protocol following bolus administration of intravenous contrast. CONTRAST:   72mL ISOVUE-300 IOPAMIDOL (ISOVUE-300) INJECTION 61% COMPARISON:  None. FINDINGS: Lower chest: The visualized lung bases are clear. No intra-abdominal free air.  Trace perihepatic free fluid. Hepatobiliary: There are multiple hepatic hypodense lesions which demonstrate somewhat ill-defined margins. The largest lesion is in the dome of the liver and measures 2.0 x 1.5 cm. These lesions are not well characterized but are concerning for metastatic disease. Further evaluation with MRI without and with contrast is recommended. Mild periportal edema. No biliary ductal dilatation. The gallbladder is mildly distended. No calcified gallstone identified. Pancreas: There is a 3.5 x 2.8 x 2.8 cm hypoenhancing mass in the head and uncinate process of the pancreas. There is associated gland atrophy with mild dilatation of the main pancreatic duct. Spleen: Choose 1 Adrenals/Urinary Tract: The adrenal glands appear unremarkable. Mild bilateral renal atrophy. Subcentimeter right renal upper pole hypodense lesion is not well characterized but most likely represents a cyst. There is no hydronephrosis on either side. The visualized ureters appear unremarkable. There is thickened and trabecular appearance of the urinary bladder likely related to chronic bladder outlet obstruction. Correlation with urinalysis recommended to exclude UTI. Stomach/Bowel: There is sigmoid diverticulosis with muscular hypertrophy. Underlying colonic mass is not excluded. Follow-up with colonoscopy recommended. Mild haziness of the sigmoid  colon may be chronic or represent mild diverticulitis. Scattered colonic diverticula noted. There is no evidence of bowel obstruction. Normal appendix. Vascular/Lymphatic: There is moderate aortoiliac atherosclerotic disease. The abdominal aorta and IVC are otherwise unremarkable. The origins of the celiac axis, SMA, IMA as well as the origins of the renal arteries appear patent. There is no infiltration of the fat  surrounding the celiac axis or SMA. The SMV, splenic vein, and main portal vein are patent. No portal venous gas identified. A nodular density measuring 7 mm in short anterior to the pancreatic mass may represent a slightly enlarged lymph node or a satellite lesion. No other adenopathy identified. A smaller nodular density noted posterior to the pancreatic mass and anterior to the IVC (series 2, image 24). Reproductive: Heterogeneous prostate gland. Other: There is loss of subcutaneous fat predominantly involving the lower chest and cachexia. Musculoskeletal: Degenerative changes of the spine. Disc desiccation with vacuum phenomenon in the lower lumbar spine. No acute fracture. 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 Further evaluation with MRI without and with contrast and MRCP recommended. Small nodular density anterior to the pancreatic mass is concerning for satellite lesions. Multiple hepatic hypodense lesions most compatible with metastatic disease. No evidence of celiac or SMA encasement. Sigmoid diverticulosis with muscular hypertrophy. Mild sigmoid diverticulitis is not excluded. No bowel obstruction. Normal appendix. Follow-up with nonemergent colonoscopy recommended to exclude underlying sigmoid mass. Electronically Signed   By: Anner Crete M.D.   On: 02/29/2016 00:20   US Biopsy  Result Date: 03/01/2016 CLINICAL DATA:  Mass of head of the pancreas with multiple liver lesions. The patient presents for biopsy of a liver lesion. EXAM: ULTRASOUND GUIDED CORE BIOPSY OF LIVER MEDICATIONS: 1.5 mg IV Versed; 75 mcg IV Fentanyl Total Moderate Sedation Time: 15 minutes. The patient's level of consciousness and physiologic status were continuously monitored during the procedure by Radiology nursing. PROCEDURE: The procedure, risks, benefits, and alternatives were explained  to the patient. Questions regarding the procedure were encouraged and answered. The patient understands and consents to the procedure. A time out was performed prior to initiating the procedure. The right abdominal wall was prepped with chlorhexidine in a sterile fashion, and a sterile drape was applied covering the operative field. A sterile gown and sterile gloves were used for the procedure. Local anesthesia was provided with 1% Lidocaine. Ultrasound was used to localize liver lesions. A lesion in the right lobe of the liver was chosen. Under ultrasound guidance, a 17 gauge needle was advanced to the lesion. A total of 3 separate coaxial 18 gauge core biopsy samples were obtained through the lesion. Material was submitted in formalin. COMPLICATIONS: None. FINDINGS: The best visualized lesion was a roughly 1.7 cm partially necrotic lesion within the posterior right lobe of the liver. Samples were obtained through this lesion. IMPRESSION: Ultrasound-guided core biopsy performed a lesion in the right lobe of the liver. Electronically Signed   By: Aletta Edouard M.D.   On: 03/01/2016 16:11   Dg Chest Port 1 View  Result Date: 03/05/2016 CLINICAL DATA:  Shortness of breath, no chest pain EXAM: PORTABLE CHEST 1 VIEW COMPARISON:  Chest x-ray 02/29/2016 FINDINGS: The heart size and mediastinal contours are within normal limits. Both lungs are clear. The visualized skeletal structures are unremarkable. IMPRESSION: No active disease. Electronically Signed   By: Kathreen Devoid   On: 03/05/2016 08:23    Microbiology: No  results found for this or any previous visit (from the past 240 hour(s)).   Labs: CBC:  Recent Labs Lab 03/05/16 0739 03/06/16 0521  WBC 8.9 11.9*  HGB 13.0 11.4*  HCT 37.8* 34.2*  MCV 87.7 86.8  PLT 217 [id number]   Basic Metabolic Panel:  Recent Labs Lab 03/05/16 0739 03/06/16 0521 03/07/16 0430  NA 140 139 141  K 3.7 4.2 4.0  CL 104 108 108  CO2 25 24 26   GLUCOSE 126* 297* 148*    BUN 13 16 19   CREATININE 1.20 1.35* 1.31*  CALCIUM 8.4* 8.5* 8.4*  MG  --   --  1.8   Liver Function Tests:  Recent Labs Lab 03/05/16 0739  AST 29  ALT 21  ALKPHOS 67  BILITOT 0.7  PROT 6.2*  ALBUMIN 3.6    Recent Labs Lab 03/05/16 0739  LIPASE 27   No results for input(s): AMMONIA in the last 168 hours. Cardiac Enzymes: No results for input(s): CKTOTAL, CKMB, CKMBINDEX, TROPONINI in the last 168 hours. BNP (last 3 results) No results for input(s): BNP in the last 8760 hours. CBG: No results for input(s): GLUCAP in the last 168 hours. Time spent: 30 minutes  Signed:  PATEL, PRANAV  [facility] 03/07/2016  , 8:19 AM

## 2016-03-11 NOTE — Telephone Encounter (Signed)
Per LOS I have scheduled appts and notified the scheduler 

## 2016-03-12 ENCOUNTER — Other Ambulatory Visit: Payer: Self-pay

## 2016-03-12 ENCOUNTER — Encounter: Payer: Self-pay | Admitting: *Deleted

## 2016-03-15 ENCOUNTER — Other Ambulatory Visit: Payer: Self-pay | Admitting: Hematology

## 2016-03-15 ENCOUNTER — Ambulatory Visit (HOSPITAL_BASED_OUTPATIENT_CLINIC_OR_DEPARTMENT_OTHER): Payer: Medicare Other

## 2016-03-15 ENCOUNTER — Other Ambulatory Visit (HOSPITAL_BASED_OUTPATIENT_CLINIC_OR_DEPARTMENT_OTHER): Payer: Medicare Other

## 2016-03-15 VITALS — BP 149/58 | HR 84 | Temp 99.0°F | Resp 18

## 2016-03-15 DIAGNOSIS — C787 Secondary malignant neoplasm of liver and intrahepatic bile duct: Secondary | ICD-10-CM

## 2016-03-15 DIAGNOSIS — Z5111 Encounter for antineoplastic chemotherapy: Secondary | ICD-10-CM

## 2016-03-15 DIAGNOSIS — C259 Malignant neoplasm of pancreas, unspecified: Secondary | ICD-10-CM

## 2016-03-15 LAB — CBC WITH DIFFERENTIAL/PLATELET
BASO%: 0.3 % (ref 0.0–2.0)
BASOS ABS: 0 10*3/uL (ref 0.0–0.1)
EOS%: 1 % (ref 0.0–7.0)
Eosinophils Absolute: 0.2 10*3/uL (ref 0.0–0.5)
HEMATOCRIT: 39.5 % (ref 38.4–49.9)
HEMOGLOBIN: 12.9 g/dL — AB (ref 13.0–17.1)
LYMPH#: 1.3 10*3/uL (ref 0.9–3.3)
LYMPH%: 7.9 % — ABNORMAL LOW (ref 14.0–49.0)
MCH: 29.4 pg (ref 27.2–33.4)
MCHC: 32.7 g/dL (ref 32.0–36.0)
MCV: 90 fL (ref 79.3–98.0)
MONO#: 1 10*3/uL — ABNORMAL HIGH (ref 0.1–0.9)
MONO%: 6.1 % (ref 0.0–14.0)
NEUT%: 84.7 % — ABNORMAL HIGH (ref 39.0–75.0)
NEUTROS ABS: 14 10*3/uL — AB (ref 1.5–6.5)
Platelets: 220 10*3/uL (ref 140–400)
RBC: 4.38 10*6/uL (ref 4.20–5.82)
RDW: 13.7 % (ref 11.0–14.6)
WBC: 16.6 10*3/uL — AB (ref 4.0–10.3)

## 2016-03-15 LAB — COMPREHENSIVE METABOLIC PANEL
ALBUMIN: 3.2 g/dL — AB (ref 3.5–5.0)
ALK PHOS: 101 U/L (ref 40–150)
ALT: 99 U/L — AB (ref 0–55)
AST: 42 U/L — AB (ref 5–34)
Anion Gap: 10 mEq/L (ref 3–11)
BUN: 21.8 mg/dL (ref 7.0–26.0)
CALCIUM: 8.4 mg/dL (ref 8.4–10.4)
CO2: 26 mEq/L (ref 22–29)
CREATININE: 1.1 mg/dL (ref 0.7–1.3)
Chloride: 104 mEq/L (ref 98–109)
EGFR: 66 mL/min/{1.73_m2} — ABNORMAL LOW (ref >90)
GLUCOSE: 198 mg/dL — AB (ref 70–140)
POTASSIUM: 3.5 meq/L (ref 3.5–5.1)
SODIUM: 139 meq/L (ref 136–145)
TOTAL PROTEIN: 5.9 g/dL — AB (ref 6.4–8.3)
Total Bilirubin: 1.69 mg/dL — ABNORMAL HIGH (ref 0.20–1.20)

## 2016-03-15 MED ORDER — SODIUM CHLORIDE 0.9 % IV SOLN
1000.0000 mg/m2 | Freq: Once | INTRAVENOUS | Status: AC
  Administered 2016-03-15: 1672 mg via INTRAVENOUS
  Filled 2016-03-15: qty 43.97

## 2016-03-15 MED ORDER — SODIUM CHLORIDE 0.9 % IV SOLN
Freq: Once | INTRAVENOUS | Status: AC
  Administered 2016-03-15: 11:00:00 via INTRAVENOUS

## 2016-03-15 MED ORDER — PROCHLORPERAZINE MALEATE 10 MG PO TABS
ORAL_TABLET | ORAL | Status: AC
  Filled 2016-03-15: qty 1

## 2016-03-15 MED ORDER — PROCHLORPERAZINE MALEATE 10 MG PO TABS
10.0000 mg | ORAL_TABLET | Freq: Once | ORAL | Status: AC
  Administered 2016-03-15: 10 mg via ORAL

## 2016-03-15 NOTE — Patient Instructions (Signed)
[facility] Discharge Instructions for Patients Receiving Chemotherapy  Today you received the following chemotherapy agents:  Gemzar.  To help prevent nausea and vomiting after your treatment, we encourage you to take your nausea medication as directed.   If you develop nausea and vomiting that is not controlled by your nausea medication, call the clinic.   BELOW ARE SYMPTOMS THAT SHOULD BE REPORTED IMMEDIATELY:  *FEVER GREATER THAN 100.5 F  *CHILLS WITH OR WITHOUT FEVER  NAUSEA AND VOMITING THAT IS NOT CONTROLLED WITH YOUR NAUSEA MEDICATION  *UNUSUAL SHORTNESS OF BREATH  *UNUSUAL BRUISING OR BLEEDING  TENDERNESS IN MOUTH AND THROAT WITH OR WITHOUT PRESENCE OF ULCERS  *URINARY PROBLEMS  *BOWEL PROBLEMS  UNUSUAL RASH Items with * indicate a potential emergency and should be followed up as soon as possible.  Feel free to call the clinic you have any questions or concerns. The clinic phone number is [phone].  Please show the CHEMO ALERT CARD at check-in to the Emergency Department and triage nurse.  Gemcitabine injection What is this medicine? GEMCITABINE (jem SIT a been) is a chemotherapy drug. This medicine is used to treat many types of cancer like breast cancer, lung cancer, pancreatic cancer, and ovarian cancer. This medicine may be used for other purposes; ask your health care provider or pharmacist if you have questions. What should I tell my health care provider before I take this medicine? They need to know if you have any of these conditions: -blood disorders -infection -kidney disease -liver disease -recent or ongoing radiation therapy -an unusual or allergic reaction to gemcitabine, other chemotherapy, other medicines, foods, dyes, or preservatives -pregnant or trying to get pregnant -breast-feeding How should I use this medicine? This drug is given as an infusion into a vein. It is administered in a hospital or clinic by a specially  trained health care professional. Talk to your pediatrician regarding the use of this medicine in children. Special care may be needed. Overdosage: If you think you have taken too much of this medicine contact a poison control center or emergency room at once. NOTE: This medicine is only for you. Do not share this medicine with others. What if I miss a dose? It is important not to miss your dose. Call your doctor or health care professional if you are unable to keep an appointment. What may interact with this medicine? -medicines to increase blood counts like filgrastim, pegfilgrastim, sargramostim -some other chemotherapy drugs like cisplatin -vaccines Talk to your doctor or health care professional before taking any of these medicines: -acetaminophen -aspirin -ibuprofen -ketoprofen -naproxen This list may not describe all possible interactions. Give your health care provider a list of all the medicines, herbs, non-prescription drugs, or dietary supplements you use. Also tell them if you smoke, drink alcohol, or use illegal drugs. Some items may interact with your medicine. What should I watch for while using this medicine? Visit your doctor for checks on your progress. This drug may make you feel generally unwell. This is not uncommon, as chemotherapy can affect healthy cells as well as cancer cells. Report any side effects. Continue your course of treatment even though you feel ill unless your doctor tells you to stop. In some cases, you may be given additional medicines to help with side effects. Follow all directions for their use. Call your doctor or health care professional for advice if you get a fever, chills or sore throat, or other symptoms of a cold or flu. Do not treat   yourself. This drug decreases your body's ability to fight infections. Try to avoid being around people who are sick. This medicine may increase your risk to bruise or bleed. Call your doctor or health care  professional if you notice any unusual bleeding. Be careful brushing and flossing your teeth or using a toothpick because you may get an infection or bleed more easily. If you have any dental work done, tell your dentist you are receiving this medicine. Avoid taking products that contain aspirin, acetaminophen, ibuprofen, naproxen, or ketoprofen unless instructed by your doctor. These medicines may hide a fever. Women should inform their doctor if they wish to become pregnant or think they might be pregnant. There is a potential for serious side effects to an unborn child. Talk to your health care professional or pharmacist for more information. Do not breast-feed an infant while taking this medicine. What side effects may I notice from receiving this medicine? Side effects that you should report to your doctor or health care professional as soon as possible: -allergic reactions like skin rash, itching or hives, swelling of the face, lips, or tongue -low blood counts - this medicine may decrease the number of white blood cells, red blood cells and platelets. You may be at increased risk for infections and bleeding. -signs of infection - fever or chills, cough, sore throat, pain or difficulty passing urine -signs of decreased platelets or bleeding - bruising, pinpoint red spots on the skin, black, tarry stools, blood in the urine -signs of decreased red blood cells - unusually weak or tired, fainting spells, lightheadedness -breathing problems -chest pain -mouth sores -nausea and vomiting -pain, swelling, redness at site where injected -pain, tingling, numbness in the hands or feet -stomach pain -swelling of ankles, feet, hands -unusual bleeding Side effects that usually do not require medical attention (report to your doctor or health care professional if they continue or are bothersome): -constipation -diarrhea -hair loss -loss of appetite -stomach upset This list may not describe all  possible side effects. Call your doctor for medical advice about side effects. You may report side effects to FDA at 1-800-FDA-1088. Where should I keep my medicine? This drug is given in a hospital or clinic and will not be stored at home. NOTE: This sheet is a summary. It may not cover all possible information. If you have questions about this medicine, talk to your doctor, pharmacist, or health care provider.    2016, [employer]. (2007-09-08 18:45:54)  

## 2016-03-15 NOTE — Progress Notes (Signed)
Total bilirubin of 1.69 and ALT of 99; okay to treat per Dr. Burr Medico.

## 2016-03-16 LAB — CANCER ANTIGEN 19-9: CAN 19-9: 4 U/mL (ref 0–35)

## 2016-03-18 ENCOUNTER — Telehealth: Payer: Self-pay | Admitting: Medical Oncology

## 2016-03-18 NOTE — Telephone Encounter (Signed)
Not able to leave vm for chemo f/u.

## 2016-03-19 ENCOUNTER — Telehealth: Payer: Self-pay | Admitting: *Deleted

## 2016-03-19 NOTE — Telephone Encounter (Signed)
Voice mail from [place] that Mr. Fredericks appears more jaundiced to him. Forwarded call to collaborative nurse.

## 2016-03-19 NOTE — Telephone Encounter (Signed)
Received vm from step-son, Reginald Ochoa asking for return call due to pt's skin complexion looking orange-yellowish.  Returned call & asked if white of pt's eyes looked yellow & he reports no.  He states that pt has no fever & is a little nauseas but eating & also questioned whether prior Josem Kaufmann has been done on his zofran.  Message sent to [place] to check on PA.  Pt has appt soon & informed Reginald Ochoa to call back if he thinks that pt's eyes look yellow before that visit.  Verified with Dr Burr Medico.

## 2016-03-20 ENCOUNTER — Telehealth: Payer: Self-pay | Admitting: *Deleted

## 2016-03-20 DIAGNOSIS — C787 Secondary malignant neoplasm of liver and intrahepatic bile duct: Principal | ICD-10-CM

## 2016-03-20 DIAGNOSIS — C259 Malignant neoplasm of pancreas, unspecified: Secondary | ICD-10-CM

## 2016-03-20 MED ORDER — ONDANSETRON HCL 8 MG PO TABS: 8.0000 mg | ORAL_TABLET | Freq: Two times a day (BID) | ORAL | 1 refills | Status: DC | PRN

## 2016-03-20 MED FILL — ONDANSETRON HCL 8 MG TABLET: 8 | 15 days supply | Qty: 30 | Fill #0

## 2016-03-20 NOTE — Telephone Encounter (Signed)
Notified step-son that he can get #30 Zofran at [facility] for $9.00. He will go to pick up the medication now.

## 2016-03-20 NOTE — Telephone Encounter (Signed)
Oncology Nurse Navigator Documentation  Oncology Nurse Navigator Flowsheets 03/20/2016  Navigator Location [facility]  Referral date to RadOnc/MedOnc -  Navigator Encounter Type Telephone  Telephone Medication Assistance--Zofran needs prior authorization  Abnormal Finding Date -  Confirmed Diagnosis Date -  Patient Visit Type -  Treatment Phase -  Barriers/Navigation Needs Coordination of Care-forwarded PA information to [employer], financial advocate  Education -  Interventions Medication Assistance--per [facility], our office needs to call #[phone] to complete the prior authorization.  Referrals Financial Counseling  Education Method Verbal  Support Groups/Services -  Acuity -  Time Spent with Patient 30  Pharmacist informed nurse that two Zofran tablets would cost $11.99 out of pocket if they wish to do this while waiting for the PA to process.  [place] and he can fill the #30 pills there out of pocket for $9.00. Called [facility] and cancelled the script there and called to [place]. Notified financial advocate to cancel the PA.

## 2016-03-21 NOTE — Progress Notes (Addendum)
[place]  [phone] [phone]  Clinic Follow Up Note   Patient Care Team: Lucianne Lei, MD as PCP - General (Family Medicine) 03/22/2016    CHIEF COMPLAINTS:  Follow up metastatic pancreatic adenocarcinoma  Oncology History   Pancreatic cancer metastasized to liver [facility])   Staging form: Pancreas, AJCC 7th Edition   - Clinical stage from 03/01/2016: Stage IV (T2, N1, M1) - Signed by Truitt Merle, MD on 03/07/2016      Pancreatic cancer metastasized to liver [facility])   02/28/2016 - 03/01/2016 Hospital Admission    Pt presented with syncope and diarrhea. c-diff (-), he underwent liver biopsy and CT scans, and was discharged home.       03/01/2016 Initial Diagnosis    Pancreatic cancer metastasized to liver ([facility])      03/01/2016 Initial Biopsy    Liver biopsy showed adenocarcinoma, features are consistent with metastatic adenocarcinoma      03/01/2016 Imaging    CT chest, abdomen and pelvis with contrast showed hyperenhancing mass in the head and uncinate process of the pancreas, with small nodular density anterior to the mass is concerning for cellulitis lesions, multiple hepatic hypodensities lesions most compatible with metastatic disease.      03/01/2016 Tumor Marker    CEA 10.5, CA19.9 2         HISTORY OF PRESENTING ILLNESS (03/08/2016):  Reginald Ochoa 75 y.o. male with past medical history of hypertension, asthma, depression, anxiety, and alcohol abuse, is here because of His recent diagnosed metastatic pancreatic adenocarcinoma. He is accompanied by his wife [name] and son [name] to my clinic today.  He has been having mild diarrhea for the past 2-3 weeks, lose stool, 3-5 times a day. 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 He may lost consciousness for a short period time. He was found by EMS which happened to be there, and was send to ED.He was found to have positive orthostatic vital signs  in ED. CT abdomen/pelvis showed possible pancreatic cancerwith possible liver metastasis. He was admitted to the hospital, underwent liver biopsy, and was discharged home the next day.   He has no nausea, no pain, diarrhea improved lately,  2-3 times a day, he has mild fatigue, appetite is good. 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 He is able to do all his ADLs, but not much housework or other activities.   He had three TIA, which affected his vision, he does not see well in the dark. He lives with his wife.  He has been on oxycodone since 1998 for head and neck pain since he had fall and neck injury.  he used to drink alcohol heavily, but has stopped 20 years ago. He has history of heavy smoking, still smokes half pack cigarettes a day.  CURRENT THERAPY: First line chemotherapy weekly gemcitabine 1000 to prevent per square meter, 2-3 weeks on, one-week off, started on 04/11/2016  Interval History: [name] returns for follow up and second weekly gemcitabine.  States chemotherapy went reasonably well. He reports spells of nausea in the mornings, "it comes and goes in waves". He occasionally has nausea in the evenings. He reports vomiting. He recently was able to fill his Zofran prescription at the [facility]. Compazine did not work as well as Zofran does. He has been eating reasonably well. He has not noticed any changes in his urine. He denies fever or  chills. He denies constipation. He has not met with the dietician. He appears to be jaundiced today, no pruritus, no mental status change.  MEDICAL HISTORY:  Past Medical History:  Diagnosis Date  . Alcohol abuse   . Asthma   . Depression   . Depression with anxiety   . GIB (gastrointestinal bleeding)   . Hypertension   . Pancreatic mass   . Tobacco abuse     SURGICAL HISTORY: Past Surgical History:  Procedure Laterality Date  . bleeding ulcer    . TONSILLECTOMY       SOCIAL HISTORY: Social History   Social History  . Marital status: Married    Spouse name: [name]  . Number of children: 3  . Years of education: N/A   Occupational History  . Author     Architect   Social History Main Topics  . Smoking status: Current Every Day Smoker    Packs/day: 0.50    Years: 40.00    Types: Cigarettes, Cigars  . Smokeless tobacco: Never Used  . Alcohol use Yes     Comment: used to be a heavy drinker and stopped 20 years ago    . Drug use: No  . Sexual activity: Not on file   Other Topics Concern  . Not on file   Social History Narrative   Married to wife, [name] for 35+ years. 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 Stroke Mother   . Cancer Maternal Uncle     bvrain tumor   . Cancer Cousin     brain tumor     ALLERGIES:  has No Known Allergies.  MEDICATIONS:  Current Outpatient Prescriptions  Medication Sig Dispense Refill  . albuterol (PROVENTIL HFA;VENTOLIN HFA) 108 (90 BASE) MCG/ACT inhaler Inhale 2 puffs into the lungs every 6 (six) hours as needed for wheezing or shortness of breath.    . ALPRAZolam (XANAX) 1 MG tablet Take 0.5-1 mg by mouth 2 (two) times daily as needed for anxiety. Anxiety     . amLODipine (NORVASC) 2.5 MG tablet Take 1 tablet (2.5 mg total) by mouth daily. 30 tablet 0  . amphetamine-dextroamphetamine (ADDERALL) 10 MG tablet Take 5-10 mg by mouth 2 (two) times daily. '10mg'$  in the morning and '5mg'$  at lunch.    Marland Kitchen aspirin 325 MG tablet Take 325 mg by mouth every 6 (six) hours as needed for headache.    . budesonide-formoterol (SYMBICORT) 160-4.5 MCG/ACT inhaler Inhale 2 puffs into the lungs 2 (two) times daily.    . cetirizine (ZYRTEC) 10 MG tablet Take 10 mg by mouth at bedtime.    Marland Kitchen  FLUoxetine (PROZAC) 40 MG capsule Take 40 mg by mouth daily with breakfast.      . guaiFENesin (MUCINEX) 600 MG 12 hr tablet Take 1 tablet (600 mg total) by mouth 2 (two) times daily. 20 tablet 0  . HYDROcodone-acetaminophen (NORCO) 10-325 MG tablet Take 1 tablet by mouth daily as needed for moderate pain.     Marland Kitchen LUTEIN PO Take 1 tablet by mouth daily.    . Multiple Vitamins-Minerals (MULTIVITAMIN WITH MINERALS) tablet Take 1 tablet by mouth daily.     . ondansetron (ZOFRAN) 8 MG tablet Take 1 tablet (8 mg total)  by mouth 2 (two) times daily as needed (Nausea or vomiting). 30 tablet 1  . OVER THE COUNTER MEDICATION Place 2 sprays into both nostrils as needed (for congestion). [facility] Nasal Spray for congestion    . Pancrelipase, Lip-Prot-Amyl, (CREON) 24000-76000 units CPEP Take 1 capsule (24,000 Units total) by mouth 3 (three) times daily with meals. 90 capsule 1  . prochlorperazine (COMPAZINE) 10 MG tablet Take 1 tablet (10 mg total) by mouth every 6 (six) hours as needed (Nausea or vomiting). 30 tablet 1  . senna-docusate (SENOKOT-S) 8.6-50 MG tablet Take 1 tablet by mouth at bedtime as needed for mild constipation. 14 tablet 0  . ondansetron (ZOFRAN ODT) 8 MG disintegrating tablet Take 1 tablet (8 mg total) by mouth every 8 (eight) hours as needed for nausea or vomiting. 30 tablet 1   No current facility-administered medications for this visit.     REVIEW OF SYSTEMS:   Constitutional: Denies fevers, chills or abnormal night sweats  Eyes: Denies blurriness of vision, double vision or watery eyes Ears, nose, mouth, throat, and face: Denies mucositis or sore throat Respiratory: Denies cough, dyspnea or wheezes Cardiovascular: Denies palpitation, chest discomfort or lower extremity swelling Gastrointestinal:  Denies heartburn or change in bowel habits (+) nausea and vomiting Skin: Denies abnormal skin rashes Lymphatics: Denies new lymphadenopathy or easy bruising Neurological:Denies numbness,  tingling or new weaknesses Behavioral/Psych: Mood is stable, no new changes  All other systems were reviewed with the patient and are negative.  PHYSICAL EXAMINATION: ECOG PERFORMANCE STATUS: 1 - Symptomatic but completely ambulatory  Vitals:   03/22/16 0932  BP: 130/83  Pulse: 94  Resp: 17  Temp: 98.5 F (36.9 C)   Filed Weights   03/22/16 0932  Weight: 127 lb (57.6 kg)    GENERAL:alert, no distress and comfortable (+) jaundice SKIN: skin color, texture, turgor are normal, no rashes or significant lesions EYES: normal, conjunctiva are pink and non-injected, sclera clear OROPHARYNX:no exudate, no erythema and lips, buccal mucosa, and tongue normal  NECK: supple, thyroid normal size, non-tender, without nodularity LYMPH:  no palpable lymphadenopathy in the cervical, axillary or inguinal LUNGS: clear to auscultation and percussion with normal breathing effort HEART: regular rate & rhythm and no murmurs and no lower extremity edema ABDOMEN:abdomen soft, non-tender and normal bowel sounds Musculoskeletal:no cyanosis of digits and no clubbing  PSYCH: alert & oriented x 3 with fluent speech NEURO: no focal motor/sensory deficits   LABORATORY DATA:  I have reviewed the data as listed CBC Latest Ref Rng & Units 03/22/2016 03/15/2016 03/06/2016  WBC 4.0 - 10.3 10e3/uL 6.2 16.6(H) 11.9(H)  Hemoglobin 13.0 - 17.1 g/dL 11.6(L) 12.9(L) 11.4(L)  Hematocrit 38.4 - 49.9 % 35.7(L) 39.5 34.2(L)  Platelets 140 - 400 10e3/uL 135(L) 220 187   CMP Latest Ref Rng & Units 03/22/2016 03/15/2016 03/07/2016  Glucose 70 - 140 mg/dl 283(H) 198(H) 148(H)  BUN 7.0 - 26.0 mg/dL 21.5 21.8 19  Creatinine 0.7 - 1.3 mg/dL 1.2 1.1 1.31(H)  Sodium 136 - 145 mEq/L 136 139 141  Potassium 3.5 - 5.1 mEq/L 3.9 3.5 4.0  Chloride 101 - 111 mmol/L - - 108  CO2 22 - 29 mEq/L '26 26 26  '$ Calcium 8.4 - 10.4 mg/dL 8.8 8.4 8.4(L)  Total Protein 6.4 - 8.3 g/dL 6.0(L) 5.9(L) -  Total Bilirubin 0.20 - 1.20 mg/dL 7.10(HH)  1.69(H) -  Alkaline Phos 40 - 150 U/L 524(H) 101 -  AST 5 - 34 U/L 93(H) 42(H) -  ALT 0 -  55 U/L 166(H) 99(H) -   PATHOLOGY REPORT  Diagnosis 03/01/2016 Liver, needle/core biopsy, Right Lobe - ADENOCARCINOMA. Microscopic Comment The features are consistent with metastatic adenocarcinoma. (JDP:kh 03/04/16)   RADIOGRAPHIC STUDIES: I have personally reviewed the radiological images as listed and agreed with the findings in the report. Dg Chest 2 View  Result Date: 02/29/2016 CLINICAL DATA:  Initial valuation for acute cough. EXAM: CHEST  2 VIEW COMPARISON:  None. FINDINGS: Cardiac and mediastinal silhouettes are within normal limits. Lungs normally inflated. No focal trait, pulmonary edema, or pleural effusion. No pneumothorax. No acute osseous abnormality. IMPRESSION: No active cardiopulmonary disease. Electronically Signed   By: Jeannine Boga M.D.   On: 02/29/2016 03:07   Ct Head Wo Contrast  Result Date: 02/28/2016 CLINICAL DATA:  Hit top of head now with small hematoma. EXAM: CT HEAD WITHOUT CONTRAST TECHNIQUE: Contiguous axial images were obtained from the base of the skull through the vertex without intravenous contrast. COMPARISON:  05/09/2014 FINDINGS: Brain: Similar findings of advanced atrophy with sulcal prominence centralized volume loss with commensurate ex vacuo dilatation of the ventricular system. Stable sequela of prior right PCA distribution infarct with associated asymmetric ex vacuo dilatation involving the occipital horn of the right lateral ventricle. Scattered minimal periventricular hypodensities compatible microvascular ischemic disease. Given extensive background parenchymal abnormalities, there is no CT evidence superimposed acute large territory infarct. Punctate (approximately 3 mm) suspected meningioma about the inner table of the right frontal cortex (coronal image 26, series 205). Otherwise, no intraparenchymal or extra-axial mass. No intraparenchymal or  extra-axial hemorrhage. Unchanged size and configuration of the ventricles and the basilar cisterns. No midline shift. Vascular: Intracranial atherosclerosis. Skull: No displaced calvarial fracture. Sinuses/Orbits: There is underpneumatization the bilateral frontal sinuses. A tooth is again noted to be impacted within the caudal aspect of the left maxillary sinus. The remaining paranasal sinuses and mastoid air cells are normally aerated. No air-fluid levels. Post left-sided cataract surgery. Other: Unchanged dermal calcification is noted about the right parietal calvarium (image 19, series 205). Regional soft tissues appear normal. No radiopaque foreign body. 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 Electronically Signed   By: Sandi Mariscal M.D.   On: 02/28/2016 17:51   Ct Chest W Contrast  Result Date: 03/01/2016 CLINICAL DATA:  History of pancreatic mass and asthma EXAM: CT CHEST WITH CONTRAST TECHNIQUE: Multidetector CT imaging of the chest was performed during intravenous contrast administration. CONTRAST:  9m ISOVUE-300 IOPAMIDOL (ISOVUE-300) INJECTION 61% COMPARISON:  02/29/2016 FINDINGS: Cardiovascular: Thoracic aorta show some mild atherosclerotic calcifications without aneurysmal dilatation or dissection. Mild coronary calcifications are seen. Pulmonary artery is incompletely evaluated. Cardiac structures show no acute abnormality. Mediastinum/Nodes: The thoracic inlet is within normal limits. No significant hilar or mediastinal adenopathy is noted. No axillary adenopathy is seen. The esophagus as visualized is within normal limits. Lungs/Pleura: Mild emphysematous changes are noted. No focal infiltrate or sizable effusion is seen. No nodular changes are noted. Upper Abdomen: The upper abdomen demonstrates multiple ring-enhancing lesions consistent with hepatic metastatic disease. The  known pancreatic head mass is incompletely evaluated on this exam. Musculoskeletal: Degenerative changes of the thoracic spine are noted. No definitive osseous metastatic disease is seen. IMPRESSION: Changes consistent with the known hepatic metastatic disease. No acute intrathoracic abnormality is noted. Mild emphysematous changes. Electronically Signed   By: MInez CatalinaM.D.   On: 03/01/2016 07:30   Ct Abdomen Pelvis W Contrast  Result Date: 02/29/2016  CLINICAL DATA:  75 year old male with diarrhea and diffuse abdominal pain. EXAM: CT ABDOMEN AND PELVIS WITH CONTRAST TECHNIQUE: Multidetector CT imaging of the abdomen and pelvis was performed using the standard protocol following bolus administration of intravenous contrast. CONTRAST:  86m ISOVUE-300 IOPAMIDOL (ISOVUE-300) INJECTION 61% COMPARISON:  None. FINDINGS: Lower chest: The visualized lung bases are clear. No intra-abdominal free air.  Trace perihepatic free fluid. Hepatobiliary: There are multiple hepatic hypodense lesions which demonstrate somewhat ill-defined margins. The largest lesion is in the dome of the liver and measures 2.0 x 1.5 cm. These lesions are not well characterized but are concerning for metastatic disease. Further evaluation with MRI without and with contrast is recommended. Mild periportal edema. No biliary ductal dilatation. The gallbladder is mildly distended. No calcified gallstone identified. Pancreas: There is a 3.5 x 2.8 x 2.8 cm hypoenhancing mass in the head and uncinate process of the pancreas. There is associated gland atrophy with mild dilatation of the main pancreatic duct. Spleen: Choose 1 Adrenals/Urinary Tract: The adrenal glands appear unremarkable. Mild bilateral renal atrophy. Subcentimeter right renal upper pole hypodense lesion is not well characterized but most likely represents a cyst. There is no hydronephrosis on either side. The visualized ureters appear unremarkable. There is thickened and trabecular  appearance of the urinary bladder likely related to chronic bladder outlet obstruction. Correlation with urinalysis recommended to exclude UTI. Stomach/Bowel: There is sigmoid diverticulosis with muscular hypertrophy. Underlying colonic mass is not excluded. Follow-up with colonoscopy recommended. Mild haziness of the sigmoid colon may be chronic or represent mild diverticulitis. Scattered colonic diverticula noted. There is no evidence of bowel obstruction. Normal appendix. Vascular/Lymphatic: There is moderate aortoiliac atherosclerotic disease. The abdominal aorta and IVC are otherwise unremarkable. The origins of the celiac axis, SMA, IMA as well as the origins of the renal arteries appear patent. There is no infiltration of the fat surrounding the celiac axis or SMA. The SMV, splenic vein, and main portal vein are patent. No portal venous gas identified. A nodular density measuring 7 mm in short anterior to the pancreatic mass may represent a slightly enlarged lymph node or a satellite lesion. No other adenopathy identified. A smaller nodular density noted posterior to the pancreatic mass and anterior to the IVC (series 2, image 24). Reproductive: Heterogeneous prostate gland. Other: There is loss of subcutaneous fat predominantly involving the lower chest and cachexia. Musculoskeletal: Degenerative changes of the spine. Disc desiccation with vacuum phenomenon in the lower lumbar spine. No acute fracture. 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 Further evaluation with MRI without and with contrast and MRCP recommended. Small nodular density anterior to the pancreatic mass is concerning for satellite lesions. Multiple hepatic hypodense lesions most compatible with metastatic disease. No evidence of celiac or SMA encasement. Sigmoid diverticulosis with muscular hypertrophy. Mild  sigmoid diverticulitis is not excluded. No bowel obstruction. Normal appendix. Follow-up with nonemergent colonoscopy recommended to exclude underlying sigmoid mass. Electronically Signed   By: AAnner CreteM.D.   On: 02/29/2016 00:20   [place]  Result Date: 03/01/2016 CLINICAL DATA:  Mass of head of the pancreas with multiple liver lesions. The patient presents for biopsy of a liver lesion. EXAM: ULTRASOUND GUIDED CORE BIOPSY OF LIVER MEDICATIONS: 1.5 mg IV Versed; 75 mcg IV Fentanyl Total Moderate Sedation Time: 15 minutes. The patient's level of consciousness and physiologic status were continuously monitored during the procedure by Radiology nursing. PROCEDURE:  The procedure, risks, benefits, and alternatives were explained to the patient. Questions regarding the procedure were encouraged and answered. The patient understands and consents to the procedure. A time out was performed prior to initiating the procedure. The right abdominal wall was prepped with chlorhexidine in a sterile fashion, and a sterile drape was applied covering the operative field. A sterile gown and sterile gloves were used for the procedure. Local anesthesia was provided with 1% Lidocaine. Ultrasound was used to localize liver lesions. A lesion in the right lobe of the liver was chosen. Under ultrasound guidance, a 17 gauge needle was advanced to the lesion. A total of 3 separate coaxial 18 gauge core biopsy samples were obtained through the lesion. Material was submitted in formalin. COMPLICATIONS: None. FINDINGS: The best visualized lesion was a roughly 1.7 cm partially necrotic lesion within the posterior right lobe of the liver. Samples were obtained through this lesion. IMPRESSION: Ultrasound-guided core biopsy performed a lesion in the right lobe of the liver. Electronically Signed   By: Aletta Edouard M.D.   On: 03/01/2016 16:11   Dg Chest Port 1 View  Result Date: 03/05/2016 CLINICAL DATA:  Shortness of breath, no  chest pain EXAM: PORTABLE CHEST 1 VIEW COMPARISON:  Chest x-ray 02/29/2016 FINDINGS: The heart size and mediastinal contours are within normal limits. Both lungs are clear. The visualized skeletal structures are unremarkable. IMPRESSION: No active disease. Electronically Signed   By: Kathreen Devoid   On: 03/05/2016 08:23    ASSESSMENT & PLAN: 75 year old Caucasian male, with past medical history of hypertension, depression, anxiety, alcohol and tobacco abuse, presented with diarrhea and fall.  1. Metastatic pancreatic adenocarcinoma to liver -I previously reviewed his CT scan findings and the biopsy results with patient and his family members in details. -His liver biopsy confirmed metastatic adenocarcinoma, giving the large size of tumor in the pancreatic head, otherwise negative CT scans, I think this is metastatic adenocarcinoma from pancreas.  -We discussed that his cancer is incurable at this stage, and overall prognosis is very poor. Median survival is about 4-6 months. -We discussed the goal of therapy is palliative, to prolong his life and to preserve his quality of life. -We discussed systemic therapeutic options, including chemotherapy and immunotherapy. We discussed prior chemotherapy regiment and the response rate. Given his limited performance status, advanced age and medical comorbidities, I recommended him to start with single agent gemcitabine. If he tolerates well. Her cycle, I'll add Abraxane. I do not think he is a candidate for more intensive chemotherapy, such as FOLFIRINOX -Alternatively, palliative care alone, was also discussed with patient. He would like to try chemotherapy first. -He has started palliative chemotherapy therapy with single agent gemcitabine last week, tolerated well -Labs reviewed. Jaundiced appearance, Total bilirubin 7.10, significantly increased from last week -Due to his significant worsening hyperbilirubinemia, we'll hold chemotherapy today   2.  Hyperbilirubinemia -Likely secondary to his underlying malignancy (papillary cancer and liver metastasis), chemotherapy-induced hyperbilirubinemia is also possible, less likely -Stat abdominal ultrasound today  3. HTN -His amlodipine, but was found to have orthostatic hypotension, probably related to his diarrhea. -I encouraged him to monitor his blood pressure closely at home, and hold chemotherapy if his blood pressure is low or he feels dizzy. -We'll monitor his blood pressure closely during the chemotherapy.  4. Nausea -Probably related to pancreatic and insufficiency from hepatic cancer  -I called in dissolvable zofran which may be easier for him to take  5. Anxiety, depression -Continue medication. I encouraged  him to be positive  6. History of alcohol abuse, active smoker -I encouraged him to quit smoking, smoking cessation was discussed with patient and his family -He is willing to cut down his smoking   Plan: -Labs reviewed. Elevated bilirubin. Based on these results, we will hold chemo today -I called in dissolvable zofran -I referred the patient for dietician consultation -STAT Abdominal Ultrasound to investigate biliary blockage was scheduled for noon today, but not able to do because he ate earlier today, rescheduled for Monday morning -spoke with pt's son and instructed him to bring pt to ED if he became more symptomatic or develops fever over the weekend   All questions were answered. The patient knows to call the clinic with any problems, questions or concerns.  I spent 25 minutes counseling the patient face to face. The total time spent in the appointment was 40 minutes and more than 50% was on counseling.  This document serves as a record of services personally performed by Truitt Merle, MD. It was created on her behalf by Arlyce Harman, a trained medical scribe. The creation of this record is based on the scribe's personal observations and the provider's statements to  them. This document has been checked and approved by the attending provider.     Truitt Merle, MD 03/22/2016 11:22 AM   Addendum Pt's [place] was done this morning, which showed significant biliary dilatation, likely secondary to his pancreatic head mass. I explained this ultrasound findings with patient and his son in person, patient feels about same, denies any fever, chills, or abdominal pain. He was seen by Dr. Cristina Gong in the past, I will contact his office to see if they can see him asap and try ERCP and stent placement. The alternative option of percutaneous about drainage by interventional radiology was discussed with them also. I'll hold on chemotherapy for now, and see him back on 12/1.   Truitt Merle  03/25/2016 1:09 PM

## 2016-03-22 ENCOUNTER — Encounter: Payer: Self-pay | Admitting: Hematology

## 2016-03-22 ENCOUNTER — Ambulatory Visit (HOSPITAL_COMMUNITY)
Admission: RE | Admit: 2016-03-22 | Discharge: 2016-03-22 | Disposition: A | Discharge status: Home / Self Care | Payer: Medicare Other | Source: Ambulatory Visit | Attending: Hematology | Admitting: Hematology

## 2016-03-22 ENCOUNTER — Other Ambulatory Visit (HOSPITAL_BASED_OUTPATIENT_CLINIC_OR_DEPARTMENT_OTHER): Payer: Medicare Other

## 2016-03-22 ENCOUNTER — Ambulatory Visit: Payer: Medicare Other

## 2016-03-22 ENCOUNTER — Ambulatory Visit (HOSPITAL_BASED_OUTPATIENT_CLINIC_OR_DEPARTMENT_OTHER): Payer: Medicare Other | Admitting: Hematology

## 2016-03-22 ENCOUNTER — Encounter (HOSPITAL_COMMUNITY): Payer: Self-pay

## 2016-03-22 VITALS — BP 130/83 | HR 94 | Temp 98.5°F | Resp 17 | Ht 66.0 in | Wt 127.0 lb

## 2016-03-22 DIAGNOSIS — C259 Malignant neoplasm of pancreas, unspecified: Secondary | ICD-10-CM

## 2016-03-22 DIAGNOSIS — I10 Essential (primary) hypertension: Secondary | ICD-10-CM

## 2016-03-22 DIAGNOSIS — F419 Anxiety disorder, unspecified: Secondary | ICD-10-CM

## 2016-03-22 DIAGNOSIS — F329 Major depressive disorder, single episode, unspecified: Secondary | ICD-10-CM

## 2016-03-22 DIAGNOSIS — C250 Malignant neoplasm of head of pancreas: Secondary | ICD-10-CM | POA: Diagnosis not present

## 2016-03-22 DIAGNOSIS — Z720 Tobacco use: Secondary | ICD-10-CM

## 2016-03-22 DIAGNOSIS — R110 Nausea: Secondary | ICD-10-CM

## 2016-03-22 DIAGNOSIS — F418 Other specified anxiety disorders: Secondary | ICD-10-CM

## 2016-03-22 DIAGNOSIS — C787 Secondary malignant neoplasm of liver and intrahepatic bile duct: Principal | ICD-10-CM

## 2016-03-22 DIAGNOSIS — F1021 Alcohol dependence, in remission: Secondary | ICD-10-CM

## 2016-03-22 LAB — CBC WITH DIFFERENTIAL/PLATELET
BASO%: 0.4 % (ref 0.0–2.0)
Basophils Absolute: 0 10*3/uL (ref 0.0–0.1)
EOS%: 1.6 % (ref 0.0–7.0)
Eosinophils Absolute: 0.1 10*3/uL (ref 0.0–0.5)
HEMATOCRIT: 35.7 % — AB (ref 38.4–49.9)
HEMOGLOBIN: 11.6 g/dL — AB (ref 13.0–17.1)
LYMPH%: 18.6 % (ref 14.0–49.0)
MCH: 29.8 pg (ref 27.2–33.4)
MCHC: 32.6 g/dL (ref 32.0–36.0)
MCV: 91.5 fL (ref 79.3–98.0)
MONO#: 0.5 10*3/uL (ref 0.1–0.9)
MONO%: 8.8 % (ref 0.0–14.0)
NEUT%: 70.6 % (ref 39.0–75.0)
NEUTROS ABS: 4.3 10*3/uL (ref 1.5–6.5)
Platelets: 135 10*3/uL — ABNORMAL LOW (ref 140–400)
RBC: 3.9 10*6/uL — ABNORMAL LOW (ref 4.20–5.82)
RDW: 13.8 % (ref 11.0–14.6)
WBC: 6.2 10*3/uL (ref 4.0–10.3)
lymph#: 1.1 10*3/uL (ref 0.9–3.3)

## 2016-03-22 LAB — COMPREHENSIVE METABOLIC PANEL
ALBUMIN: 2.9 g/dL — AB (ref 3.5–5.0)
ALK PHOS: 524 U/L — AB (ref 40–150)
ALT: 166 U/L — ABNORMAL HIGH (ref 0–55)
AST: 93 U/L — AB (ref 5–34)
Anion Gap: 10 mEq/L (ref 3–11)
BILIRUBIN TOTAL: 7.1 mg/dL — AB (ref 0.20–1.20)
BUN: 21.5 mg/dL (ref 7.0–26.0)
CALCIUM: 8.8 mg/dL (ref 8.4–10.4)
CO2: 26 mEq/L (ref 22–29)
Chloride: 100 mEq/L (ref 98–109)
Creatinine: 1.2 mg/dL (ref 0.7–1.3)
EGFR: 58 mL/min/{1.73_m2} — AB (ref >90)
GLUCOSE: 283 mg/dL — AB (ref 70–140)
Potassium: 3.9 mEq/L (ref 3.5–5.1)
SODIUM: 136 meq/L (ref 136–145)
TOTAL PROTEIN: 6 g/dL — AB (ref 6.4–8.3)

## 2016-03-22 MED ORDER — ONDANSETRON 8 MG PO TBDP: 8.0000 mg | ORAL_TABLET | Freq: Three times a day (TID) | ORAL | 1 refills | Status: DC | PRN

## 2016-03-22 MED FILL — ONDANSETRON ODT 8 MG TABLET: 8 | 10 days supply | Qty: 30 | Fill #0

## 2016-03-22 NOTE — Progress Notes (Signed)
Unable to do ultrasound test as patient had eaten.  R/S for this Monday at 10:30am.   Dr. Burr Medico notified.  Patient has already left.   Called and spoke with son Reginald Ochoa.  Let him know that we will see them after ultrasound on Monday.  Either technician will call Dr. Burr Medico or they can come to cancer center after ultrasound.  In the meantime, if his father experiences any changes - such as fever, pain, nausea, mental status changes, confusion, etc they should go to the ED.  Discussed this with son and he verbalized understanding.

## 2016-03-22 NOTE — Progress Notes (Signed)
Met with patient per request of physician whom had financial concerns regarding medication in a different form. Called outpatient pharmacy to find out the price of the medication and it was the same as the previous prescription he picked up. Asked patient for verbal income. Approved patient for one-time $400 grant. Gave patient a copy of approval letter as well as the expense sheet along with the outpatient pharmacy contact information. Explained to patient and step-son how the grant works and to present as form of payment for the medication. They verbalized understanding and were very appreciative. Also gave them Lenise's card as the patient's financial advocate. Advised them if she discovers any other financial assistance that the patient may benefit from, she will reach out to the patient and advise at that time what documents he would need to bring if any and any other specific details. RN came in to advise them they were able to arrange the CT for today at 12 at [facility]. I advised them to check in at Radiology for this appointment. RN instructed them to wait for instructions from doctor before leaving.

## 2016-03-22 NOTE — Progress Notes (Signed)
Patient cancelled today due to abnormal labs per MD. Patient aware.

## 2016-03-25 ENCOUNTER — Telehealth: Payer: Self-pay | Admitting: *Deleted

## 2016-03-25 ENCOUNTER — Ambulatory Visit (HOSPITAL_COMMUNITY)
Admission: RE | Admit: 2016-03-25 | Discharge: 2016-03-25 | Disposition: A | Discharge status: Home / Self Care | Payer: Medicare Other | Source: Ambulatory Visit | Attending: Hematology | Admitting: Hematology

## 2016-03-25 ENCOUNTER — Telehealth: Payer: Self-pay | Admitting: Hematology

## 2016-03-25 DIAGNOSIS — C787 Secondary malignant neoplasm of liver and intrahepatic bile duct: Secondary | ICD-10-CM | POA: Insufficient documentation

## 2016-03-25 DIAGNOSIS — C259 Malignant neoplasm of pancreas, unspecified: Secondary | ICD-10-CM | POA: Diagnosis present

## 2016-03-25 NOTE — Telephone Encounter (Signed)
Spoke with Debra @ Dr. Osborn Coho office.  Informed Hilda Blades that last office notes along with results of US Abdomen done today will be faxed to Dr. Cristina Gong for review for Urgent visit and ERCP - as per Dr. Ernestina Penna instructions. Hilda Blades stated Dr. Cristina Gong is not in office this week;  However, md is on call all week.  Faxed above info to [employer].   Per Gearldine Shown will make sure Dr. Cristina Gong receive results. Dr. Osborn Coho     Phone    [phone]   ;     Fax     [phone].

## 2016-03-25 NOTE — Telephone Encounter (Signed)
Message sent to chemo scheduler to add treatment for 04/12/16, per schedule message box. Lab and follow up with Dr Burr Medico, scheduled for 04/12/16 per schedule message. All appointments for 03/29/16 was also cancelled per 03/25/16 schedule message. Copy of updated appointment schedule was given to patient.  03/25/16

## 2016-03-25 NOTE — Telephone Encounter (Signed)
Voice mail left by grandson, [name] that patient is having paracentesis now at [facility] and will be coming over to cancer center to wait for results and see Dr. Burr Medico. Wanted to give [place] a "heads up". Navigator unaware of an appointment with Dr. Burr Medico today. Forwarded message to collaborative nurse.

## 2016-03-26 ENCOUNTER — Telehealth: Payer: Self-pay | Admitting: *Deleted

## 2016-03-26 NOTE — Telephone Encounter (Signed)
Per LOS I have scheduled appts and notified the scheduler 

## 2016-03-28 ENCOUNTER — Other Ambulatory Visit: Payer: Self-pay | Admitting: Gastroenterology

## 2016-03-28 ENCOUNTER — Encounter (HOSPITAL_COMMUNITY): Payer: Self-pay | Admitting: *Deleted

## 2016-03-28 MED ORDER — DEXTROSE 5 % IV SOLN: 1.0000 g | Freq: Once | INTRAVENOUS | Status: DC

## 2016-03-28 NOTE — Progress Notes (Signed)
SDW-pre-op call completed by both pt and pt Spouse, Reginald Ochoa, with verbal consent from pt. Pt spouse denies that pt is under the care of a cardiologist. Spouse stated that pt has been having " heartburn since Tuesday, I had to prop him up on pillows last night." Spouse advised to make PCP aware. Spoke with Melissa, CMA, to make MD aware of pt C/O "Heartburn." Spouse made aware to have pt stop taking Aspirin ( doesn't take), vitamins, fish oil and herbal medications. Do not take any NSAIDs ie: Ibuprofen, Advil, Naproxen, BC and Goody Powder or any medication containing Aspirin. Spouse verbalized understanding of all pre-op instructions.

## 2016-03-29 ENCOUNTER — Ambulatory Visit: Payer: Medicare Other

## 2016-03-29 ENCOUNTER — Ambulatory Visit (HOSPITAL_COMMUNITY): Payer: Medicare Other

## 2016-03-29 ENCOUNTER — Ambulatory Visit (HOSPITAL_COMMUNITY): Payer: Medicare Other | Admitting: Anesthesiology

## 2016-03-29 ENCOUNTER — Encounter (HOSPITAL_COMMUNITY)
Admission: RE | Disposition: A | Discharge status: Home / Self Care | Payer: Self-pay | Source: Ambulatory Visit | Attending: Gastroenterology

## 2016-03-29 ENCOUNTER — Ambulatory Visit (HOSPITAL_COMMUNITY)
Admission: RE | Admit: 2016-03-29 | Discharge: 2016-03-29 | Disposition: A | Discharge status: Home / Self Care | Payer: Medicare Other | Source: Ambulatory Visit | Attending: Gastroenterology | Admitting: Gastroenterology

## 2016-03-29 ENCOUNTER — Encounter (HOSPITAL_COMMUNITY): Payer: Self-pay

## 2016-03-29 ENCOUNTER — Other Ambulatory Visit: Payer: Medicare Other

## 2016-03-29 DIAGNOSIS — F17200 Nicotine dependence, unspecified, uncomplicated: Secondary | ICD-10-CM | POA: Diagnosis not present

## 2016-03-29 DIAGNOSIS — R17 Unspecified jaundice: Secondary | ICD-10-CM | POA: Diagnosis present

## 2016-03-29 DIAGNOSIS — C787 Secondary malignant neoplasm of liver and intrahepatic bile duct: Secondary | ICD-10-CM | POA: Diagnosis not present

## 2016-03-29 DIAGNOSIS — F329 Major depressive disorder, single episode, unspecified: Secondary | ICD-10-CM | POA: Insufficient documentation

## 2016-03-29 DIAGNOSIS — F17210 Nicotine dependence, cigarettes, uncomplicated: Secondary | ICD-10-CM | POA: Insufficient documentation

## 2016-03-29 DIAGNOSIS — Z79899 Other long term (current) drug therapy: Secondary | ICD-10-CM | POA: Insufficient documentation

## 2016-03-29 DIAGNOSIS — K831 Obstruction of bile duct: Secondary | ICD-10-CM | POA: Insufficient documentation

## 2016-03-29 DIAGNOSIS — I10 Essential (primary) hypertension: Secondary | ICD-10-CM | POA: Diagnosis not present

## 2016-03-29 DIAGNOSIS — C259 Malignant neoplasm of pancreas, unspecified: Secondary | ICD-10-CM | POA: Insufficient documentation

## 2016-03-29 DIAGNOSIS — H548 Legal blindness, as defined in USA: Secondary | ICD-10-CM | POA: Insufficient documentation

## 2016-03-29 DIAGNOSIS — I69398 Other sequelae of cerebral infarction: Secondary | ICD-10-CM | POA: Insufficient documentation

## 2016-03-29 HISTORY — DX: Pneumonia, unspecified organism: J18.9

## 2016-03-29 HISTORY — DX: Myoneural disorder, unspecified: G70.9

## 2016-03-29 HISTORY — DX: Heartburn: R12

## 2016-03-29 HISTORY — DX: Headache: R51

## 2016-03-29 HISTORY — DX: Malignant (primary) neoplasm, unspecified: C80.1

## 2016-03-29 HISTORY — DX: Transient cerebral ischemic attack, unspecified: G45.9

## 2016-03-29 HISTORY — DX: Headache, unspecified: R51.9

## 2016-03-29 HISTORY — PX: ERCP: SHX5425

## 2016-03-29 HISTORY — DX: Adverse effect of unspecified anesthetic, initial encounter: T41.45XA

## 2016-03-29 HISTORY — DX: Other complications of anesthesia, initial encounter: T88.59XA

## 2016-03-29 HISTORY — DX: Gastro-esophageal reflux disease without esophagitis: K21.9

## 2016-03-29 SURGERY — ERCP, WITH INTERVENTION IF INDICATED
Anesthesia: General

## 2016-03-29 MED ORDER — INDOMETHACIN 50 MG RE SUPP
RECTAL | Status: DC | PRN
  Administered 2016-03-29: 100 mg via RECTAL

## 2016-03-29 MED ORDER — GLUCAGON HCL RDNA (DIAGNOSTIC) 1 MG IJ SOLR
INTRAMUSCULAR | Status: AC
Start: 2016-03-29 — End: 2016-03-29
  Filled 2016-03-29: qty 1

## 2016-03-29 MED ORDER — CIPROFLOXACIN IN D5W 400 MG/200ML IV SOLN
INTRAVENOUS | Status: AC
  Filled 2016-03-29: qty 200

## 2016-03-29 MED ORDER — CIPROFLOXACIN IN D5W 400 MG/200ML IV SOLN
400.0000 mg | Freq: Once | INTRAVENOUS | Status: AC
  Administered 2016-03-29: 400 mg via INTRAVENOUS

## 2016-03-29 MED ORDER — PROPOFOL 10 MG/ML IV BOLUS
INTRAVENOUS | Status: DC | PRN
  Administered 2016-03-29: 200 mg via INTRAVENOUS

## 2016-03-29 MED ORDER — INDOMETHACIN 50 MG RE SUPP
RECTAL | Status: AC
  Filled 2016-03-29: qty 2

## 2016-03-29 MED ORDER — LIDOCAINE HCL (CARDIAC) 20 MG/ML IV SOLN
INTRAVENOUS | Status: DC | PRN
  Administered 2016-03-29: 60 mg via INTRAVENOUS

## 2016-03-29 MED ORDER — LACTATED RINGERS IV SOLN
INTRAVENOUS | Status: DC
  Administered 2016-03-29: 08:00:00 via INTRAVENOUS
  Administered 2016-03-29: 1000 mL via INTRAVENOUS

## 2016-03-29 MED ORDER — IOPAMIDOL (ISOVUE-300) INJECTION 61%
INTRAVENOUS | Status: DC | PRN
  Administered 2016-03-29: 80 mL

## 2016-03-29 MED ORDER — IOPAMIDOL (ISOVUE-300) INJECTION 61%
INTRAVENOUS | Status: AC
Start: 2016-03-29 — End: 2016-03-29
  Filled 2016-03-29: qty 50

## 2016-03-29 MED ORDER — SUCCINYLCHOLINE CHLORIDE 20 MG/ML IJ SOLN
INTRAMUSCULAR | Status: DC | PRN
  Administered 2016-03-29: 120 mg via INTRAVENOUS

## 2016-03-29 MED ORDER — ROCURONIUM BROMIDE 100 MG/10ML IV SOLN
INTRAVENOUS | Status: DC | PRN
  Administered 2016-03-29: 20 mg via INTRAVENOUS

## 2016-03-29 MED ORDER — NEOSTIGMINE METHYLSULFATE 10 MG/10ML IV SOLN
INTRAVENOUS | Status: DC | PRN
  Administered 2016-03-29: 2 mg via INTRAVENOUS

## 2016-03-29 MED ORDER — PHENYLEPHRINE HCL 10 MG/ML IJ SOLN
INTRAVENOUS | Status: DC | PRN
  Administered 2016-03-29: 20 ug/min via INTRAVENOUS

## 2016-03-29 MED ORDER — FENTANYL CITRATE (PF) 100 MCG/2ML IJ SOLN
INTRAMUSCULAR | Status: DC | PRN
  Administered 2016-03-29: 50 ug via INTRAVENOUS

## 2016-03-29 MED ORDER — SODIUM CHLORIDE 0.9 % IV SOLN: INTRAVENOUS | Status: DC

## 2016-03-29 NOTE — Anesthesia Preprocedure Evaluation (Addendum)
Anesthesia Evaluation  Patient identified by MRN, date of birth, ID band Patient awake    Reviewed: Allergy & Precautions, NPO status , Patient's Chart, lab work & pertinent test results  Airway Mallampati: II       Dental  (+) Poor Dentition   Pulmonary Current Smoker,    Pulmonary exam normal        Cardiovascular hypertension, Pt. on medications Normal cardiovascular exam     Neuro/Psych negative psych ROS   GI/Hepatic Neg liver ROS,   Endo/Other  negative endocrine ROS  Renal/GU   negative genitourinary   Musculoskeletal negative musculoskeletal ROS (+)   Abdominal Normal abdominal exam  (+)   Peds negative pediatric ROS (+)  Hematology negative hematology ROS (+)   Anesthesia Other Findings   Reproductive/Obstetrics negative OB ROS                            Anesthesia Physical Anesthesia Plan  ASA: III  Anesthesia Plan: General   Post-op Pain Management:    Induction: Intravenous  Airway Management Planned: Oral ETT  Additional Equipment:   Intra-op Plan:   Post-operative Plan: Extubation in OR  Informed Consent: I have reviewed the patients History and Physical, chart, labs and discussed the procedure including the risks, benefits and alternatives for the proposed anesthesia with the patient or authorized representative who has indicated his/her understanding and acceptance.   Dental advisory given  Plan Discussed with: CRNA and Surgeon  Anesthesia Plan Comments:        Anesthesia Quick Evaluation

## 2016-03-29 NOTE — Addendum Note (Signed)
Addendum  created 03/29/16 1110 by Lyn Hollingshead, MD   Sign clinical note

## 2016-03-29 NOTE — Anesthesia Postprocedure Evaluation (Addendum)
Anesthesia Post Note  Patient: Reginald Ochoa  Procedure(s) Performed: Procedure(s) (LRB): ENDOSCOPIC RETROGRADE CHOLANGIOPANCREATOGRAPHY (ERCP) (N/A)  Patient location during evaluation: Endoscopy Anesthesia Type: General Level of consciousness: awake and sedated Pain management: pain level controlled Vital Signs Assessment: post-procedure vital signs reviewed and stable Respiratory status: spontaneous breathing Cardiovascular status: stable Postop Assessment: no signs of nausea or vomiting Anesthetic complications: no     Last Vitals:  Vitals:   03/29/16 1020 03/29/16 1030  BP: (!) 155/96 (!) 131/94  Pulse: (!) 106 76  Resp: (!) 22 20  Temp:      Last Pain:  Vitals:   03/29/16 1015  TempSrc: Oral   Pain Goal:                 Haely Leyland JR,JOHN Raliyah Montella

## 2016-03-29 NOTE — Progress Notes (Signed)
Reginald Ochoa 8:19 AM  Subjective: Patient without any new complaints since he was saw Wednesday and our office by my partner Dr. Michail Sermon and his case discussed with him and his son and my partner Dr. B and his case discussed with the son Tuesday on the phone and his hospital computer chart reviewed Objective: Vital signs stable afebrile no acute distress exam please see preassessment evaluation labs and x-rays reviewed  Assessment: Pancreatic cancer with obstruction and metastases  Plan: Okay to attempt ERCP and stenting with anesthesia assistance  [facility]  Pager [phone] After 5PM or if no answer call [phone]

## 2016-03-29 NOTE — Progress Notes (Signed)
Late entry: Patient in an extended recovery period due to being unsteady on his feet. GI MDs made aware. Vital signs stable and continuous monitoring during recovery period. Waiting until patient's son felt more comfortable with taking on responsibility for getting patient into car and into house.

## 2016-03-29 NOTE — Op Note (Addendum)
[facility] Patient Name: Reginald Ochoa Procedure Date : 03/29/2016 MRN: [id number] Attending MD: Clarene Essex , MD Date of Birth: 20-Dec-1940 CSN: [id number] Age: 75 Admit Type: Inpatient Procedure:                ERCP Indications:              Biliary dilation on Ultrasound, Jaundice Providers:                Clarene Essex, MD, Cleda Daub, RN, Carolynn Comment, RN, Izora Gala, CRNA Referring MD:              Medicines:                General Anesthesia Complications:            No immediate complications. Estimated Blood Loss:     Estimated blood loss: none. Procedure:                Pre-Anesthesia Assessment:                           - Prior to the procedure, a History and Physical                            was performed, and patient medications and                            allergies were reviewed. The patient's tolerance of                            previous anesthesia was also reviewed. The risks                            and benefits of the procedure and the sedation                            options and risks were discussed with the patient.                            All questions were answered, and informed consent                            was obtained. Prior Anticoagulants: The patient has                            taken aspirin, last dose was 1 day prior to                            procedure. ASA Grade Assessment: III - A patient                            with severe systemic disease. After reviewing the  risks and benefits, the patient was deemed in                            satisfactory condition to undergo the procedure.                           After obtaining informed consent, the scope was                            passed under direct vision. Throughout the                            procedure, the patient's blood pressure, pulse, and                            oxygen saturations  were monitored continuously. The                            [id number] [phone]) scope was introduced through                            the mouth, and used to inject contrast into and                            used to locate the major papilla. The ERCP was                            technically difficult and complex due to extrinsic                            compression. Successful completion of the procedure                            was aided by performing the maneuvers documented                            (below) in this report.-we rolled the patient on                            his side and still with difficulty were able to                            finally pass the scope into the second portion of                            the duodenum The patient tolerated the procedure                            well. Scope In: Scope Out: Findings:      The major papilla was normal. initially we cannulated the CBDin the       customary fashion but could not advance the wire past the stricture and       then repositioning the sphincterotome we  lost access and in retrying to       cannulate A wire was passed into the ventral pancreatic duct. we left       the wire in place and then cannulated using the sphincterotome and the       smaller Jagwire and were able to advance into the intrahepatics and we       proceeded with a Biliary sphincterotomy was made with a Hydratome       sphincterotome using ERBE electrocautery. in the customary fashion There       was no post-sphincterotomy bleeding. there was a 4-5 cm distal stricture       and a dilated proximal CBD and intrahepatic and we were able to advance       the sphincterotome past the stricture and exchanged the smaller wire for       the 0.035 Jagwire which was removed from the pancreas at this point       Dilation of the common bile duct with a 4 cm 6 mm balloon dilator was       successful. One 10 Fr by 6 cm metal stent was placed into  the common       bile duct. Bile flowed through the stent. The stent was in good position. Impression:               - The major papilla appeared normal. a wire was                            left in the pancreas for a short time during the                            procedure with very minimal dye injected into the                            pancreas at 1 point                           - A biliary sphincterotomy was performed.                           - Common bile duct was successfully dilated.                           - One metal stent was placed into the common bile                            duct. Recommendation:           - Avoid aspirin and nonsteroidal anti-inflammatory                            medicines for 3 days.                           - Clear liquid diet today.                           - Continue present medications.                           -  Return to GI clinic PRN.                           - Telephone GI clinic if symptomatic PRN.                           - Check liver enzymes (AST, ALT, alkaline                            phosphatase, bilirubin) in 5 days. Procedure Code(s):        --- Professional ---                           [phone], Esophagogastroduodenoscopy, flexible,                            transoral; diagnostic, including collection of                            specimen(s) by brushing or washing, when performed                            (separate procedure) Diagnosis Code(s):        --- Professional ---                           R17, Unspecified jaundice                           K83.8, Other specified diseases of biliary tract CPT copyright 2016 American Medical Association. All rights reserved. The codes documented in this report are preliminary and upon coder review may  be revised to meet current compliance requirements. Clarene Essex, MD 03/29/2016 10:15:14 AM This report has been signed electronically. Number of Addenda: 0

## 2016-03-29 NOTE — Transfer of Care (Signed)
Immediate Anesthesia Transfer of Care Note  Patient: Reginald Ochoa  Procedure(s) Performed: Procedure(s): ENDOSCOPIC RETROGRADE CHOLANGIOPANCREATOGRAPHY (ERCP) (N/A)  Patient Location: PACU  Anesthesia Type:General  Level of Consciousness: awake, sedated and patient cooperative  Airway & Oxygen Therapy: Patient Spontanous Breathing and Patient connected to face mask oxygen  Post-op Assessment: Report given to RN, Post -op Vital signs reviewed and stable and Patient moving all extremities  Post vital signs: Reviewed and stable  Last Vitals:  Vitals:   03/29/16 0707 03/29/16 1015  BP: (!) 131/91 (!) 149/67  Pulse: (!) 124 68  Resp: 17 (!) 23  Temp: 36.8 C     Last Pain:  Vitals:   03/29/16 1015  TempSrc: Oral         Complications: No apparent anesthesia complications

## 2016-03-29 NOTE — Addendum Note (Signed)
Addendum  created 03/29/16 1132 by Lyn Hollingshead, MD   Sign clinical note

## 2016-03-29 NOTE — Discharge Instructions (Signed)
YOU HAD AN ENDOSCOPIC PROCEDURE TODAY: Refer to the procedure report and other information in the discharge instructions given to you for any specific questions about what was found during the examination. If this information does not answer your questions, please call [facility] office at [phone] to clarify.   YOU SHOULD EXPECT: Some feelings of bloating in the abdomen. Passage of more gas than usual. Walking can help get rid of the air that was put into your GI tract during the procedure and reduce the bloating. If you had a lower endoscopy (such as a colonoscopy or flexible sigmoidoscopy) you may notice spotting of blood in your stool or on the toilet paper. Some abdominal soreness may be present for a day or two, also.  DIET: Your first meal following the procedure should be a light meal and then it is ok to progress to your normal diet. A half-sandwich or bowl of soup is an example of a good first meal. Heavy or fried foods are harder to digest and may make you feel nauseous or bloated. Drink plenty of fluids but you should avoid alcoholic beverages for 24 hours. If you had a esophageal dilation, please see attached instructions for diet.   ACTIVITY: Your care partner should take you home directly after the procedure. You should plan to take it easy, moving slowly for the rest of the day. You can resume normal activity the day after the procedure however YOU SHOULD NOT DRIVE, use power tools, machinery or perform tasks that involve climbing or major physical exertion for 24 hours (because of the sedation medicines used during the test).   SYMPTOMS TO REPORT IMMEDIATELY: A gastroenterologist can be reached at any hour. Please call [phone]  for any of the following symptoms:  Following lower endoscopy (colonoscopy, flexible sigmoidoscopy) Excessive amounts of blood in the stool  Significant tenderness, worsening of abdominal pains  Swelling of the abdomen that is new, acute  Fever of 100 or  higher  Following upper endoscopy (EGD, EUS, ERCP, esophageal dilation) Vomiting of blood or coffee ground material  New, significant abdominal pain  New, significant chest pain or pain under the shoulder blades  Painful or persistently difficult swallowing  New shortness of breath  Black, tarry-looking or red, bloody stools  FOLLOW UP:  If any biopsies were taken you will be contacted by phone or by letter within the next 1-3 weeks. Call [phone]  if you have not heard about the biopsies in 3 weeks.  Please also call with any specific questions about appointments or follow up tests. Call if question or problem otherwise clear liquid diet until 6 PM and if doing well may have soft solids and follow-up with [place] as needed otherwise repeat labs in 5 days in our office unless you have and oncology appointment and they could check there

## 2016-03-29 NOTE — Anesthesia Procedure Notes (Signed)
Procedure Name: Intubation Date/Time: 03/29/2016 8:27 AM Performed by: Izora Gala Pre-anesthesia Checklist: Patient identified, Emergency Drugs available, Suction available and Patient being monitored Patient Re-evaluated:Patient Re-evaluated prior to inductionOxygen Delivery Method: Circle system utilized Preoxygenation: Pre-oxygenation with 100% oxygen Intubation Type: IV induction Ventilation: Mask ventilation without difficulty and Oral airway inserted - appropriate to patient size Laryngoscope Size: Miller and 3 Grade View: Grade I Tube type: Oral Tube size: 7.0 mm Number of attempts: 1 Airway Equipment and Method: Stylet and LTA kit utilized Placement Confirmation: ETT inserted through vocal cords under direct vision,  positive ETCO2 and breath sounds checked- equal and bilateral Secured at: 22 cm Tube secured with: Tape Dental Injury: Teeth and Oropharynx as per pre-operative assessment

## 2016-04-01 ENCOUNTER — Encounter (HOSPITAL_COMMUNITY): Payer: Self-pay | Admitting: Gastroenterology

## 2016-04-03 ENCOUNTER — Telehealth: Payer: Self-pay | Admitting: Hematology

## 2016-04-03 NOTE — Telephone Encounter (Signed)
Gave call to diabetic and nutrition per los to schedule appt. Correct referred to location on referral reason why referral did not go into the workque. Scheduler inform me that due to his type of insurance pt may have to pay out of pocket in order to be seen. Scheduler will call pt directly to make offer and schedule appt if pt agrees.

## 2016-04-08 ENCOUNTER — Telehealth: Payer: Self-pay | Admitting: *Deleted

## 2016-04-08 NOTE — Telephone Encounter (Signed)
Received call from pt's stepson, [name] stating they have been unable to find pt's creon over the holidays & asked for refill.  He has been off for several days.   Reviewed chart & pt should have another refill.  Informed [name] to check with pharmacy.

## 2016-04-09 ENCOUNTER — Telehealth: Payer: Self-pay | Admitting: *Deleted

## 2016-04-09 NOTE — Telephone Encounter (Signed)
Son left VM that patient looks ill and they will be bringing him to the hospital in next few minutes and asking for this office to expedite the admission. He is much weaker and having new pain in right lower abdomen. Navigator noted VM at 4:10 pm and called son back: dad is somewhat better now. Still in pain rated 3/10 and persistent nausea. Asking if he can be seen tomorrow in office. Informed him navigator will forward this message to Dr. Dow Adolph management. Reminded [name] that for symptom management or urgent issues he needs to call main # and ask for triage.

## 2016-04-09 NOTE — Telephone Encounter (Signed)
Called & spoke to [place] & questioned if pt has pain med & he has taken some ibuprofen & states the hydrocodone makes him loopy.  Also inquired if pt has fever & he reports none noted.  Informed to come in tomorrow 9 am for labs & 9:30 am for Selena Lesser NP but to go to ED if pt runs fever.  He expressed understanding.

## 2016-04-10 ENCOUNTER — Ambulatory Visit (HOSPITAL_BASED_OUTPATIENT_CLINIC_OR_DEPARTMENT_OTHER): Payer: Medicare Other | Admitting: Nurse Practitioner

## 2016-04-10 ENCOUNTER — Other Ambulatory Visit: Payer: Self-pay | Admitting: Nurse Practitioner

## 2016-04-10 ENCOUNTER — Other Ambulatory Visit: Payer: Self-pay | Admitting: Pharmacist

## 2016-04-10 ENCOUNTER — Ambulatory Visit (HOSPITAL_BASED_OUTPATIENT_CLINIC_OR_DEPARTMENT_OTHER): Payer: Medicare Other

## 2016-04-10 ENCOUNTER — Other Ambulatory Visit (HOSPITAL_BASED_OUTPATIENT_CLINIC_OR_DEPARTMENT_OTHER): Payer: Medicare Other

## 2016-04-10 ENCOUNTER — Encounter: Payer: Self-pay | Admitting: Nurse Practitioner

## 2016-04-10 VITALS — BP 137/88 | HR 54 | Temp 98.7°F | Resp 17 | Ht 66.0 in | Wt 120.4 lb

## 2016-04-10 DIAGNOSIS — C259 Malignant neoplasm of pancreas, unspecified: Secondary | ICD-10-CM

## 2016-04-10 DIAGNOSIS — C787 Secondary malignant neoplasm of liver and intrahepatic bile duct: Secondary | ICD-10-CM

## 2016-04-10 DIAGNOSIS — E860 Dehydration: Secondary | ICD-10-CM

## 2016-04-10 DIAGNOSIS — R5081 Fever presenting with conditions classified elsewhere: Secondary | ICD-10-CM

## 2016-04-10 DIAGNOSIS — R634 Abnormal weight loss: Secondary | ICD-10-CM | POA: Diagnosis not present

## 2016-04-10 DIAGNOSIS — R740 Nonspecific elevation of levels of transaminase and lactic acid dehydrogenase [LDH]: Secondary | ICD-10-CM

## 2016-04-10 DIAGNOSIS — M21619 Bunion of unspecified foot: Secondary | ICD-10-CM

## 2016-04-10 DIAGNOSIS — R7401 Elevation of levels of liver transaminase levels: Secondary | ICD-10-CM

## 2016-04-10 DIAGNOSIS — R509 Fever, unspecified: Secondary | ICD-10-CM | POA: Insufficient documentation

## 2016-04-10 LAB — URINALYSIS, MICROSCOPIC - CHCC
BILIRUBIN (URINE): UNDETERMINED
Blood: NEGATIVE
Glucose: 100 mg/dL
KETONES: NEGATIVE mg/dL
Leukocyte Esterase: NEGATIVE
Nitrite: NEGATIVE
Protein: <30 mg/dL
RBC / HPF: NEGATIVE (ref 0–2)
SPECIFIC GRAVITY, URINE: 1.025 (ref 1.003–1.035)
Urobilinogen, UR: 0.2 mg/dL (ref 0.2–1)
pH: 6 (ref 4.6–8.0)

## 2016-04-10 LAB — CBC WITH DIFFERENTIAL/PLATELET
BASO%: 0.6 % (ref 0.0–2.0)
Basophils Absolute: 0.1 10*3/uL (ref 0.0–0.1)
EOS ABS: 0.3 10*3/uL (ref 0.0–0.5)
EOS%: 3 % (ref 0.0–7.0)
HCT: 36.1 % — ABNORMAL LOW (ref 38.4–49.9)
HEMOGLOBIN: 11.8 g/dL — AB (ref 13.0–17.1)
LYMPH#: 1 10*3/uL (ref 0.9–3.3)
LYMPH%: 11 % — AB (ref 14.0–49.0)
MCH: 29.8 pg (ref 27.2–33.4)
MCHC: 32.7 g/dL (ref 32.0–36.0)
MCV: 91 fL (ref 79.3–98.0)
MONO#: 0.8 10*3/uL (ref 0.1–0.9)
MONO%: 8.4 % (ref 0.0–14.0)
NEUT%: 77 % — ABNORMAL HIGH (ref 39.0–75.0)
NEUTROS ABS: 7.3 10*3/uL — AB (ref 1.5–6.5)
PLATELETS: 372 10*3/uL (ref 140–400)
RBC: 3.96 10*6/uL — AB (ref 4.20–5.82)
RDW: 15.7 % — AB (ref 11.0–14.6)
WBC: 9.4 10*3/uL (ref 4.0–10.3)

## 2016-04-10 LAB — COMPREHENSIVE METABOLIC PANEL
ALBUMIN: 2.8 g/dL — AB (ref 3.5–5.0)
ALK PHOS: 891 U/L — AB (ref 40–150)
ALT: 193 U/L — AB (ref 0–55)
ANION GAP: 8 meq/L (ref 3–11)
AST: 107 U/L — ABNORMAL HIGH (ref 5–34)
BILIRUBIN TOTAL: 3.43 mg/dL — AB (ref 0.20–1.20)
BUN: 18 mg/dL (ref 7.0–26.0)
CO2: 30 meq/L — AB (ref 22–29)
CREATININE: 1.3 mg/dL (ref 0.7–1.3)
Calcium: 9.2 mg/dL (ref 8.4–10.4)
Chloride: 101 mEq/L (ref 98–109)
EGFR: 53 mL/min/{1.73_m2} — ABNORMAL LOW (ref >90)
GLUCOSE: 269 mg/dL — AB (ref 70–140)
Potassium: 3.9 mEq/L (ref 3.5–5.1)
SODIUM: 139 meq/L (ref 136–145)
TOTAL PROTEIN: 6.3 g/dL — AB (ref 6.4–8.3)

## 2016-04-10 MED ORDER — MORPHINE SULFATE (PF) 4 MG/ML IV SOLN
INTRAVENOUS | Status: AC
  Filled 2016-04-10: qty 1

## 2016-04-10 MED ORDER — DEXTROSE 5 % IV SOLN
1.0000 g | Freq: Once | INTRAVENOUS | Status: AC
  Administered 2016-04-10: 1 g via INTRAVENOUS
  Filled 2016-04-10: qty 10

## 2016-04-10 MED ORDER — SODIUM CHLORIDE 0.9 % IV SOLN
1000.0000 mL | Freq: Once | INTRAVENOUS | Status: AC
  Administered 2016-04-10: 1000 mL via INTRAVENOUS

## 2016-04-10 MED ORDER — MORPHINE SULFATE 4 MG/ML IJ SOLN
2.0000 mg | Freq: Once | INTRAMUSCULAR | Status: AC
Start: 2016-04-10 — End: 2016-04-10
  Administered 2016-04-10: 2 mg via INTRAVENOUS
  Filled 2016-04-10: qty 1

## 2016-04-10 NOTE — Assessment & Plan Note (Signed)
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  Briefly examined the bottom of his left foot and it does appear the patient has a large bunion that has been evaluated in the past per his primary care provider.  No evidence of active infection to the foot.

## 2016-04-10 NOTE — Assessment & Plan Note (Signed)
Patient received cycle 1, day 1 of his gemcitabine/Abraxane chemotherapy regimen on 03/15/2016.  He presented to the [facility] on 03/22/2016 for day 8 of his chemotherapy regimen; but the chemotherapy therapy was held due to the transaminitis issue.  Patient underwent a biliary stent placement on 03/29/2016.  See further notes for details of today's visit.  Patient is scheduled to return for labs, visit, and his next cycle of chemotherapy on 04/12/2016.

## 2016-04-10 NOTE — Progress Notes (Signed)
Pt to have CT Scan-abd/pelvis @ 7:30am tomorrow, 04/11/16. Reviewed instructions for exam with patient and stepson. Patient is to return to [facility] after scan for fluids and to have results of CT scan reviewed.  Both verbalize understanding.

## 2016-04-10 NOTE — Progress Notes (Signed)
SYMPTOM MANAGEMENT CLINIC    Chief Complaint: Fever, abdominal pain, dehydration  HPI:  Reginald Ochoa 75 y.o. male diagnosed with pancreatic cancer.  Currently, and when gemcitabine/Abraxane chemotherapy regimen.   Oncology History   Pancreatic cancer metastasized to liver [facility])   Staging form: Pancreas, AJCC 7th Edition   - Clinical stage from 03/01/2016: Stage IV (T2, N1, M1) - Signed by Truitt Merle, MD on 03/07/2016      Pancreatic cancer metastasized to liver [facility])   02/28/2016 - 03/01/2016 Hospital Admission    Pt presented with syncope and diarrhea. c-diff (-), he underwent liver biopsy and CT scans, and was discharged home.       03/01/2016 Initial Diagnosis    Pancreatic cancer metastasized to liver ([place])      03/01/2016 Initial Biopsy    Liver biopsy showed adenocarcinoma, features are consistent with metastatic adenocarcinoma      03/01/2016 Imaging    CT chest, abdomen and pelvis with contrast showed hyperenhancing mass in the head and uncinate process of the pancreas, with small nodular density anterior to the mass is concerning for cellulitis lesions, multiple hepatic hypodensities lesions most compatible with metastatic disease.      03/01/2016 Tumor Marker    CEA 10.5, CA19.9 2        Review of Systems  Constitutional: Positive for chills, fever, malaise/fatigue and weight loss.  Gastrointestinal: Positive for abdominal pain and nausea.  Neurological: Positive for weakness.  All other systems reviewed and are negative.   Past Medical History:  Diagnosis Date  . Alcohol abuse   . Asthma   . Cancer [facility])    pancreatic with liver lesions  . Complication of anesthesia    PMH: hard to put to sleep  . Depression   . Depression with anxiety   . GERD (gastroesophageal reflux disease)   . GIB (gastrointestinal bleeding)   . Headache   . Heartburn   . Hypertension   . Neuromuscular disorder ([place])    pinched nerve  . Pancreatic mass   .  Pneumonia   . TIA (transient ischemic attack)   . Tobacco abuse     Past Surgical History:  Procedure Laterality Date  . bleeding ulcer    . CATARACT EXTRACTION    . COLONOSCOPY    . ERCP N/A 03/29/2016   Procedure: ENDOSCOPIC RETROGRADE CHOLANGIOPANCREATOGRAPHY (ERCP);  Surgeon: Clarene Essex, MD;  Location: [facility] ENDOSCOPY;  Service: Endoscopy;  Laterality: N/A;  . MULTIPLE TOOTH EXTRACTIONS    . TONSILLECTOMY      has Hypertension; Asthma exacerbation; Tobacco abuse; Depression with anxiety; Fall; Syncope; AKI (acute kidney injury) ([place]); Alcohol abuse; Tachycardia; Abnormal EKG; Pancreatic cancer metastasized to liver [facility]); Fever; Unintentional weight loss; Transaminitis; Dehydration; and Bunion on his problem list.    has No Known Allergies.    Medication List       Accurate as of 04/10/16  1:37 PM. Always use your most recent med list.          albuterol 108 (90 Base) MCG/ACT inhaler Commonly known as:  PROVENTIL HFA;VENTOLIN HFA Inhale 2 puffs into the lungs every 6 (six) hours as needed for wheezing or shortness of breath.   ALPRAZolam 1 MG tablet Commonly known as:  XANAX Take 0.5-1 mg by mouth 2 (two) times daily as needed for anxiety. Anxiety   amLODipine 2.5 MG tablet Commonly known as:  NORVASC Take 1 tablet (2.5 mg total) by mouth daily.   amphetamine-dextroamphetamine 10 MG tablet Commonly  known as:  ADDERALL Take 5-10 mg by mouth 2 (two) times daily. '10mg'$  in the morning and '5mg'$  at lunch.   aspirin 325 MG tablet Take 325 mg by mouth every 6 (six) hours as needed for headache.   budesonide-formoterol 160-4.5 MCG/ACT inhaler Commonly known as:  SYMBICORT Inhale 2 puffs into the lungs 2 (two) times daily.   cetirizine 10 MG tablet Commonly known as:  ZYRTEC Take 10 mg by mouth at bedtime.   FLUoxetine 40 MG capsule Commonly known as:  PROZAC Take 40 mg by mouth daily with breakfast.   guaiFENesin 600 MG 12 hr tablet Commonly known as:  MUCINEX Take  1 tablet (600 mg total) by mouth 2 (two) times daily.   HYDROcodone-acetaminophen 10-325 MG tablet Commonly known as:  NORCO Take 1 tablet by mouth daily as needed for moderate pain.   LUTEIN PO Take 1 tablet by mouth daily.   multivitamin with minerals tablet Take 1 tablet by mouth daily.   ondansetron 8 MG disintegrating tablet Commonly known as:  ZOFRAN ODT Take 1 tablet (8 mg total) by mouth every 8 (eight) hours as needed for nausea or vomiting.   ondansetron 8 MG tablet Commonly known as:  ZOFRAN Take 1 tablet (8 mg total) by mouth 2 (two) times daily as needed (Nausea or vomiting).   OVER THE COUNTER MEDICATION Place 2 sprays into both nostrils as needed (for congestion). [employer] Nasal Spray for congestion   Pancrelipase (Lip-Prot-Amyl) 24000-76000 units Cpep Commonly known as:  CREON Take 1 capsule (24,000 Units total) by mouth 3 (three) times daily with meals.   prochlorperazine 10 MG tablet Commonly known as:  COMPAZINE Take 1 tablet (10 mg total) by mouth every 6 (six) hours as needed (Nausea or vomiting).   senna-docusate 8.6-50 MG tablet Commonly known as:  Senokot-S Take 1 tablet by mouth at bedtime as needed for mild constipation.        PHYSICAL EXAMINATION  Oncology Vitals 04/10/2016 03/29/2016  Height 168 cm -  Weight 54.613 kg -  Weight (lbs) 120 lbs 6 oz -  BMI (kg/m2) 19.43 kg/m2 -  Temp 99.6 -  Pulse 99 92  Resp 16 17  SpO2 100 98  BSA (m2) 1.59 m2 -   BP Readings from Last 2 Encounters:  04/10/16 131/88  03/29/16 (!) 102/91    Physical Exam  Constitutional: He is oriented to person, place, and time. He appears malnourished and dehydrated. He appears unhealthy. He appears cachectic.  HENT:  Head: Normocephalic and atraumatic.  Mouth/Throat: Oropharynx is clear and moist.  Eyes: Conjunctivae and EOM are normal. Pupils are equal, round, and reactive to light. Right eye exhibits no discharge. Left eye exhibits no discharge. No scleral  icterus.  Neck: Normal range of motion. Neck supple. No JVD present. No tracheal deviation present. No thyromegaly present.  Cardiovascular: Normal rate, regular rhythm, normal heart sounds and intact distal pulses.   Pulmonary/Chest: Effort normal and breath sounds normal. No respiratory distress. He has no wheezes. He has no rales. He exhibits no tenderness.  Abdominal: Soft. Bowel sounds are normal. He exhibits no distension and no mass. There is tenderness. There is no rebound and no guarding.  Musculoskeletal: Normal range of motion. He exhibits no edema, tenderness or deformity.  Lymphadenopathy:    He has no cervical adenopathy.  Neurological: He is alert and oriented to person, place, and time.  Skin: Skin is warm and dry. No rash noted. No erythema. There is pallor.  Psychiatric:  Affect normal.  Nursing note and vitals reviewed.   LABORATORY DATA:. Appointment on 04/10/2016  Component Date Value Ref Range Status  . Glucose 04/10/2016 100  Negative mg/dL Final  . Bilirubin (Urine) 04/10/2016 Unable to determine  Negative Final  . Ketones 04/10/2016 Negative  Negative mg/dL Final  . Specific Gravity, Urine 04/10/2016 1.025  1.003 - 1.035 Final  . Blood 04/10/2016 Negative  Negative Final  . pH 04/10/2016 6.0  4.6 - 8.0 Final  . Protein 04/10/2016 < 30  Negative- <30 mg/dL Final  . Urobilinogen, UR 04/10/2016 0.2  0.2 - 1 mg/dL Final  . Nitrite 04/10/2016 Negative  Negative Final  . Leukocyte Esterase 04/10/2016 Negative  Negative Final  . RBC / HPF 04/10/2016 Negative  0 - 2 Final  . WBC, UA 04/10/2016 0-2  0 - 2 Final  . Bacteria, UA 04/10/2016 Few  Negative- Trace Final  . Casts 04/10/2016 Occ Hyaline  Negative Final  . Crystals 04/10/2016 Many Ca Oxalate  Negative Final  . Epithelial Cells 04/10/2016 Occasional  Negative- Few Final  Appointment on 04/10/2016  Component Date Value Ref Range Status  . WBC 04/10/2016 9.4  4.0 - 10.3 10e3/uL Final  . NEUT# 04/10/2016 7.3*  1.5 - 6.5 10e3/uL Final  . HGB 04/10/2016 11.8* 13.0 - 17.1 g/dL Final  . HCT 04/10/2016 36.1* 38.4 - 49.9 % Final  . Platelets 04/10/2016 372  140 - 400 10e3/uL Final  . MCV 04/10/2016 91.0  79.3 - 98.0 fL Final  . MCH 04/10/2016 29.8  27.2 - 33.4 pg Final  . MCHC 04/10/2016 32.7  32.0 - 36.0 g/dL Final  . RBC 04/10/2016 3.96* 4.20 - 5.82 10e6/uL Final  . RDW 04/10/2016 15.7* 11.0 - 14.6 % Final  . lymph# 04/10/2016 1.0  0.9 - 3.3 10e3/uL Final  . MONO# 04/10/2016 0.8  0.1 - 0.9 10e3/uL Final  . Eosinophils Absolute 04/10/2016 0.3  0.0 - 0.5 10e3/uL Final  . Basophils Absolute 04/10/2016 0.1  0.0 - 0.1 10e3/uL Final  . NEUT% 04/10/2016 77.0* 39.0 - 75.0 % Final  . LYMPH% 04/10/2016 11.0* 14.0 - 49.0 % Final  . MONO% 04/10/2016 8.4  0.0 - 14.0 % Final  . EOS% 04/10/2016 3.0  0.0 - 7.0 % Final  . BASO% 04/10/2016 0.6  0.0 - 2.0 % Final  . Sodium 04/10/2016 139  136 - 145 mEq/L Final  . Potassium 04/10/2016 3.9  3.5 - 5.1 mEq/L Final  . Chloride 04/10/2016 101  98 - 109 mEq/L Final  . CO2 04/10/2016 30* 22 - 29 mEq/L Final  . Glucose 04/10/2016 269* 70 - 140 mg/dl Final  . BUN 04/10/2016 18.0  7.0 - 26.0 mg/dL Final  . Creatinine 04/10/2016 1.3  0.7 - 1.3 mg/dL Final  . Total Bilirubin 04/10/2016 3.43* 0.20 - 1.20 mg/dL Final  . Alkaline Phosphatase 04/10/2016 891* 40 - 150 U/L Final  . AST 04/10/2016 107* 5 - 34 U/L Final  . ALT 04/10/2016 193* 0 - 55 U/L Final  . Total Protein 04/10/2016 6.3* 6.4 - 8.3 g/dL Final  . Albumin 04/10/2016 2.8* 3.5 - 5.0 g/dL Final  . Calcium 04/10/2016 9.2  8.4 - 10.4 mg/dL Final  . Anion Gap 04/10/2016 8  3 - 11 mEq/L Final  . EGFR 04/10/2016 53* >90 ml/min/1.73 m2 Final    RADIOGRAPHIC STUDIES: No results found.  ASSESSMENT/PLAN:    Unintentional weight loss Patient has lost approximately 10 pounds since his last weight check.  He has minimal  appetite and only take sips of water.  He appears dehydrated today and will receive IV fluid  rehydration.  Patient was encouraged to push fluids and to increase protein in his diet is much as possible.  Transaminitis Patient had a biliary stent placed on November 17.  He presented to the [facility] today with complaint of right upper quadrant and right lower quadrant pain; as well as a fever to a maximum of 99.6 only.  He denies any vomiting; but feels that he has been nauseous at times.  He states his bowels are moving regularly.  On exam today there is mild to moderate tenderness to the entire right side of his abdomen with palpation.  There was no flank pain.  Patient appears on well; and is mildly jaundiced.  Also, is noted.  The sclerae are jaundiced as well.  Vital signs today reveal heart rate 99, and temperature 99.6.  Labs obtained today revealed that AST is actually increased from 93 up to 107 and ALT has increased from 166-193.  The liver enzymes are actually higher now been just prior to biliary stent placement.  Also, bilirubin remains elevated at 3.3 today.  Prior to biliary stent placement bilirubin was 7.1.  Also, alkaline phosphatase has increased from 524 up to 891.  Reviewed all findings with Dr. Burr Medico; and attempted to obtain a stat CT with contrast of the abdomen and pelvis this afternoon for further evaluation.  However, was unable to obtain the CT until first thing tomorrow morning at 7:30 AM.  Also contemplated transporting patient to the emergency department to have the CT obtained; but there would also be an extended wait for the CT if patient was taken to the emergency department.  In the meantime-patient will receive IV fluid rehydration, and Rocephin 1 g IV prophylactically.  Confirmed that urinalysis, urine culture, and blood cultures 2 have already been obtained prior to initiation of the antibiotics.  The plan is for the patient to begin drinking the first bottle of contrast at 5:30 AM tomorrow morning, the second bottle of contrast at 6:30 AM tomorrow  morning to my: And then present for the CT with contrast of the abdomen/pelvis to tomorrow morning the radiology department to [facility] long hospital at 7:30 AM.  He will then return to the [place] for additional IV fluid rehydration and a recheck while awaiting CT results.  Patient was also informed that he needed to stay nothing by mouth for approximate 4 hours prior to the CT scan.  Advised both patient and his son that they should keep the patient nothing by mouth after the CT scan as well until further notice.  Patient was advised to go directly to the emergency department overnight.  If he develops any worsening symptoms whatsoever.  Pancreatic cancer metastasized to liver [facility]) Patient received cycle 1, day 1 of his gemcitabine/Abraxane chemotherapy regimen on 03/15/2016.  He presented to the [place] on 03/22/2016 for day 8 of his chemotherapy regimen; but the chemotherapy therapy was held due to the transaminitis issue.  Patient underwent a biliary stent placement on 03/29/2016.  See further notes for details of today's visit.  Patient is scheduled to return for labs, visit, and his next cycle of chemotherapy on 04/12/2016.  Fever Patient had a biliary stent placed on November 17.  He presented to the [place] today with complaint of right upper quadrant and right lower quadrant pain; as well as a fever to a maximum of 99.6 only.  He denies any vomiting; but feels that he has been nauseous at times.  He states his bowels are moving regularly.  On exam today there is mild to moderate tenderness to the entire right side of his abdomen with palpation.  There was no flank pain.  Patient appears on well; and is mildly jaundiced.  Also, is noted.  The sclerae are jaundiced as well.  Vital signs today reveal heart rate 99, and temperature 99.6.  Labs obtained today revealed that AST is actually increased from 93 up to 107 and ALT has increased from 166-193.  The liver enzymes are  actually higher now been just prior to biliary stent placement.  Also, bilirubin remains elevated at 3.3 today.  Prior to biliary stent placement bilirubin was 7.1.  Also, alkaline phosphatase has increased from 524 up to 891.  Reviewed all findings with Dr. Mosetta Putt; and attempted to obtain a stat CT with contrast of the abdomen and pelvis this afternoon for further evaluation.  However, was unable to obtain the CT until first thing tomorrow morning at 7:30 AM.  Also contemplated transporting patient to the emergency department to have the CT obtained; but there would also be an extended wait for the CT if patient was taken to the emergency department.  In the meantime-patient will receive IV fluid rehydration, and Rocephin 1 g IV prophylactically.  Confirmed that urinalysis, urine culture, and blood cultures 2 have already been obtained prior to initiation of the antibiotics.  Urinalysis obtained today was negative for UTI.  The plan is for the patient to begin drinking the first bottle of contrast at 5:30 AM tomorrow morning, the second bottle of contrast at 6:30 AM tomorrow morning to my: And then present for the CT with contrast of the abdomen/pelvis to tomorrow morning the radiology department to [facility] long hospital at 7:30 AM.  He will then return to the cancer Center for additional IV fluid rehydration and a recheck while awaiting CT results.  Patient was also informed that he needed to stay nothing by mouth for approximate 4 hours prior to the CT scan.  Advised both patient and his son that they should keep the patient nothing by mouth after the CT scan as well until further notice.  Patient was advised to go directly to the emergency department overnight.  If he develops any worsening symptoms whatsoever.  Dehydration Patient received IV fluid rehydration while at the cancer Center today.  He also received morphine 2 mg IV as well.  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  Briefly examined the bottom of his left foot and it does appear the patient has a large bunion that has been evaluated in the past per his primary care provider.  No evidence of active infection to the foot.   Patient stated understanding of all instructions; and was in agreement with this plan of care. The patient knows to call the clinic with any problems, questions or concerns.   Total time spent with patient was 40  minutes;  with greater than 75 percent of that time spent in face to face counseling regarding patient's symptoms,  and coordination of care and follow up.  Disclaimer:This dictation was prepared with Dragon/digital dictation along with [employer]. Any transcriptional errors that result from this process are unintentional.  Payton Mccallum, NP 04/10/2016   Addendum  I have seen the patient, examined him. I agree with the assessment and and plan  and have edited the notes.   Will obtain CT scan tomorrow for further evaluation of his cancer progression, vs sings of infection/abscess. Will initiate iv antibiotics in our clinic today due to the concern of abdominal infection. He will return tomorrow morning for CT and followup.   Truitt Merle  04/12/2016

## 2016-04-10 NOTE — Assessment & Plan Note (Addendum)
Patient had a biliary stent placed on November 17.  He presented to the [place] today with complaint of right upper quadrant and right lower quadrant pain; as well as a fever to a maximum of 99.6 only.  He denies any vomiting; but feels that he has been nauseous at times.  He states his bowels are moving regularly.  On exam today there is mild to moderate tenderness to the entire right side of his abdomen with palpation.  There was no flank pain.  Patient appears on well; and is mildly jaundiced.  Also, is noted.  The sclerae are jaundiced as well.  Vital signs today reveal heart rate 99, and temperature 99.6.  Labs obtained today revealed that AST is actually increased from 93 up to 107 and ALT has increased from 166-193.  The liver enzymes are actually higher now been just prior to biliary stent placement.  Also, bilirubin remains elevated at 3.3 today.  Prior to biliary stent placement bilirubin was 7.1.  Also, alkaline phosphatase has increased from 524 up to 891.  Reviewed all findings with Dr. Burr Medico; and attempted to obtain a stat CT with contrast of the abdomen and pelvis this afternoon for further evaluation.  However, was unable to obtain the CT until first thing tomorrow morning at 7:30 AM.  Also contemplated transporting patient to the emergency department to have the CT obtained; but there would also be an extended wait for the CT if patient was taken to the emergency department.  In the meantime-patient will receive IV fluid rehydration, and Rocephin 1 g IV prophylactically.  Confirmed that urinalysis, urine culture, and blood cultures 2 have already been obtained prior to initiation of the antibiotics.  Urinalysis obtained today was negative for UTI.  The plan is for the patient to begin drinking the first bottle of contrast at 5:30 AM tomorrow morning, the second bottle of contrast at 6:30 AM tomorrow morning to my: And then present for the CT with contrast of the abdomen/pelvis to  tomorrow morning the radiology department to [facility] hospital at 7:30 AM.  He will then return to the [place] for additional IV fluid rehydration and a recheck while awaiting CT results.  Patient was also informed that he needed to stay nothing by mouth for approximate 4 hours prior to the CT scan.  Advised both patient and his son that they should keep the patient nothing by mouth after the CT scan as well until further notice.  Patient was advised to go directly to the emergency department overnight.  If he develops any worsening symptoms whatsoever.

## 2016-04-10 NOTE — Assessment & Plan Note (Signed)
Patient received IV fluid rehydration while at the cancer Center today.  He also received morphine 2 mg IV as well.

## 2016-04-10 NOTE — Assessment & Plan Note (Signed)
Patient has lost approximately 10 pounds since his last weight check.  He has minimal appetite and only take sips of water.  He appears dehydrated today and will receive IV fluid rehydration.  Patient was encouraged to push fluids and to increase protein in his diet is much as possible.

## 2016-04-10 NOTE — Assessment & Plan Note (Signed)
Patient had a biliary stent placed on November 17.  He presented to the [facility] today with complaint of right upper quadrant and right lower quadrant pain; as well as a fever to a maximum of 99.6 only.  He denies any vomiting; but feels that he has been nauseous at times.  He states his bowels are moving regularly.  On exam today there is mild to moderate tenderness to the entire right side of his abdomen with palpation.  There was no flank pain.  Patient appears on well; and is mildly jaundiced.  Also, is noted.  The sclerae are jaundiced as well.  Vital signs today reveal heart rate 99, and temperature 99.6.  Labs obtained today revealed that AST is actually increased from 93 up to 107 and ALT has increased from 166-193.  The liver enzymes are actually higher now been just prior to biliary stent placement.  Also, bilirubin remains elevated at 3.3 today.  Prior to biliary stent placement bilirubin was 7.1.  Also, alkaline phosphatase has increased from 524 up to 891.  Reviewed all findings with Dr. Burr Medico; and attempted to obtain a stat CT with contrast of the abdomen and pelvis this afternoon for further evaluation.  However, was unable to obtain the CT until first thing tomorrow morning at 7:30 AM.  Also contemplated transporting patient to the emergency department to have the CT obtained; but there would also be an extended wait for the CT if patient was taken to the emergency department.  In the meantime-patient will receive IV fluid rehydration, and Rocephin 1 g IV prophylactically.  Confirmed that urinalysis, urine culture, and blood cultures 2 have already been obtained prior to initiation of the antibiotics.  The plan is for the patient to begin drinking the first bottle of contrast at 5:30 AM tomorrow morning, the second bottle of contrast at 6:30 AM tomorrow morning to my: And then present for the CT with contrast of the abdomen/pelvis to tomorrow morning the radiology department to  [facility] hospital at 7:30 AM.  He will then return to the [place] for additional IV fluid rehydration and a recheck while awaiting CT results.  Patient was also informed that he needed to stay nothing by mouth for approximate 4 hours prior to the CT scan.  Advised both patient and his son that they should keep the patient nothing by mouth after the CT scan as well until further notice.  Patient was advised to go directly to the emergency department overnight.  If he develops any worsening symptoms whatsoever.

## 2016-04-10 NOTE — Patient Instructions (Signed)
Dehydration, Adult Dehydration is a condition in which there is not enough fluid or water in the body. This happens when you lose more fluids than you take in. Important organs, such as the kidneys, brain, and heart, cannot function without a proper amount of fluids. Any loss of fluids from the body can lead to dehydration. Dehydration can range from mild to severe. This condition should be treated right away to prevent it from becoming severe. What are the causes? This condition may be caused by:  Vomiting.  Diarrhea.  Excessive sweating, such as from heat exposure or exercise.  Not drinking enough fluid, especially:  When ill.  While doing activity that requires a lot of energy.  Excessive urination.  Fever.  Infection.  Certain medicines, such as medicines that cause the body to lose excess fluid (diuretics).  Inability to access safe drinking water.  Reduced physical ability to get adequate water and food. What increases the risk? This condition is more likely to develop in people:  Who have a poorly controlled long-term (chronic) illness, such as diabetes, heart disease, or kidney disease.  Who are age 65 or older.  Who are disabled.  Who live in a place with high altitude.  Who play endurance sports. What are the signs or symptoms? Symptoms of mild dehydration may include:   Thirst.  Dry lips.  Slightly dry mouth.  Dry, warm skin.  Dizziness. Symptoms of moderate dehydration may include:   Very dry mouth.  Muscle cramps.  Dark urine. Urine may be the color of tea.  Decreased urine production.  Decreased tear production.  Heartbeat that is irregular or faster than normal (palpitations).  Headache.  Light-headedness, especially when you stand up from a sitting position.  Fainting (syncope). Symptoms of severe dehydration may include:   Changes in skin, such as:  Cold and clammy skin.  Blotchy (mottled) or pale skin.  Skin that does  not quickly return to normal after being lightly pinched and released (poor skin turgor).  Changes in body fluids, such as:  Extreme thirst.  No tear production.  Inability to sweat when body temperature is high, such as in hot weather.  Very little urine production.  Changes in vital signs, such as:  Weak pulse.  Pulse that is more than 100 beats a minute when sitting still.  Rapid breathing.  Low blood pressure.  Other changes, such as:  Sunken eyes.  Cold hands and feet.  Confusion.  Lack of energy (lethargy).  Difficulty waking up from sleep.  Short-term weight loss.  Unconsciousness. How is this diagnosed? This condition is diagnosed based on your symptoms and a physical exam. Blood and urine tests may be done to help confirm the diagnosis. How is this treated? Treatment for this condition depends on the severity. Mild or moderate dehydration can often be treated at home. Treatment should be started right away. Do not wait until dehydration becomes severe. Severe dehydration is an emergency and it needs to be treated in a hospital. Treatment for mild dehydration may include:   Drinking more fluids.  Replacing salts and minerals in your blood (electrolytes) that you may have lost. Treatment for moderate dehydration may include:   Drinking an oral rehydration solution (ORS). This is a drink that helps you replace fluids and electrolytes (rehydrate). It can be found at pharmacies and retail stores. Treatment for severe dehydration may include:   Receiving fluids through an IV tube.  Receiving an electrolyte solution through a feeding tube that is   passed through your nose and into your stomach (nasogastric tube, or NG tube).  Correcting any abnormalities in electrolytes.  Treating the underlying cause of dehydration. Follow these instructions at home:  If directed by your health care provider, drink an ORS:  Make an ORS by following instructions on the  package.  Start by drinking small amounts, about  cup (120 mL) every 5-10 minutes.  Slowly increase how much you drink until you have taken the amount recommended by your health care provider.  Drink enough clear fluid to keep your urine clear or pale yellow. If you were told to drink an ORS, finish the ORS first, then start slowly drinking other clear fluids. Drink fluids such as:  Water. Do not drink only water. Doing that can lead to having too little salt (sodium) in the body (hyponatremia).  Ice chips.  Fruit juice that you have added water to (diluted fruit juice).  Low-calorie sports drinks.  Avoid:  Alcohol.  Drinks that contain a lot of sugar. These include high-calorie sports drinks, fruit juice that is not diluted, and soda.  Caffeine.  Foods that are greasy or contain a lot of fat or sugar.  Take over-the-counter and prescription medicines only as told by your health care provider.  Do not take sodium tablets. This can lead to having too much sodium in the body (hypernatremia).  Eat foods that contain a healthy balance of electrolytes, such as bananas, oranges, potatoes, tomatoes, and spinach.  Keep all follow-up visits as told by your health care provider. This is important. Contact a health care provider if:  You have abdominal pain that:  Gets worse.  Stays in one area (localizes).  You have a rash.  You have a stiff neck.  You are more irritable than usual.  You are sleepier or more difficult to wake up than usual.  You feel weak or dizzy.  You feel very thirsty.  You have urinated only a small amount of very dark urine over 6-8 hours. Get help right away if:  You have symptoms of severe dehydration.  You cannot drink fluids without vomiting.  Your symptoms get worse with treatment.  You have a fever.  You have a severe headache.  You have vomiting or diarrhea that:  Gets worse.  Does not go away.  You have blood or green matter  (bile) in your vomit.  You have blood in your stool. This may cause stool to look black and tarry.  You have not urinated in 6-8 hours.  You faint.  Your heart rate while sitting still is over 100 beats a minute.  You have trouble breathing. This information is not intended to replace advice given to you by your health care provider. Make sure you discuss any questions you have with your health care provider. Document Released: 04/29/2005 Document Revised: 11/24/2015 Document Reviewed: 06/23/2015 Elsevier Interactive Patient Education  2017 [employer]  

## 2016-04-11 ENCOUNTER — Encounter: Payer: Self-pay | Admitting: Nurse Practitioner

## 2016-04-11 ENCOUNTER — Ambulatory Visit (HOSPITAL_BASED_OUTPATIENT_CLINIC_OR_DEPARTMENT_OTHER): Payer: Medicare Other | Admitting: Nurse Practitioner

## 2016-04-11 ENCOUNTER — Ambulatory Visit (HOSPITAL_COMMUNITY)
Admission: RE | Admit: 2016-04-11 | Discharge: 2016-04-11 | Disposition: A | Discharge status: Home / Self Care | Payer: Medicare Other | Source: Ambulatory Visit | Attending: Nurse Practitioner | Admitting: Nurse Practitioner

## 2016-04-11 ENCOUNTER — Encounter (HOSPITAL_COMMUNITY): Payer: Self-pay

## 2016-04-11 ENCOUNTER — Inpatient Hospital Stay (HOSPITAL_COMMUNITY)
Admission: AD | Admit: 2016-04-11 | Discharge: 2016-04-13 | DRG: 435 | Disposition: A | Discharge status: Home / Self Care | Payer: Medicare Other | Source: Ambulatory Visit | Attending: Internal Medicine | Admitting: Internal Medicine

## 2016-04-11 VITALS — BP 152/98 | HR 111 | Temp 99.0°F | Resp 17 | Ht 66.0 in | Wt 124.1 lb

## 2016-04-11 DIAGNOSIS — F17290 Nicotine dependence, other tobacco product, uncomplicated: Secondary | ICD-10-CM | POA: Diagnosis present

## 2016-04-11 DIAGNOSIS — R748 Abnormal levels of other serum enzymes: Secondary | ICD-10-CM | POA: Diagnosis present

## 2016-04-11 DIAGNOSIS — C250 Malignant neoplasm of head of pancreas: Secondary | ICD-10-CM

## 2016-04-11 DIAGNOSIS — F419 Anxiety disorder, unspecified: Secondary | ICD-10-CM | POA: Diagnosis present

## 2016-04-11 DIAGNOSIS — C259 Malignant neoplasm of pancreas, unspecified: Secondary | ICD-10-CM | POA: Diagnosis not present

## 2016-04-11 DIAGNOSIS — R509 Fever, unspecified: Secondary | ICD-10-CM

## 2016-04-11 DIAGNOSIS — R7401 Elevation of levels of liver transaminase levels: Secondary | ICD-10-CM | POA: Diagnosis present

## 2016-04-11 DIAGNOSIS — C787 Secondary malignant neoplasm of liver and intrahepatic bile duct: Secondary | ICD-10-CM | POA: Diagnosis present

## 2016-04-11 DIAGNOSIS — E860 Dehydration: Secondary | ICD-10-CM

## 2016-04-11 DIAGNOSIS — R634 Abnormal weight loss: Secondary | ICD-10-CM | POA: Diagnosis present

## 2016-04-11 DIAGNOSIS — K831 Obstruction of bile duct: Secondary | ICD-10-CM | POA: Diagnosis present

## 2016-04-11 DIAGNOSIS — F17210 Nicotine dependence, cigarettes, uncomplicated: Secondary | ICD-10-CM | POA: Diagnosis present

## 2016-04-11 DIAGNOSIS — R1011 Right upper quadrant pain: Secondary | ICD-10-CM | POA: Diagnosis not present

## 2016-04-11 DIAGNOSIS — Z79899 Other long term (current) drug therapy: Secondary | ICD-10-CM

## 2016-04-11 DIAGNOSIS — K5900 Constipation, unspecified: Secondary | ICD-10-CM | POA: Diagnosis not present

## 2016-04-11 DIAGNOSIS — Z8673 Personal history of transient ischemic attack (TIA), and cerebral infarction without residual deficits: Secondary | ICD-10-CM

## 2016-04-11 DIAGNOSIS — R740 Nonspecific elevation of levels of transaminase and lactic acid dehydrogenase [LDH]: Secondary | ICD-10-CM | POA: Diagnosis present

## 2016-04-11 DIAGNOSIS — I10 Essential (primary) hypertension: Secondary | ICD-10-CM | POA: Diagnosis not present

## 2016-04-11 DIAGNOSIS — R109 Unspecified abdominal pain: Secondary | ICD-10-CM | POA: Diagnosis present

## 2016-04-11 DIAGNOSIS — Z7952 Long term (current) use of systemic steroids: Secondary | ICD-10-CM

## 2016-04-11 DIAGNOSIS — E43 Unspecified severe protein-calorie malnutrition: Secondary | ICD-10-CM | POA: Diagnosis present

## 2016-04-11 DIAGNOSIS — J45909 Unspecified asthma, uncomplicated: Secondary | ICD-10-CM | POA: Diagnosis present

## 2016-04-11 DIAGNOSIS — Z681 Body mass index (BMI) 19 or less, adult: Secondary | ICD-10-CM

## 2016-04-11 DIAGNOSIS — F329 Major depressive disorder, single episode, unspecified: Secondary | ICD-10-CM | POA: Diagnosis present

## 2016-04-11 LAB — URINE CULTURE: Organism ID, Bacteria: NO GROWTH

## 2016-04-11 LAB — CANCER ANTIGEN 19-9: CAN 19-9: 7 U/mL (ref 0–35)

## 2016-04-11 MED ORDER — ENOXAPARIN SODIUM 40 MG/0.4ML ~~LOC~~ SOLN
40.0000 mg | SUBCUTANEOUS | Status: DC
  Administered 2016-04-11 – 2016-04-12 (×2): 40 mg via SUBCUTANEOUS
  Filled 2016-04-11 (×2): qty 0.4

## 2016-04-11 MED ORDER — ALBUTEROL SULFATE (2.5 MG/3ML) 0.083% IN NEBU: 3.0000 mL | INHALATION_SOLUTION | Freq: Four times a day (QID) | RESPIRATORY_TRACT | Status: DC | PRN

## 2016-04-11 MED ORDER — FLUOXETINE HCL 20 MG PO CAPS
40.0000 mg | ORAL_CAPSULE | Freq: Every day | ORAL | Status: DC
  Administered 2016-04-12 – 2016-04-13 (×2): 40 mg via ORAL
  Filled 2016-04-11 (×2): qty 2

## 2016-04-11 MED ORDER — LORATADINE 10 MG PO TABS
10.0000 mg | ORAL_TABLET | Freq: Every day | ORAL | Status: DC
  Administered 2016-04-11 – 2016-04-12 (×2): 10 mg via ORAL
  Filled 2016-04-11 (×2): qty 1

## 2016-04-11 MED ORDER — ALPRAZOLAM 0.5 MG PO TABS
0.5000 mg | ORAL_TABLET | Freq: Two times a day (BID) | ORAL | Status: DC | PRN
  Administered 2016-04-12: 0.5 mg via ORAL
  Administered 2016-04-12 (×2): 1 mg via ORAL
  Filled 2016-04-11: qty 2
  Filled 2016-04-11: qty 1
  Filled 2016-04-11: qty 2

## 2016-04-11 MED ORDER — HYDROCODONE-ACETAMINOPHEN 10-325 MG PO TABS
1.0000 | ORAL_TABLET | Freq: Every day | ORAL | Status: DC | PRN
  Administered 2016-04-12 (×2): 1 via ORAL
  Filled 2016-04-11 (×2): qty 1

## 2016-04-11 MED ORDER — IOPAMIDOL (ISOVUE-300) INJECTION 61%
INTRAVENOUS | Status: AC
  Filled 2016-04-11: qty 100

## 2016-04-11 MED ORDER — AMPHETAMINE-DEXTROAMPHETAMINE 5 MG PO TABS: 5.0000 mg | ORAL_TABLET | Freq: Every day | ORAL | Status: DC

## 2016-04-11 MED ORDER — IRBESARTAN 150 MG PO TABS
75.0000 mg | ORAL_TABLET | Freq: Every day | ORAL | Status: DC
  Administered 2016-04-11 – 2016-04-13 (×3): 75 mg via ORAL
  Filled 2016-04-11 (×3): qty 1

## 2016-04-11 MED ORDER — CEFTRIAXONE SODIUM 2 G IJ SOLR
2.0000 g | INTRAMUSCULAR | Status: DC
  Administered 2016-04-11 – 2016-04-13 (×3): 2 g via INTRAVENOUS
  Filled 2016-04-11 (×4): qty 2

## 2016-04-11 MED ORDER — PANCRELIPASE (LIP-PROT-AMYL) 12000-38000 UNITS PO CPEP
24000.0000 [IU] | ORAL_CAPSULE | Freq: Three times a day (TID) | ORAL | Status: DC
  Administered 2016-04-11 – 2016-04-13 (×7): 24000 [IU] via ORAL
  Filled 2016-04-11 (×7): qty 2

## 2016-04-11 MED ORDER — VALSARTAN-HYDROCHLOROTHIAZIDE 80-12.5 MG PO TABS: 1.0000 | ORAL_TABLET | Freq: Every day | ORAL | Status: DC

## 2016-04-11 MED ORDER — AMPHETAMINE-DEXTROAMPHETAMINE 5 MG PO TABS: 10.0000 mg | ORAL_TABLET | Freq: Every day | ORAL | Status: DC

## 2016-04-11 MED ORDER — SODIUM CHLORIDE 0.9 % IJ SOLN
INTRAMUSCULAR | Status: AC
  Filled 2016-04-11: qty 50

## 2016-04-11 MED ORDER — HYDROCHLOROTHIAZIDE 12.5 MG PO CAPS
12.5000 mg | ORAL_CAPSULE | Freq: Every day | ORAL | Status: DC
  Administered 2016-04-11 – 2016-04-13 (×3): 12.5 mg via ORAL
  Filled 2016-04-11 (×3): qty 1

## 2016-04-11 MED ORDER — VITAMIN D3 25 MCG (1000 UNIT) PO TABS
1000.0000 [IU] | ORAL_TABLET | Freq: Every day | ORAL | Status: DC
  Administered 2016-04-11 – 2016-04-13 (×3): 1000 [IU] via ORAL
  Filled 2016-04-11 (×3): qty 1

## 2016-04-11 MED ORDER — SENNOSIDES-DOCUSATE SODIUM 8.6-50 MG PO TABS: 1.0000 | ORAL_TABLET | Freq: Every evening | ORAL | Status: DC | PRN

## 2016-04-11 MED ORDER — IOPAMIDOL (ISOVUE-300) INJECTION 61%
100.0000 mL | Freq: Once | INTRAVENOUS | Status: AC | PRN
  Administered 2016-04-11: 100 mL via INTRAVENOUS

## 2016-04-11 MED ORDER — MOMETASONE FURO-FORMOTEROL FUM 200-5 MCG/ACT IN AERO
2.0000 | INHALATION_SPRAY | Freq: Two times a day (BID) | RESPIRATORY_TRACT | Status: DC
Start: 2016-04-11 — End: 2016-04-13
  Administered 2016-04-11 – 2016-04-13 (×4): 2 via RESPIRATORY_TRACT
  Filled 2016-04-11: qty 8.8

## 2016-04-11 NOTE — Progress Notes (Signed)
Called for direct admission. Patient is a 75yo with a history of pancreatic cancer with liver metastasis. He is s/p biliary stent placement earlier this month. He developed low grade temperature (99.6 degrees farenheit) with concern for stent malfunction secondary to pain. CT significant for unlikely complete stent obstruction. GI Dr. Cristina Gong and Dr. Watt Climes consulted and recommended observation overnight, continue antibiotics and recheck labs in AM. Dr. Burr Medico to follow as well. Patient accepted to medical floor  Cordelia Poche, MD [facility] 04/11/2016, 11:18 AM Pager: [phone]

## 2016-04-11 NOTE — Assessment & Plan Note (Signed)
Patient continues to appear fairly dehydrated today; but no IV fluids were given while in the cancer Center today.

## 2016-04-11 NOTE — Consult Note (Signed)
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 He had obstructive jaundice and on November 17 underwent ERCP with biliary stent placement. He was admitted to the hospital because of right upper quadrant abdominal discomfort which he describes as a dull aching pain. A CT scan of the abdomen was done which shows progressive metastases of the liver. No obvious obstruction to the stent. Recent repeat liver enzymes yesterday show improvement of the total bilirubin. Alkaline phosphatase is somewhat more elevated. Transaminases also somewhat more elevated.  Review of Systems No chest pain or shortness of breath  Past Medical History:  Diagnosis Date  . Alcohol abuse   . Asthma   . Cancer [facility])    pancreatic with liver lesions  . Complication of anesthesia    PMH: hard to put to sleep  . Depression   . Depression with anxiety   . GERD (gastroesophageal reflux disease)   . GIB (gastrointestinal bleeding)   . Headache   . Heartburn   . Hypertension   . Neuromuscular disorder ([place])    pinched nerve  . Pancreatic mass   . Pneumonia   . TIA (transient ischemic attack)   . Tobacco abuse    Past Surgical History:  Procedure Laterality Date  . bleeding ulcer    . CATARACT EXTRACTION    . COLONOSCOPY    . ERCP N/A 03/29/2016   Procedure: ENDOSCOPIC RETROGRADE CHOLANGIOPANCREATOGRAPHY (ERCP);  Surgeon: Clarene Essex, MD;  Location: [facility] ENDOSCOPY;  Service: Endoscopy;  Laterality: N/A;  . MULTIPLE TOOTH EXTRACTIONS    . TONSILLECTOMY     Social History   Social History  . Marital status: Married    Spouse name: [name]  . Number of children: 3  . Years of education: N/A   Occupational History  . Author     Architect   Social History Main Topics  . Smoking status: Current Every Day Smoker    Packs/day: 0.50    Years: 40.00    Types: Cigarettes, Cigars  . Smokeless tobacco: Never Used  . Alcohol use Yes     Comment:  used to be a heavy drinker and stopped 20 years ago    . Drug use: No  . Sexual activity: Not on file   Other Topics Concern  . Not on file   Social History Narrative   Married to wife, [name] for 35+ years. 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  Current Facility-Administered Medications:  .  albuterol (PROVENTIL) (2.5 MG/3ML) 0.083% nebulizer solution 3 mL, 3 mL, Inhalation, Q6H PRN, Mariel Aloe, MD .  ALPRAZolam Duanne Moron) tablet 0.5-1 mg, 0.5-1 mg, Oral, BID PRN, Mariel Aloe, MD .  Derrill Memo ON 04/12/2016] amphetamine-dextroamphetamine (ADDERALL) tablet 10 mg, 10 mg, Oral, Q breakfast, Mariel Aloe, MD .  Derrill Memo ON 04/12/2016] amphetamine-dextroamphetamine (ADDERALL) tablet 5 mg, 5 mg, Oral, Q lunch, Mariel Aloe, MD .  cefTRIAXone (ROCEPHIN) 2 g in dextrose 5 % 50 mL IVPB, 2 g, Intravenous, Q24H, Mariel Aloe, MD .  cholecalciferol (VITAMIN D) tablet 1,000 Units, 1,000 Units, Oral, Daily, Mariel Aloe, MD .  enoxaparin (LOVENOX) injection 40 mg, 40 mg, Subcutaneous, Q24H,  Mariel Aloe, MD .  Derrill Memo ON 04/12/2016] FLUoxetine (PROZAC) capsule 40 mg, 40 mg, Oral, Q breakfast, Mariel Aloe, MD .  irbesartan (AVAPRO) tablet 75 mg, 75 mg, Oral, Daily **AND** hydrochlorothiazide (MICROZIDE) capsule 12.5 mg, 12.5 mg, Oral, Daily, Mariel Aloe, MD .  HYDROcodone-acetaminophen (NORCO) 10-325 MG per tablet 1 tablet, 1 tablet, Oral, Daily PRN, Mariel Aloe, MD .  lipase/protease/amylase (CREON) capsule 24,000 Units, 24,000 Units, Oral, TID WC, Mariel Aloe, MD .  loratadine (CLARITIN) tablet 10 mg, 10 mg, Oral, QHS, Mariel Aloe, MD .  mometasone-formoterol (DULERA) 200-5 MCG/ACT inhaler 2 puff, 2 puff, Inhalation, BID, Mariel Aloe, MD .   senna-docusate (Senokot-S) tablet 1 tablet, 1 tablet, Oral, QHS PRN, Mariel Aloe, MD  Facility-Administered Medications Ordered in Other Encounters:  .  iopamidol (ISOVUE-300) 61 % injection, , , ,  .  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 Patient now has ongoing problems with some right upper quadrant abdominal pain.      Plan:     Admission for pain control. Follow labs. Look for signs of further biliary obstruction related to stent occlusion although at this time I don't think the stent is occluded. Agree with antibiotics. We will follow clinically.

## 2016-04-11 NOTE — H&P (Signed)
History and Physical    Reginald Ochoa [id number] DOB: 06-19-1940 DOA: 04/11/2016  PCP: Elyn Peers, MD  Patient coming from: Home via [place] office  Chief Complaint: Abdominal pain (RUQ)  HPI: Reginald Ochoa is a 75 y.o. male with medical history significant of metastatic pancreatic cancer, hypertension, tobacco abuse, alcohol abuse, transaminitis, s/p biliary stent. Patient reports a 4 day history of worsening right upper quadrant pain. He describes it as an intermittent dull ache that is relieved with Norco. Nothing has aggravated his symptoms. He underwent biliary stent placement for obstruction from pancreatic mass earlier this month. He was seen yesterday by his oncologist and was started on ceftriaxone for prophylaxis. He returned today for a CT scan which was significant for worsening necrotic pancreatic adenopathy, persistent gallbladder distension w/o cholecystitis, bladder wall thickening possibly related to some outlet obstruction (prostamegaly).    Review of Systems: Review of Systems  Constitutional: Negative for chills, fever (low grade fever) and malaise/fatigue.  Respiratory: Negative for cough, sputum production, shortness of breath and wheezing.   Cardiovascular: Negative for chest pain, palpitations and leg swelling.  Gastrointestinal: Positive for abdominal pain, nausea and vomiting. Negative for blood in stool, constipation, diarrhea and melena.  All other systems reviewed and are negative.   Past Medical History:  Diagnosis Date  . Alcohol abuse   . Asthma   . Cancer [facility])    pancreatic with liver lesions  . Complication of anesthesia    PMH: hard to put to sleep  . Depression   . Depression with anxiety   . GERD (gastroesophageal reflux disease)   . GIB (gastrointestinal bleeding)   . Headache   . Heartburn   . Hypertension   . Neuromuscular disorder ([place])    pinched nerve  . Pancreatic mass   . Pneumonia   . TIA (transient ischemic  attack)   . Tobacco abuse     Past Surgical History:  Procedure Laterality Date  . bleeding ulcer    . CATARACT EXTRACTION    . COLONOSCOPY    . ERCP N/A 03/29/2016   Procedure: ENDOSCOPIC RETROGRADE CHOLANGIOPANCREATOGRAPHY (ERCP);  Surgeon: Clarene Essex, MD;  Location: [facility] ENDOSCOPY;  Service: Endoscopy;  Laterality: N/A;  . MULTIPLE TOOTH EXTRACTIONS    . TONSILLECTOMY       reports that he has been smoking Cigarettes and Cigars.  He has a 20.00 pack-year smoking history. He has never used smokeless tobacco. He reports that he drinks alcohol. He reports that he does not use drugs.  No Known Allergies  Family History  Problem Relation Age of Onset  . Stroke Mother   . Cancer Maternal Uncle     bvrain tumor   . Cancer Cousin     brain tumor     Prior to Admission medications   Medication Sig Start Date End Date Taking? Authorizing Provider  albuterol (PROVENTIL HFA;VENTOLIN HFA) 108 (90 BASE) MCG/ACT inhaler Inhale 2 puffs into the lungs every 6 (six) hours as needed for wheezing or shortness of breath.   Yes Historical Provider, MD  ALPRAZolam Duanne Moron) 1 MG tablet Take 0.5-1 mg by mouth 2 (two) times daily as needed for anxiety. Anxiety    Yes Historical Provider, MD  amphetamine-dextroamphetamine (ADDERALL) 10 MG tablet Take 5-10 mg by mouth daily with breakfast. 10mg  in the morning and 5 mg at lunch   Yes Historical Provider, MD  aspirin 325 MG tablet Take 325 mg by mouth every 6 (six) hours as needed for headache.  Yes Historical Provider, MD  budesonide-formoterol (SYMBICORT) 160-4.5 MCG/ACT inhaler Inhale 2 puffs into the lungs 2 (two) times daily.   Yes Historical Provider, MD  cetirizine (ZYRTEC) 10 MG tablet Take 10 mg by mouth at bedtime.   Yes Historical Provider, MD  cholecalciferol (VITAMIN D) 1000 units tablet Take 1,000 Units by mouth daily.   Yes Historical Provider, MD  FLUoxetine (PROZAC) 40 MG capsule Take 40 mg by mouth daily with breakfast.     Yes Historical  Provider, MD  guaiFENesin (MUCINEX) 600 MG 12 hr tablet Take 1 tablet (600 mg total) by mouth 2 (two) times daily. 03/07/16  Yes Lavina Hamman, MD  HYDROcodone-acetaminophen (NORCO) 10-325 MG tablet Take 1 tablet by mouth daily as needed for moderate pain.  02/06/16  Yes Historical Provider, MD  LUTEIN PO Take 1 tablet by mouth daily.   Yes Historical Provider, MD  Multiple Vitamins-Minerals (MULTIVITAMIN WITH MINERALS) tablet Take 1 tablet by mouth daily.    Yes Historical Provider, MD  ondansetron (ZOFRAN ODT) 8 MG disintegrating tablet Take 1 tablet (8 mg total) by mouth every 8 (eight) hours as needed for nausea or vomiting. 03/22/16  Yes Truitt Merle, MD  OVER THE COUNTER MEDICATION Place 2 sprays into both nostrils as needed (for congestion). [facility] Nasal Spray for congestion   Yes Historical Provider, MD  Pancrelipase, Lip-Prot-Amyl, (CREON) 24000-76000 units CPEP Take 1 capsule (24,000 Units total) by mouth 3 (three) times daily with meals. 03/08/16  Yes Truitt Merle, MD  prochlorperazine (COMPAZINE) 10 MG tablet Take 1 tablet (10 mg total) by mouth every 6 (six) hours as needed (Nausea or vomiting). 03/10/16  Yes Truitt Merle, MD  valsartan-hydrochlorothiazide (DIOVAN-HCT) 80-12.5 MG tablet Take 1 tablet by mouth daily.   Yes Historical Provider, MD  amLODipine (NORVASC) 2.5 MG tablet Take 1 tablet (2.5 mg total) by mouth daily. Patient not taking: Reported on 04/11/2016 03/08/16   Lavina Hamman, MD  senna-docusate (SENOKOT-S) 8.6-50 MG tablet Take 1 tablet by mouth at bedtime as needed for mild constipation. Patient not taking: Reported on 04/10/2016 03/07/16   Lavina Hamman, MD    Physical Exam: Vitals:   04/11/16 1156  BP: (!) 152/90  Pulse: 65  Resp: 16  Temp: 98 F (36.7 C)  TempSrc: Oral  SpO2: 100%  Weight: 55 kg (121 lb 4.1 oz)  Height: 5\' 6"  (1.676 m)     Constitutional: NAD, calm, comfortable Eyes: Exotropia. Slight scleral icterus.  ENMT: Mucous membranes are moist. Posterior  pharynx clear of any exudate or lesions. Neck: normal, supple, no masses, no thyromegaly Respiratory: clear to auscultation bilaterally, no wheezing, no crackles. Normal respiratory effort. No accessory muscle use.  Cardiovascular: Regular rate and rhythm, no murmurs / rubs / gallops. No extremity edema. 2+ pedal pulses.  Abdomen: Mild tenderness below liver. No hepatosplenomegaly. Bowel sounds positive.  Musculoskeletal: no clubbing / cyanosis. No joint deformity upper and lower extremities. Good ROM, no contractures. Normal muscle tone. Decreased muscle mass Skin: no rashes, lesions, ulcers. No induration Neurologic: CN 2-12 grossly intact. Sensation intact, DTR normal. Strength 4/5 in all 4.  Psychiatric: Normal judgment and insight. Alert and oriented x 3. Depressed mood.   Labs on Admission: I have personally reviewed following labs and imaging studies  CBC:  Recent Labs Lab 04/10/16 0935  WBC 9.4  NEUTROABS 7.3*  HGB 11.8*  HCT 36.1*  MCV 91.0  PLT [id number]   Basic Metabolic Panel:  Recent Labs Lab 04/10/16 0935  NA  139  K 3.9  CO2 30*  GLUCOSE 269*  BUN 18.0  CREATININE 1.3  CALCIUM 9.2   GFR: Estimated Creatinine Clearance: 38.8 mL/min (by C-G formula based on SCr of 1.3 mg/dL). Liver Function Tests:  Recent Labs Lab 04/10/16 0935  AST 107*  ALT 193*  ALKPHOS 891*  BILITOT 3.43*  PROT 6.3*  ALBUMIN 2.8*   No results for input(s): LIPASE, AMYLASE in the last 168 hours. No results for input(s): AMMONIA in the last 168 hours. Coagulation Profile: No results for input(s): INR, PROTIME in the last 168 hours. Cardiac Enzymes: No results for input(s): CKTOTAL, CKMB, CKMBINDEX, TROPONINI in the last 168 hours. BNP (last 3 results) No results for input(s): PROBNP in the last 8760 hours. HbA1C: No results for input(s): HGBA1C in the last 72 hours. CBG: No results for input(s): GLUCAP in the last 168 hours. Lipid Profile: No results for input(s): CHOL, HDL,  LDLCALC, TRIG, CHOLHDL, LDLDIRECT in the last 72 hours. Thyroid Function Tests: No results for input(s): TSH, T4TOTAL, FREET4, T3FREE, THYROIDAB in the last 72 hours. Anemia Panel: No results for input(s): VITAMINB12, FOLATE, FERRITIN, TIBC, IRON, RETICCTPCT in the last 72 hours. Urine analysis:    Component Value Date/Time   COLORURINE YELLOW 02/28/2016 [place] 02/28/2016 1830   LABSPEC 1.025 04/10/2016 1251   PHURINE 6.0 04/10/2016 1251   PHURINE 6.0 02/28/2016 1830   GLUCOSEU 100 04/10/2016 1251   HGBUR Negative 04/10/2016 1251   HGBUR NEGATIVE 02/28/2016 1830   BILIRUBINUR Unable to determine 04/10/2016 1251   KETONESUR Negative 04/10/2016 1251   KETONESUR 15 (A) 02/28/2016 1830   PROTEINUR < 30 04/10/2016 1251   PROTEINUR NEGATIVE 02/28/2016 1830   UROBILINOGEN 0.2 04/10/2016 1251   NITRITE Negative 04/10/2016 1251   NITRITE NEGATIVE 02/28/2016 1830   LEUKOCYTESUR Negative 04/10/2016 1251   Sepsis Labs: !!!!!!!!!!!!!!!!!!!!!!!!!!!!!!!!!!!!!!!!!!!! @LABRCNTIP (procalcitonin:4,lacticidven:4) )No results found for this or any previous visit (from the past 240 hour(s)).   Radiological Exams on Admission: Ct Abdomen Pelvis W Contrast  Result Date: 04/11/2016 CLINICAL DATA:  Recently diagnosed pancreatic cancer. Chemotherapy started. Fever. Increased right upper quadrant pain. Biliary stent placed 11/17. EXAM: CT ABDOMEN AND PELVIS WITH CONTRAST TECHNIQUE: Multidetector CT imaging of the abdomen and pelvis was performed using the standard protocol following bolus administration of intravenous contrast. CONTRAST:  112mL ISOVUE-300 IOPAMIDOL (ISOVUE-300) INJECTION 61% COMPARISON:  02/28/2016 all FINDINGS: Lower chest: Clear lung bases. Normal heart size without pericardial or pleural effusion. Hepatobiliary: Hepatic metastasis. Index hepatic dome lesion measures 3.1 cm in image 7/ series 7 versus 1.9 cm on the prior. An index lesion within the anterior right and medial  left hepatic lobes measures 2.2 cm on image 19/series 7 versus 1.3 cm on the prior exam (when remeasured). Other lesions are new and enlarged. Borderline gallbladder distension is unchanged. Probable sludge in the gallbladder. No specific evidence of acute cholecystitis. Pneumobilia. Mild intrahepatic biliary duct dilatation, increased since the prior CT. Common duct stent originates at the level porta hepatis and terminates within been descending duodenum. The common duct measures maximally 1.6 cm on image 27/series 7 just above the stent. High-density material within the stent including on image 36/series 7. Pancreas: Pancreatic body/ tail atrophy with duct dilatation. This continues to the level of a pancreatic head/ uncinate process mass which measures 3.7 x 3.8 cm on image 45/series 7. Compare 3.5 x 2.8 cm on the prior. No acute pancreatitis. Spleen: Old granulomatous disease in the spleen. Adrenals/Urinary Tract: Normal adrenal glands.  Right renal cysts. Normal left kidney, without hydronephrosis. Mild bladder wall thickening. Stomach/Bowel: Normal colon and terminal ileum.  Normal small bowel. Vascular/Lymphatic: Aortic and branch vessel atherosclerosis. Patent portal and hepatic veins. 10 mm portal caval node is not pathologic by size criteria but is enlarged from 6 mm on the prior. Necrotic peripancreatic adenopathy including at 1.4 cm anterior to the pancreatic head on image 46/series 7. Enlarged from 7 mm previously. No pelvic sidewall adenopathy. Reproductive: Moderate prostatomegaly. Probable dystrophic calcification within the penis. Other: No significant free fluid. No evidence of omental or peritoneal disease. Musculoskeletal: Lumbosacral spondylosis with convex right lumbar spine curvature. IMPRESSION: 1. Mild enlargement of pancreatic head/uncinate process mass and progression of hepatic metastasis. 2. Interval placement of a common duct stent. Since 02/28/2016, progression of biliary duct  dilatation. Pneumobilia, arguing against complete stent obstruction. Hyperattenuation within the stent could be artifactual. Cannot exclude stones within. 3. Persistent borderline gallbladder distention without specific evidence of acute cholecystitis. 4. Progressive peripancreatic nodal metastasis. 5. Prostatomegaly with bladder wall thickening, likely secondary and related to a component of outlet obstruction. Electronically Signed   By: Abigail Miyamoto M.D.   On: 04/11/2016 08:23    Assessment/Plan Principal Problem:   RUQ abdominal pain Active Problems:   Hypertension   Pancreatic cancer metastasized to liver ([place])   Unintentional weight loss   Transaminitis   Elevated alkaline phosphatase level   RUQ abdominal pain Likely related to liver infiltration of metastatic pancreatic cancer. Currently controlled and improved since starting 4 days ago. Gastroenterology, Dr. Watt Climes and Dr. Cristina Gong, were consulted and will see patient in the hospital. Do not feel symptoms are secondary to his stent from CT results. Associated elevated liver enzymes. No true fever but started on ceftriaxone for prophylaxis. GI recommended continuing while here overnight. -NPO after midnight -continue ceftriaxone -continue home Norco for pain  Elevated transaminases Elevated alkaline phosphatase Biliary stent Worsened from about two weeks ago. Bilirubin improved. -repeat CMP in AM -INR  Pancreatic cancer with liver metastasis Followed by Dr. Burr Medico. He is s/p one cycle of chemotherapy on 03/15/2016.  Hypertension -continue home valsartan-hydrochlorothiazide -hold amlodipine since patient is not taking -watch blood pressures  History of TIA Patient currently not on scheduled antiplatelet therapy. Has aspirin PRN for headaches. -follow-up outpatient  Depression Anxiety -continue xanax   Asthma Continue bronchodilators   DVT prophylaxis: Lovenox Code Status: Full code Family Communication: Wife and son  at bedside Disposition Plan: Anticipate discharge home tomorrow Consults called: Gastroenterology (Dr. Watt Climes and Buccini) by Oncology; Oncology (Dr. Burr Medico) Admission status: Observation, medical floor   Cordelia Poche, MD [facility] Pager [phone]  If 7PM-7AM, please contact night-coverage [email or web address] Password [email or web address]  04/11/2016, 1:07 PM

## 2016-04-11 NOTE — Progress Notes (Signed)
SYMPTOM MANAGEMENT CLINIC    Chief Complaint: Fever, abdominal pain, dehydration  HPI:  Reginald Ochoa 75 y.o. male diagnosed with pancreatic cancer.  Currently, and when gemcitabine/Abraxane chemotherapy regimen.   Oncology History   Pancreatic cancer metastasized to liver [facility])   Staging form: Pancreas, AJCC 7th Edition   - Clinical stage from 03/01/2016: Stage IV (T2, N1, M1) - Signed by Truitt Merle, MD on 03/07/2016      Pancreatic cancer metastasized to liver [facility])   02/28/2016 - 03/01/2016 Hospital Admission    Pt presented with syncope and diarrhea. c-diff (-), he underwent liver biopsy and CT scans, and was discharged home.       03/01/2016 Initial Diagnosis    Pancreatic cancer metastasized to liver ([place])      03/01/2016 Initial Biopsy    Liver biopsy showed adenocarcinoma, features are consistent with metastatic adenocarcinoma      03/01/2016 Imaging    CT chest, abdomen and pelvis with contrast showed hyperenhancing mass in the head and uncinate process of the pancreas, with small nodular density anterior to the mass is concerning for cellulitis lesions, multiple hepatic hypodensities lesions most compatible with metastatic disease.      03/01/2016 Tumor Marker    CEA 10.5, CA19.9 2        Review of Systems  Constitutional: Positive for chills, fever, malaise/fatigue and weight loss.  Gastrointestinal: Positive for abdominal pain and nausea.  Neurological: Positive for weakness.  All other systems reviewed and are negative.   Past Medical History:  Diagnosis Date  . Alcohol abuse   . Asthma   . Cancer [facility])    pancreatic with liver lesions  . Complication of anesthesia    PMH: hard to put to sleep  . Depression   . Depression with anxiety   . GERD (gastroesophageal reflux disease)   . GIB (gastrointestinal bleeding)   . Headache   . Heartburn   . Hypertension   . Neuromuscular disorder ([place])    pinched nerve  . Pancreatic mass   .  Pneumonia   . TIA (transient ischemic attack)   . Tobacco abuse     Past Surgical History:  Procedure Laterality Date  . bleeding ulcer    . CATARACT EXTRACTION    . COLONOSCOPY    . ERCP N/A 03/29/2016   Procedure: ENDOSCOPIC RETROGRADE CHOLANGIOPANCREATOGRAPHY (ERCP);  Surgeon: Clarene Essex, MD;  Location: [facility] ENDOSCOPY;  Service: Endoscopy;  Laterality: N/A;  . MULTIPLE TOOTH EXTRACTIONS    . TONSILLECTOMY      has Hypertension; Asthma exacerbation; Tobacco abuse; Depression with anxiety; Fall; Syncope; AKI (acute kidney injury) ([place]); Alcohol abuse; Tachycardia; Abnormal EKG; Pancreatic cancer metastasized to liver [facility]); Fever; Unintentional weight loss; Transaminitis; Dehydration; Bunion; RUQ abdominal pain; and Elevated alkaline phosphatase level on his problem list.    has No Known Allergies.    Medication List       Accurate as of 04/11/16 11:49 AM. Always use your most recent med list.          albuterol 108 (90 Base) MCG/ACT inhaler Commonly known as:  PROVENTIL HFA;VENTOLIN HFA Inhale 2 puffs into the lungs every 6 (six) hours as needed for wheezing or shortness of breath.   ALPRAZolam 1 MG tablet Commonly known as:  XANAX Take 0.5-1 mg by mouth 2 (two) times daily as needed for anxiety. Anxiety   amLODipine 2.5 MG tablet Commonly known as:  NORVASC Take 1 tablet (2.5 mg total) by mouth daily.  amphetamine-dextroamphetamine 10 MG tablet Commonly known as:  ADDERALL Take 5-10 mg by mouth daily with breakfast. '10mg'$  in the morning and 5 mg at lunch   aspirin 325 MG tablet Take 325 mg by mouth every 6 (six) hours as needed for headache.   budesonide-formoterol 160-4.5 MCG/ACT inhaler Commonly known as:  SYMBICORT Inhale 2 puffs into the lungs 2 (two) times daily.   cetirizine 10 MG tablet Commonly known as:  ZYRTEC Take 10 mg by mouth at bedtime.   FLUoxetine 40 MG capsule Commonly known as:  PROZAC Take 40 mg by mouth daily with breakfast.     guaiFENesin 600 MG 12 hr tablet Commonly known as:  MUCINEX Take 1 tablet (600 mg total) by mouth 2 (two) times daily.   HYDROcodone-acetaminophen 10-325 MG tablet Commonly known as:  NORCO Take 1 tablet by mouth daily as needed for moderate pain.   LUTEIN PO Take 1 tablet by mouth daily.   multivitamin with minerals tablet Take 1 tablet by mouth daily.   ondansetron 8 MG disintegrating tablet Commonly known as:  ZOFRAN ODT Take 1 tablet (8 mg total) by mouth every 8 (eight) hours as needed for nausea or vomiting.   ondansetron 8 MG tablet Commonly known as:  ZOFRAN Take 1 tablet (8 mg total) by mouth 2 (two) times daily as needed (Nausea or vomiting).   OVER THE COUNTER MEDICATION Place 2 sprays into both nostrils as needed (for congestion). [employer] Nasal Spray for congestion   Pancrelipase (Lip-Prot-Amyl) 24000-76000 units Cpep Commonly known as:  CREON Take 1 capsule (24,000 Units total) by mouth 3 (three) times daily with meals.   prochlorperazine 10 MG tablet Commonly known as:  COMPAZINE Take 1 tablet (10 mg total) by mouth every 6 (six) hours as needed (Nausea or vomiting).   senna-docusate 8.6-50 MG tablet Commonly known as:  Senokot-S Take 1 tablet by mouth at bedtime as needed for mild constipation.        PHYSICAL EXAMINATION  Oncology Vitals 04/11/2016 04/11/2016  Height 168 cm 168 cm  Weight 55 kg 56.291 kg  Weight (lbs) 121 lbs 4 oz 124 lbs 2 oz  BMI (kg/m2) 19.57 kg/m2 20.03 kg/m2  Temp 98 99  Pulse 65 111  Resp 16 17  SpO2 100 100  BSA (m2) 1.6 m2 1.62 m2   BP Readings from Last 2 Encounters:  04/11/16 (!) 152/90  04/11/16 (!) 152/98    Physical Exam  Constitutional: He is oriented to person, place, and time. He appears malnourished and dehydrated. He appears unhealthy. He appears cachectic.  HENT:  Head: Normocephalic and atraumatic.  Mouth/Throat: Oropharynx is clear and moist.  Eyes: Conjunctivae and EOM are normal. Pupils are equal,  round, and reactive to light. Right eye exhibits no discharge. Left eye exhibits no discharge. No scleral icterus.  Neck: Normal range of motion. Neck supple. No JVD present. No tracheal deviation present. No thyromegaly present.  Cardiovascular: Normal rate, regular rhythm, normal heart sounds and intact distal pulses.   Pulmonary/Chest: Effort normal and breath sounds normal. No respiratory distress. He has no wheezes. He has no rales. He exhibits no tenderness.  Abdominal: Soft. Bowel sounds are normal. He exhibits no distension and no mass. There is tenderness. There is no rebound and no guarding.  Musculoskeletal: Normal range of motion. He exhibits no edema, tenderness or deformity.  Lymphadenopathy:    He has no cervical adenopathy.  Neurological: He is alert and oriented to person, place, and time.  Skin: Skin  is warm and dry. No rash noted. No erythema. There is pallor.  Psychiatric: Affect normal.  Nursing note and vitals reviewed.   LABORATORY DATA:. Appointment on 04/10/2016  Component Date Value Ref Range Status  . BLOOD CULTURE, ROUTINE 04/10/2016 Preliminary report   Preliminary  . RESULT 1 04/10/2016 Comment   Preliminary  . BLOOD CULTURE, ROUTINE 04/10/2016 Preliminary report   Preliminary  . RESULT 1 04/10/2016 Comment   Preliminary  . Glucose 04/10/2016 100  Negative mg/dL Final  . Bilirubin (Urine) 04/10/2016 Unable to determine  Negative Final  . Ketones 04/10/2016 Negative  Negative mg/dL Final  . Specific Gravity, Urine 04/10/2016 1.025  1.003 - 1.035 Final  . Blood 04/10/2016 Negative  Negative Final  . pH 04/10/2016 6.0  4.6 - 8.0 Final  . Protein 04/10/2016 < 30  Negative- <30 mg/dL Final  . Urobilinogen, UR 04/10/2016 0.2  0.2 - 1 mg/dL Final  . Nitrite 04/10/2016 Negative  Negative Final  . Leukocyte Esterase 04/10/2016 Negative  Negative Final  . RBC / HPF 04/10/2016 Negative  0 - 2 Final  . WBC, UA 04/10/2016 0-2  0 - 2 Final  . Bacteria, UA 04/10/2016  Few  Negative- Trace Final  . Casts 04/10/2016 Occ Hyaline  Negative Final  . Crystals 04/10/2016 Many Ca Oxalate  Negative Final  . Epithelial Cells 04/10/2016 Occasional  Negative- Few Final  Appointment on 04/10/2016  Component Date Value Ref Range Status  . WBC 04/10/2016 9.4  4.0 - 10.3 10e3/uL Final  . NEUT# 04/10/2016 7.3* 1.5 - 6.5 10e3/uL Final  . HGB 04/10/2016 11.8* 13.0 - 17.1 g/dL Final  . HCT 04/10/2016 36.1* 38.4 - 49.9 % Final  . Platelets 04/10/2016 372  140 - 400 10e3/uL Final  . MCV 04/10/2016 91.0  79.3 - 98.0 fL Final  . MCH 04/10/2016 29.8  27.2 - 33.4 pg Final  . MCHC 04/10/2016 32.7  32.0 - 36.0 g/dL Final  . RBC 04/10/2016 3.96* 4.20 - 5.82 10e6/uL Final  . RDW 04/10/2016 15.7* 11.0 - 14.6 % Final  . lymph# 04/10/2016 1.0  0.9 - 3.3 10e3/uL Final  . MONO# 04/10/2016 0.8  0.1 - 0.9 10e3/uL Final  . Eosinophils Absolute 04/10/2016 0.3  0.0 - 0.5 10e3/uL Final  . Basophils Absolute 04/10/2016 0.1  0.0 - 0.1 10e3/uL Final  . NEUT% 04/10/2016 77.0* 39.0 - 75.0 % Final  . LYMPH% 04/10/2016 11.0* 14.0 - 49.0 % Final  . MONO% 04/10/2016 8.4  0.0 - 14.0 % Final  . EOS% 04/10/2016 3.0  0.0 - 7.0 % Final  . BASO% 04/10/2016 0.6  0.0 - 2.0 % Final  . Sodium 04/10/2016 139  136 - 145 mEq/L Final  . Potassium 04/10/2016 3.9  3.5 - 5.1 mEq/L Final  . Chloride 04/10/2016 101  98 - 109 mEq/L Final  . CO2 04/10/2016 30* 22 - 29 mEq/L Final  . Glucose 04/10/2016 269* 70 - 140 mg/dl Final  . BUN 04/10/2016 18.0  7.0 - 26.0 mg/dL Final  . Creatinine 04/10/2016 1.3  0.7 - 1.3 mg/dL Final  . Total Bilirubin 04/10/2016 3.43* 0.20 - 1.20 mg/dL Final  . Alkaline Phosphatase 04/10/2016 891* 40 - 150 U/L Final  . AST 04/10/2016 107* 5 - 34 U/L Final  . ALT 04/10/2016 193* 0 - 55 U/L Final  . Total Protein 04/10/2016 6.3* 6.4 - 8.3 g/dL Final  . Albumin 04/10/2016 2.8* 3.5 - 5.0 g/dL Final  . Calcium 04/10/2016 9.2  8.4 - 10.4  mg/dL Final  . Anion Gap 94/93/2638 8  3 - 11 mEq/L  Final  . EGFR 04/10/2016 53* >90 ml/min/1.73 m2 Final  . CA 19-9 04/11/2016 7  0 - 35 U/mL Final    RADIOGRAPHIC STUDIES: Ct Abdomen Pelvis W Contrast  Result Date: 04/11/2016 CLINICAL DATA:  Recently diagnosed pancreatic cancer. Chemotherapy started. Fever. Increased right upper quadrant pain. Biliary stent placed 11/17. EXAM: CT ABDOMEN AND PELVIS WITH CONTRAST TECHNIQUE: Multidetector CT imaging of the abdomen and pelvis was performed using the standard protocol following bolus administration of intravenous contrast. CONTRAST:  ISOVUE-300 IOPAMIDOL (ISOVUE-300) INJECTION 61% COMPARISON:  02/28/2016 all FINDINGS: Lower chest: Clear lung bases. Normal heart size without pericardial or pleural effusion. Hepatobiliary: Hepatic metastasis. Index hepatic dome lesion measures 3.1 cm in image 7/ series 7 versus 1.9 cm on the prior. An index lesion within the anterior right and medial left hepatic lobes measures 2.2 cm on image 19/series 7 versus 1.3 cm on the prior exam (when remeasured). Other lesions are new and enlarged. Borderline gallbladder distension is unchanged. Probable sludge in the gallbladder. No specific evidence of acute cholecystitis. Pneumobilia. Mild intrahepatic biliary duct dilatation, increased since the prior CT. Common duct stent originates at the level porta hepatis and terminates within been descending duodenum. The common duct measures maximally 1.6 cm on image 27/series 7 just above the stent. High-density material within the stent including on image 36/series 7. Pancreas: Pancreatic body/ tail atrophy with duct dilatation. This continues to the level of a pancreatic head/ uncinate process mass which measures 3.7 x 3.8 cm on image 45/series 7. Compare 3.5 x 2.8 cm on the prior. No acute pancreatitis. Spleen: Old granulomatous disease in the spleen. Adrenals/Urinary Tract: Normal adrenal glands. Right renal cysts. Normal left kidney, without hydronephrosis. Mild bladder wall  thickening. Stomach/Bowel: Normal colon and terminal ileum.  Normal small bowel. Vascular/Lymphatic: Aortic and branch vessel atherosclerosis. Patent portal and hepatic veins. 10 mm portal caval node is not pathologic by size criteria but is enlarged from 6 mm on the prior. Necrotic peripancreatic adenopathy including at 1.4 cm anterior to the pancreatic head on image 46/series 7. Enlarged from 7 mm previously. No pelvic sidewall adenopathy. Reproductive: Moderate prostatomegaly. Probable dystrophic calcification within the penis. Other: No significant free fluid. No evidence of omental or peritoneal disease. Musculoskeletal: Lumbosacral spondylosis with convex right lumbar spine curvature. IMPRESSION: 1. Mild enlargement of pancreatic head/uncinate process mass and progression of hepatic metastasis. 2. Interval placement of a common duct stent. Since 02/28/2016, progression of biliary duct dilatation. Pneumobilia, arguing against complete stent obstruction. Hyperattenuation within the stent could be artifactual. Cannot exclude stones within. 3. Persistent borderline gallbladder distention without specific evidence of acute cholecystitis. 4. Progressive peripancreatic nodal metastasis. 5. Prostatomegaly with bladder wall thickening, likely secondary and related to a component of outlet obstruction. Electronically Signed   By: Jeronimo Greaves M.D.   On: 04/11/2016 08:23    ASSESSMENT/PLAN:    Pancreatic cancer metastasized to liver [facility]) Patient received cycle 1, day 1 of his gemcitabine/Abraxane chemotherapy regimen on 03/15/2016.  He presented to the cancer Center on 03/22/2016 for day 8 of his chemotherapy regimen; but the chemotherapy therapy was held due to the transaminitis issue.  Patient underwent a biliary stent placement on 03/29/2016.  See further notes for details of today's visit.  Patient is scheduled to return for labs, visit, and his next cycle of chemotherapy on  04/12/2016. ______________________________  Update: Patient underwent a restaging scan earlier this morning which  revealed:  IMPRESSION: 1. Mild enlargement of pancreatic head/uncinate process mass and progression of hepatic metastasis. 2. Interval placement of a common duct stent. Since 02/28/2016, progression of biliary duct dilatation. Pneumobilia, arguing against complete stent obstruction. Hyperattenuation within the stent could be artifactual. Cannot exclude stones within. 3. Persistent borderline gallbladder distention without specific evidence of acute cholecystitis. 4. Progressive peripancreatic nodal metastasis. 5. Prostatomegaly with bladder wall thickening, likely secondary and related to a component of outlet obstruction.  Reviewed all findings of CT scan results with Dr. Burr Medico; and then this provider reviewed.  CT findings.  Once again with the radiologist that read the CT.  Also, reviewed the CT results in detail with Dr. Kalman Shan gastroenterologist as well.  He consulted with Dr. Watt Climes (GI) as well.  Dr. Kalman Shan recommended that patient be admitted to the hospital for further evaluation and management.  He compared liver enzymes from yesterday to labs drawn in his office on 04/09/2016.  The liver enzymes were slightly improved during that 24-hour period.  See further notes for details of today's visit.  Patient will be direct admitted per Dr.Nettey hospitalist.  Brief history.  Report were given to the hospitalist; prior to patient being transported to the floor for direct admission via wheelchair.  Per the cancer Center nurse.  CODE STATUS: Patient should be considered a full code.  Since he has no advance directives in his chart.    Fever Patient had a biliary stent placed on November 17.  He presented to the [place] today with complaint of right upper quadrant and right lower quadrant pain; as well as a fever to a maximum of 99.6 only.  He denies any vomiting;  but feels that he has been nauseous at times.  He states his bowels are moving regularly.  On exam today there is mild to moderate tenderness to the entire right side of his abdomen with palpation.  There was no flank pain.  Patient appears on well; and is mildly jaundiced.  Also, is noted.  The sclerae are jaundiced as well.  Vital signs today reveal heart rate 99, and temperature 99.6.  Labs obtained today revealed that AST is actually increased from 93 up to 107 and ALT has increased from 166-193.  The liver enzymes are actually higher now been just prior to biliary stent placement.  Also, bilirubin remains elevated at 3.3 today.  Prior to biliary stent placement bilirubin was 7.1.  Also, alkaline phosphatase has increased from 524 up to 891.  Reviewed all findings with Dr. Burr Medico; and attempted to obtain a stat CT with contrast of the abdomen and pelvis this afternoon for further evaluation.  However, was unable to obtain the CT until first thing tomorrow morning at 7:30 AM.  Also contemplated transporting patient to the emergency department to have the CT obtained; but there would also be an extended wait for the CT if patient was taken to the emergency department.  In the meantime-patient will receive IV fluid rehydration, and Rocephin 1 g IV prophylactically.  Confirmed that urinalysis, urine culture, and blood cultures 2 have already been obtained prior to initiation of the antibiotics.  Urinalysis obtained today was negative for UTI.  The plan is for the patient to begin drinking the first bottle of contrast at 5:30 AM tomorrow morning, the second bottle of contrast at 6:30 AM tomorrow morning to my: And then present for the CT with contrast of the abdomen/pelvis to tomorrow morning the radiology department to [facility] hospital at  7:30 AM.  He will then return to the [place] for additional IV fluid rehydration and a recheck while awaiting CT results.  Patient was also informed that he  needed to stay nothing by mouth for approximate 4 hours prior to the CT scan.  Advised both patient and his son that they should keep the patient nothing by mouth after the CT scan as well until further notice.  Patient was advised to go directly to the emergency department overnight.  If he develops any worsening symptoms whatsoever. ______________________________________________  Update: Patient presented back to the [place] today for recheck.  He just completed his CT of the abdomen and pelvis for restaging purposes earlier this morning.  He had a fever to 99.0 today.  Patient's urinalysis yesterday revealed no UTI; the urine culture results are pending.  Also, obtain blood cultures 2 yesterday as well.  Patient was given Rocephin IV 1 time while in the cancer Center yesterday.  After reviewing CT results with oncology and gastroenterology-decision was made to admit the patient for further evaluation and monitoring.  Dr. Kalman Shan gastroenterologist recommended that patient continue with antibiotics and also suggested probiotics as well in hopes of preventing any issues with C. difficile.  He also stated that the gastroenterology group would follow the patient while he is in the hospital.  See further notes for details.  Dehydration Patient continues to appear fairly dehydrated today; but no IV fluids were given while in the cancer Center today.   Patient stated understanding of all instructions; and was in agreement with this plan of care. The patient knows to call the clinic with any problems, questions or concerns.   Total time spent with patient was 40  minutes;  with greater than 75 percent of that time spent in face to face counseling regarding patient's symptoms,  and coordination of care and follow up.  Disclaimer:This dictation was prepared with Dragon/digital dictation along with [employer]. Any transcriptional errors that result from this process are  unintentional.  Drue Second, NP 04/11/2016

## 2016-04-11 NOTE — Assessment & Plan Note (Signed)
Patient had a biliary stent placed on November 17.  He presented to the [place] today with complaint of right upper quadrant and right lower quadrant pain; as well as a fever to a maximum of 99.6 only.  He denies any vomiting; but feels that he has been nauseous at times.  He states his bowels are moving regularly.  On exam today there is mild to moderate tenderness to the entire right side of his abdomen with palpation.  There was no flank pain.  Patient appears on well; and is mildly jaundiced.  Also, is noted.  The sclerae are jaundiced as well.  Vital signs today reveal heart rate 99, and temperature 99.6.  Labs obtained today revealed that AST is actually increased from 93 up to 107 and ALT has increased from 166-193.  The liver enzymes are actually higher now been just prior to biliary stent placement.  Also, bilirubin remains elevated at 3.3 today.  Prior to biliary stent placement bilirubin was 7.1.  Also, alkaline phosphatase has increased from 524 up to 891.  Reviewed all findings with Dr. Burr Medico; and attempted to obtain a stat CT with contrast of the abdomen and pelvis this afternoon for further evaluation.  However, was unable to obtain the CT until first thing tomorrow morning at 7:30 AM.  Also contemplated transporting patient to the emergency department to have the CT obtained; but there would also be an extended wait for the CT if patient was taken to the emergency department.  In the meantime-patient will receive IV fluid rehydration, and Rocephin 1 g IV prophylactically.  Confirmed that urinalysis, urine culture, and blood cultures 2 have already been obtained prior to initiation of the antibiotics.  Urinalysis obtained today was negative for UTI.  The plan is for the patient to begin drinking the first bottle of contrast at 5:30 AM tomorrow morning, the second bottle of contrast at 6:30 AM tomorrow morning to my: And then present for the CT with contrast of the abdomen/pelvis to  tomorrow morning the radiology department to [facility] at 7:30 AM.  He will then return to the [place] for additional IV fluid rehydration and a recheck while awaiting CT results.  Patient was also informed that he needed to stay nothing by mouth for approximate 4 hours prior to the CT scan.  Advised both patient and his son that they should keep the patient nothing by mouth after the CT scan as well until further notice.  Patient was advised to go directly to the emergency department overnight.  If he develops any worsening symptoms whatsoever. ______________________________________________  Update: Patient presented back to the [place] today for recheck.  He just completed his CT of the abdomen and pelvis for restaging purposes earlier this morning.  He had a fever to 99.0 today.  Patient's urinalysis yesterday revealed no UTI; the urine culture results are pending.  Also, obtain blood cultures 2 yesterday as well.  Patient was given Rocephin IV 1 time while in the cancer Center yesterday.  After reviewing CT results with oncology and gastroenterology-decision was made to admit the patient for further evaluation and monitoring.  Dr. Kalman Shan gastroenterologist recommended that patient continue with antibiotics and also suggested probiotics as well in hopes of preventing any issues with C. difficile.  He also stated that the gastroenterology group would follow the patient while he is in the hospital.  See further notes for details.

## 2016-04-11 NOTE — Assessment & Plan Note (Signed)
Patient received cycle 1, day 1 of his gemcitabine/Abraxane chemotherapy regimen on 03/15/2016.  He presented to the [facility] on 03/22/2016 for day 8 of his chemotherapy regimen; but the chemotherapy therapy was held due to the transaminitis issue.  Patient underwent a biliary stent placement on 03/29/2016.  See further notes for details of today's visit.  Patient is scheduled to return for labs, visit, and his next cycle of chemotherapy on 04/12/2016. ______________________________  Update: Patient underwent a restaging scan earlier this morning which revealed:  IMPRESSION: 1. Mild enlargement of pancreatic head/uncinate process mass and progression of hepatic metastasis. 2. Interval placement of a common duct stent. Since 02/28/2016, progression of biliary duct dilatation. Pneumobilia, arguing against complete stent obstruction. Hyperattenuation within the stent could be artifactual. Cannot exclude stones within. 3. Persistent borderline gallbladder distention without specific evidence of acute cholecystitis. 4. Progressive peripancreatic nodal metastasis. 5. Prostatomegaly with bladder wall thickening, likely secondary and related to a component of outlet obstruction.  Reviewed all findings of CT scan results with Dr. Burr Medico; and then this provider reviewed.  CT findings.  Once again with the radiologist that read the CT.  Also, reviewed the CT results in detail with Dr. Kalman Shan gastroenterologist as well.  He consulted with Dr. Watt Climes (GI) as well.  Dr. Kalman Shan recommended that patient be admitted to the hospital for further evaluation and management.  He compared liver enzymes from yesterday to labs drawn in his office on 04/09/2016.  The liver enzymes were slightly improved during that 24-hour period.  See further notes for details of today's visit.  Patient will be direct admitted per Dr.Nettey hospitalist.  Brief history.  Report were given to the hospitalist; prior to  patient being transported to the floor for direct admission via wheelchair.  Per the cancer Center nurse.  CODE STATUS: Patient should be considered a full code.  Since he has no advance directives in his chart.

## 2016-04-12 ENCOUNTER — Ambulatory Visit: Payer: Medicare Other | Admitting: Hematology

## 2016-04-12 ENCOUNTER — Ambulatory Visit: Payer: Medicare Other

## 2016-04-12 ENCOUNTER — Other Ambulatory Visit: Payer: Medicare Other

## 2016-04-12 DIAGNOSIS — C259 Malignant neoplasm of pancreas, unspecified: Principal | ICD-10-CM

## 2016-04-12 DIAGNOSIS — R740 Nonspecific elevation of levels of transaminase and lactic acid dehydrogenase [LDH]: Secondary | ICD-10-CM

## 2016-04-12 DIAGNOSIS — F17210 Nicotine dependence, cigarettes, uncomplicated: Secondary | ICD-10-CM | POA: Diagnosis present

## 2016-04-12 DIAGNOSIS — R1011 Right upper quadrant pain: Secondary | ICD-10-CM

## 2016-04-12 DIAGNOSIS — K5900 Constipation, unspecified: Secondary | ICD-10-CM | POA: Diagnosis not present

## 2016-04-12 DIAGNOSIS — I10 Essential (primary) hypertension: Secondary | ICD-10-CM

## 2016-04-12 DIAGNOSIS — R748 Abnormal levels of other serum enzymes: Secondary | ICD-10-CM

## 2016-04-12 DIAGNOSIS — C787 Secondary malignant neoplasm of liver and intrahepatic bile duct: Secondary | ICD-10-CM | POA: Diagnosis not present

## 2016-04-12 DIAGNOSIS — E43 Unspecified severe protein-calorie malnutrition: Secondary | ICD-10-CM

## 2016-04-12 DIAGNOSIS — Z8673 Personal history of transient ischemic attack (TIA), and cerebral infarction without residual deficits: Secondary | ICD-10-CM | POA: Diagnosis not present

## 2016-04-12 DIAGNOSIS — R109 Unspecified abdominal pain: Secondary | ICD-10-CM | POA: Diagnosis present

## 2016-04-12 DIAGNOSIS — Z7952 Long term (current) use of systemic steroids: Secondary | ICD-10-CM | POA: Diagnosis not present

## 2016-04-12 DIAGNOSIS — F419 Anxiety disorder, unspecified: Secondary | ICD-10-CM | POA: Diagnosis present

## 2016-04-12 DIAGNOSIS — Z681 Body mass index (BMI) 19 or less, adult: Secondary | ICD-10-CM | POA: Diagnosis not present

## 2016-04-12 DIAGNOSIS — K831 Obstruction of bile duct: Secondary | ICD-10-CM | POA: Diagnosis present

## 2016-04-12 DIAGNOSIS — J45909 Unspecified asthma, uncomplicated: Secondary | ICD-10-CM | POA: Diagnosis present

## 2016-04-12 DIAGNOSIS — E46 Unspecified protein-calorie malnutrition: Secondary | ICD-10-CM

## 2016-04-12 DIAGNOSIS — F17290 Nicotine dependence, other tobacco product, uncomplicated: Secondary | ICD-10-CM | POA: Diagnosis present

## 2016-04-12 DIAGNOSIS — F329 Major depressive disorder, single episode, unspecified: Secondary | ICD-10-CM | POA: Diagnosis present

## 2016-04-12 DIAGNOSIS — Z79899 Other long term (current) drug therapy: Secondary | ICD-10-CM | POA: Diagnosis not present

## 2016-04-12 LAB — COMPREHENSIVE METABOLIC PANEL
ALT: 149 U/L — AB (ref 17–63)
AST: 122 U/L — AB (ref 15–41)
Albumin: 3 g/dL — ABNORMAL LOW (ref 3.5–5.0)
Alkaline Phosphatase: 854 U/L — ABNORMAL HIGH (ref 38–126)
Anion gap: 9 (ref 5–15)
BILIRUBIN TOTAL: 2.8 mg/dL — AB (ref 0.3–1.2)
BUN: 13 mg/dL (ref 6–20)
CO2: 27 mmol/L (ref 22–32)
CREATININE: 1.05 mg/dL (ref 0.61–1.24)
Calcium: 8.3 mg/dL — ABNORMAL LOW (ref 8.9–10.3)
Chloride: 97 mmol/L — ABNORMAL LOW (ref 101–111)
GFR calc Af Amer: >60 mL/min (ref >60)
Glucose, Bld: 222 mg/dL — ABNORMAL HIGH (ref 65–99)
Potassium: 3.6 mmol/L (ref 3.5–5.1)
Sodium: 133 mmol/L — ABNORMAL LOW (ref 135–145)
TOTAL PROTEIN: 5.9 g/dL — AB (ref 6.5–8.1)

## 2016-04-12 LAB — PROTIME-INR
INR: 1.09
PROTHROMBIN TIME: 14.1 s (ref 11.4–15.2)

## 2016-04-12 LAB — GLUCOSE, CAPILLARY: Glucose-Capillary: 347 mg/dL — ABNORMAL HIGH (ref 65–99)

## 2016-04-12 MED ORDER — AMPHETAMINE-DEXTROAMPHETAMINE 10 MG PO TABS
5.0000 mg | ORAL_TABLET | Freq: Every day | ORAL | Status: DC
  Administered 2016-04-12 – 2016-04-13 (×2): 5 mg via ORAL
  Filled 2016-04-12: qty 1

## 2016-04-12 MED ORDER — AMPHETAMINE-DEXTROAMPHETAMINE 10 MG PO TABS
10.0000 mg | ORAL_TABLET | Freq: Every day | ORAL | Status: DC
  Administered 2016-04-12 – 2016-04-13 (×2): 10 mg via ORAL
  Filled 2016-04-12 (×2): qty 1

## 2016-04-12 MED ORDER — AMPHETAMINE-DEXTROAMPHETAMINE 5 MG PO TABS: 10.0000 mg | ORAL_TABLET | Freq: Every day | ORAL | Status: DC

## 2016-04-12 MED ORDER — PRO-STAT SUGAR FREE PO LIQD
30.0000 mL | Freq: Three times a day (TID) | ORAL | Status: DC
  Administered 2016-04-12 – 2016-04-13 (×3): 30 mL via ORAL
  Filled 2016-04-12 (×4): qty 30

## 2016-04-12 MED ORDER — PROCHLORPERAZINE EDISYLATE 5 MG/ML IJ SOLN: 10.0000 mg | Freq: Four times a day (QID) | INTRAMUSCULAR | Status: DC | PRN

## 2016-04-12 MED ORDER — ONDANSETRON HCL 4 MG/2ML IJ SOLN: 4.0000 mg | Freq: Four times a day (QID) | INTRAMUSCULAR | Status: DC | PRN

## 2016-04-12 NOTE — Progress Notes (Addendum)
[facility] PROGRESS NOTE  Reginald Ochoa [id number] DOB: Sep 30, 1940 DOA: 04/11/2016 PCP: Elyn Peers, MD  Interim summary and HPI 75 y.o. male with medical history significant of metastatic pancreatic cancer, hypertension, tobacco abuse, alcohol abuse, transaminitis, s/p biliary stent. Patient reports a 4 day history of worsening right upper quadrant pain. He describes it as an intermittent dull ache that is relieved with Norco. Nothing has aggravated his symptoms. He underwent biliary stent placement for obstruction from pancreatic mass earlier this month. He was seen yesterday by his oncologist and was started on ceftriaxone for prophylaxis. He returned today for a CT scan which was significant for worsening necrotic pancreatic adenopathy, persistent gallbladder distension w/o cholecystitis, bladder wall thickening possibly related to some outlet obstruction.  Assessment/Plan: RUQ abdominal pain -Likely related to liver infiltration of metastatic pancreatic cancer.  -Currently better and will like to eat something -Do not feel symptoms are secondary to his stent, base on CT results.  -GI is on board and will follow rec's -so far, plan is to advance diet and continue supportive care and symptomatic management  -will continue abx's   Elevated transaminases Elevated alkaline phosphatase S/P Biliary stent -Worsened from about two weeks ago.  -Bilirubin improved. -repeat CMP in AM -INR stable   Pancreatic cancer with liver metastasis -Followed by Dr. Burr Medico.  -He is s/p one cycle of chemotherapy on 03/15/2016. -will follow oncology rec's  Hypertension -continue home valsartan-hydrochlorothiazide -watch blood pressures and adjust medications as needed   History of TIA -Patient currently not on scheduled antiplatelet therapy.  -Has aspirin PRN for headaches.  Depression and Anxiety -continue xanax  -no hallucinations, no SI  Asthma -Continue  bronchodilators -no wheezing   Severe protein calorie malnutrition -will ask nutritional service to see patient -follow rec's for feeding supplementation   Code Status: Full Family Communication: son at bedside  Disposition Plan: to be determine; patient with poor overall prognosis. Per GI stent appears to be working properly and no interventions anticipated. Continue supportive care, follow LFT's, continue abx's and advance diet.   Consultants:  GI  Oncology   Procedures:  See below for x-ray reports   Antibiotics:  Rocephin   HPI/Subjective: Afebrile, no CP, no SOB, mildly icteric on exam. Reports some nausea and abd pain.  Objective: Vitals:   04/11/16 2118 04/12/16 0620  BP: (!) 127/91 (!) 119/91  Pulse: 65 (!) 107  Resp: 16 16  Temp: 98.2 F (36.8 C) 98.3 F (36.8 C)    Intake/Output Summary (Last 24 hours) at 04/12/16 1411 Last data filed at 04/11/16 1900  Gross per 24 hour  Intake              480 ml  Output                0 ml  Net              480 ml   Filed Weights   04/11/16 1156  Weight: 55 kg (121 lb 4.1 oz)    Exam:   General:  Afebrile, reports abd pain mid epigastric area and RUQ; also reported some nausea. patient is yellowish on appearance, frail and underweight.  Cardiovascular: S1 and Se, no rubs  Respiratory: CTA bilaterally  Abdomen: positive BS, mild guarding, no distension   Musculoskeletal: no edema or cyanosis   Data Reviewed: Basic Metabolic Panel:  Recent Labs Lab 04/10/16 0935 04/12/16 0345  NA 139 133*  K 3.9 3.6  CL  --  97*  CO2 30* 27  GLUCOSE 269* 222*  BUN 18.0 13  CREATININE 1.3 1.05  CALCIUM 9.2 8.3*   Liver Function Tests:  Recent Labs Lab 04/10/16 0935 04/12/16 0345  AST 107* 122*  ALT 193* 149*  ALKPHOS 891* 854*  BILITOT 3.43* 2.8*  PROT 6.3* 5.9*  ALBUMIN 2.8* 3.0*   CBC:  Recent Labs Lab 04/10/16 0935  WBC 9.4  NEUTROABS 7.3*  HGB 11.8*  HCT 36.1*  MCV 91.0  PLT 372    CBG: No results for input(s): GLUCAP in the last 168 hours.  Recent Results (from the past 240 hour(s))  Culture, Blood     Status: None (Preliminary result)   Collection Time: 04/10/16 11:48 AM  Result Value Ref Range Status   BLOOD CULTURE, ROUTINE Preliminary report  Preliminary   RESULT 1 Comment  Preliminary    Comment: No growth in 36 - 48 hours.  Culture, Blood     Status: None (Preliminary result)   Collection Time: 04/10/16 11:49 AM  Result Value Ref Range Status   BLOOD CULTURE, ROUTINE Preliminary report  Preliminary   RESULT 1 Comment  Preliminary    Comment: No growth in 36 - 48 hours.  Urine Culture     Status: None   Collection Time: 04/10/16  1:45 PM  Result Value Ref Range Status   Urine Culture, Routine Final report  Final   Urine Culture result 1 No growth  Final     Studies: Ct Abdomen Pelvis W Contrast  Result Date: 04/11/2016 CLINICAL DATA:  Recently diagnosed pancreatic cancer. Chemotherapy started. Fever. Increased right upper quadrant pain. Biliary stent placed 11/17. EXAM: CT ABDOMEN AND PELVIS WITH CONTRAST TECHNIQUE: Multidetector CT imaging of the abdomen and pelvis was performed using the standard protocol following bolus administration of intravenous contrast. CONTRAST:  157mL ISOVUE-300 IOPAMIDOL (ISOVUE-300) INJECTION 61% COMPARISON:  02/28/2016 all FINDINGS: Lower chest: Clear lung bases. Normal heart size without pericardial or pleural effusion. Hepatobiliary: Hepatic metastasis. Index hepatic dome lesion measures 3.1 cm in image 7/ series 7 versus 1.9 cm on the prior. An index lesion within the anterior right and medial left hepatic lobes measures 2.2 cm on image 19/series 7 versus 1.3 cm on the prior exam (when remeasured). Other lesions are new and enlarged. Borderline gallbladder distension is unchanged. Probable sludge in the gallbladder. No specific evidence of acute cholecystitis. Pneumobilia. Mild intrahepatic biliary duct dilatation,  increased since the prior CT. Common duct stent originates at the level porta hepatis and terminates within been descending duodenum. The common duct measures maximally 1.6 cm on image 27/series 7 just above the stent. High-density material within the stent including on image 36/series 7. Pancreas: Pancreatic body/ tail atrophy with duct dilatation. This continues to the level of a pancreatic head/ uncinate process mass which measures 3.7 x 3.8 cm on image 45/series 7. Compare 3.5 x 2.8 cm on the prior. No acute pancreatitis. Spleen: Old granulomatous disease in the spleen. Adrenals/Urinary Tract: Normal adrenal glands. Right renal cysts. Normal left kidney, without hydronephrosis. Mild bladder wall thickening. Stomach/Bowel: Normal colon and terminal ileum.  Normal small bowel. Vascular/Lymphatic: Aortic and branch vessel atherosclerosis. Patent portal and hepatic veins. 10 mm portal caval node is not pathologic by size criteria but is enlarged from 6 mm on the prior. Necrotic peripancreatic adenopathy including at 1.4 cm anterior to the pancreatic head on image 46/series 7. Enlarged from 7 mm previously. No pelvic sidewall adenopathy. Reproductive: Moderate prostatomegaly. Probable dystrophic calcification within the penis. Other:  No significant free fluid. No evidence of omental or peritoneal disease. Musculoskeletal: Lumbosacral spondylosis with convex right lumbar spine curvature. IMPRESSION: 1. Mild enlargement of pancreatic head/uncinate process mass and progression of hepatic metastasis. 2. Interval placement of a common duct stent. Since 02/28/2016, progression of biliary duct dilatation. Pneumobilia, arguing against complete stent obstruction. Hyperattenuation within the stent could be artifactual. Cannot exclude stones within. 3. Persistent borderline gallbladder distention without specific evidence of acute cholecystitis. 4. Progressive peripancreatic nodal metastasis. 5. Prostatomegaly with bladder wall  thickening, likely secondary and related to a component of outlet obstruction. Electronically Signed   By: Abigail Miyamoto M.D.   On: 04/11/2016 08:23    Scheduled Meds: . amphetamine-dextroamphetamine  10 mg Oral Q breakfast  . amphetamine-dextroamphetamine  5 mg Oral Q lunch  . cefTRIAXone (ROCEPHIN)  IV  2 g Intravenous Q24H  . cholecalciferol  1,000 Units Oral Daily  . enoxaparin (LOVENOX) injection  40 mg Subcutaneous Q24H  . feeding supplement (PRO-STAT SUGAR FREE 64)  30 mL Oral TID  . FLUoxetine  40 mg Oral Q breakfast  . irbesartan  75 mg Oral Daily   And  . hydrochlorothiazide  12.5 mg Oral Daily  . lipase/protease/amylase  24,000 Units Oral TID WC  . loratadine  10 mg Oral QHS  . mometasone-formoterol  2 puff Inhalation BID   Continuous Infusions:     Time spent: 25 minutes    Barton Dubois  [facility] Pager [phone]. If 7PM-7AM, please contact night-coverage at [email or web address], password [facility] 04/12/2016, 2:11 PM  LOS: 0 days

## 2016-04-12 NOTE — Progress Notes (Signed)
Initial Nutrition Assessment  DOCUMENTATION CODES:   Severe malnutrition in context of acute illness/injury  INTERVENTION:   -Provide Prostat liquid protein PO 30 ml TID with meals, each supplement provides 100 kcal, 15 grams protein. -Encourage small, frequent meals -RD to continue to monitor  NUTRITION DIAGNOSIS:   Malnutrition related to acute illness as evidenced by percent weight loss, severe depletion of body fat, severe depletion of muscle mass.  GOAL:   Patient will meet greater than or equal to 90% of their needs  MONITOR:   PO intake, Supplement acceptance, Labs, Weight trends, I & O's  REASON FOR ASSESSMENT:   Malnutrition Screening Tool    ASSESSMENT:   75 y.o. male with medical history significant of metastatic pancreatic cancer, hypertension, tobacco abuse, alcohol abuse, transaminitis, s/p biliary stent. Patient reports a 4 day history of worsening right upper quadrant pain. He describes it as an intermittent dull ache that is relieved with Norco. Nothing has aggravated his symptoms. He underwent biliary stent placement for obstruction from pancreatic mass earlier this month. He was seen yesterday by his oncologist and was started on ceftriaxone for prophylaxis. He returned today for a CT scan which was significant for worsening necrotic pancreatic adenopathy, persistent gallbladder distension w/o cholecystitis, bladder wall thickening possibly related to some outlet obstruction (prostamegaly).  Pt eating lunch with son at bedside. Pt reports good appetite and eating normally. Pt's son, however, states there has been a change in intake over the past couple of weeks d/t pain levels. Pt states his pain was well controlled yesterday so he ate very well. Pt currently eating french fries, cheeseburger, 2% milk and chocolate pudding cake. Pt states he cannot eat much at one time. RD encouraged small, frequent meals emphasizing protein with each meal or snack. Pt states he  does not like Ensure/Boost drinks. Pt is interested in trying Prostat supplements. RD to order. Pt's son believes he will do well with 30 ml Prostat d/t the lower volume the pt will have to drink.   Per chart review, pt has lost 39 lb since 10/18 ( 24% wt loss x 1.5 months, significant for time frame). Pt states his arms shake a lot and he is weaker when walking. Nutrition-Focused physical exam completed. Findings are severe fat depletion, severe muscle depletion, and no edema.   Medications: Vitamin D tablet daily, CREON TID w/ meals Labs reviewed: Low Na  Diet Order:  Diet regular Room service appropriate? Yes; Fluid consistency: Thin  Skin:  Reviewed, no issues  Last BM:  11/30  Height:   Ht Readings from Last 1 Encounters:  04/11/16 5\' 6"  (1.676 m)    Weight:   Wt Readings from Last 1 Encounters:  04/11/16 121 lb 4.1 oz (55 kg)    Ideal Body Weight:  64.5 kg  BMI:  Body mass index is 19.57 kg/m.  Estimated Nutritional Needs:   Kcal:  1700-1900  Protein:  80-90g  Fluid:  1.7-1.9L/day  EDUCATION NEEDS:   Education needs addressed  Clayton Bibles, MS, RD, LDN Pager: [phone] After Hours Pager: [phone]

## 2016-04-12 NOTE — Progress Notes (Signed)
Reginald Ochoa   DOB:14-Apr-1941   [id number]   [id number]  ONCOLOGY FOLLOW UP   Subjective: Pt was admitted from our clinic yesterday for fever and abdominal pain. He feels better overall today, was eating dinner when I saw him.    Objective:  Vitals:   04/12/16 1623 04/12/16 2100  BP: 101/76 116/68  Pulse: (!) 113 (!) 116  Resp: 18 18  Temp: 99 F (37.2 C) 99.6 F (37.6 C)    Body mass index is 19.57 kg/m.  Intake/Output Summary (Last 24 hours) at 04/12/16 2324 Last data filed at 04/12/16 1200  Gross per 24 hour  Intake              120 ml  Output                0 ml  Net              120 ml     Sclerae mild jaundice   Oropharynx clear  No peripheral adenopathy  Lungs clear -- no rales or rhonchi  Heart regular rate and rhythm  Abdomen mild tenderness   MSK no focal spinal tenderness, no peripheral edema  Neuro nonfocal    CBG (last 3)   Recent Labs  04/12/16 2100  GLUCAP 347*     Labs:  Lab Results  Component Value Date   WBC 9.4 04/10/2016   HGB 11.8 (L) 04/10/2016   HCT 36.1 (L) 04/10/2016   MCV 91.0 04/10/2016   PLT 372 04/10/2016   NEUTROABS 7.3 (H) 04/10/2016   CMP Latest Ref Rng & Units 04/12/2016 04/10/2016 03/22/2016  Glucose 65 - 99 mg/dL 222(H) 269(H) 283(H)  BUN 6 - 20 mg/dL 13 18.0 21.5  Creatinine 0.61 - 1.24 mg/dL 1.05 1.3 1.2  Sodium 135 - 145 mmol/L 133(L) 139 136  Potassium 3.5 - 5.1 mmol/L 3.6 3.9 3.9  Chloride 101 - 111 mmol/L 97(L) - -  CO2 22 - 32 mmol/L 27 30(H) 26  Calcium 8.9 - 10.3 mg/dL 8.3(L) 9.2 8.8  Total Protein 6.5 - 8.1 g/dL 5.9(L) 6.3(L) 6.0(L)  Total Bilirubin 0.3 - 1.2 mg/dL 2.8(H) 3.43(H) 7.10(HH)  Alkaline Phos 38 - 126 U/L 854(H) 891(H) 524(H)  AST 15 - 41 U/L 122(H) 107(H) 93(H)  ALT 17 - 63 U/L 149(H) 193(H) 166(H)     Urine Studies No results for input(s): UHGB, CRYS in the last 72 hours.  Invalid input(s): UACOL, UAPR, USPG, UPH, UTP, UGL, UKET, UBIL, UNIT, UROB, ULEU, UEPI, UWBC, URBC,  UBAC, CAST, UCOM, BILUA  Basic Metabolic Panel:  Recent Labs Lab 04/10/16 0935 04/12/16 0345  NA 139 133*  K 3.9 3.6  CL  --  97*  CO2 30* 27  GLUCOSE 269* 222*  BUN 18.0 13  CREATININE 1.3 1.05  CALCIUM 9.2 8.3*   GFR Estimated Creatinine Clearance: 48 mL/min (by C-G formula based on SCr of 1.05 mg/dL). Liver Function Tests:  Recent Labs Lab 04/10/16 0935 04/12/16 0345  AST 107* 122*  ALT 193* 149*  ALKPHOS 891* 854*  BILITOT 3.43* 2.8*  PROT 6.3* 5.9*  ALBUMIN 2.8* 3.0*   No results for input(s): LIPASE, AMYLASE in the last 168 hours. No results for input(s): AMMONIA in the last 168 hours. Coagulation profile  Recent Labs Lab 04/12/16 0345  INR 1.09    CBC:  Recent Labs Lab 04/10/16 0935  WBC 9.4  NEUTROABS 7.3*  HGB 11.8*  HCT 36.1*  MCV 91.0  PLT 372  Cardiac Enzymes: No results for input(s): CKTOTAL, CKMB, CKMBINDEX, TROPONINI in the last 168 hours. BNP: Invalid input(s): POCBNP CBG:  Recent Labs Lab 04/12/16 2100  GLUCAP 347*   D-Dimer No results for input(s): DDIMER in the last 72 hours. Hgb A1c No results for input(s): HGBA1C in the last 72 hours. Lipid Profile No results for input(s): CHOL, HDL, LDLCALC, TRIG, CHOLHDL, LDLDIRECT in the last 72 hours. Thyroid function studies No results for input(s): TSH, T4TOTAL, T3FREE, THYROIDAB in the last 72 hours.  Invalid input(s): FREET3 Anemia work up No results for input(s): VITAMINB12, FOLATE, FERRITIN, TIBC, IRON, RETICCTPCT in the last 72 hours. Microbiology Recent Results (from the past 240 hour(s))  Culture, Blood     Status: None (Preliminary result)   Collection Time: 04/10/16 11:48 AM  Result Value Ref Range Status   BLOOD CULTURE, ROUTINE Preliminary report  Preliminary   RESULT 1 Comment  Preliminary    Comment: No growth in 36 - 48 hours.  Culture, Blood     Status: None (Preliminary result)   Collection Time: 04/10/16 11:49 AM  Result Value Ref Range Status   BLOOD  CULTURE, ROUTINE Preliminary report  Preliminary   RESULT 1 Comment  Preliminary    Comment: No growth in 36 - 48 hours.  Urine Culture     Status: None   Collection Time: 04/10/16  1:45 PM  Result Value Ref Range Status   Urine Culture, Routine Final report  Final   Urine Culture result 1 No growth  Final      Studies:  Ct Abdomen Pelvis W Contrast  Result Date: 04/11/2016 CLINICAL DATA:  Recently diagnosed pancreatic cancer. Chemotherapy started. Fever. Increased right upper quadrant pain. Biliary stent placed 11/17. EXAM: CT ABDOMEN AND PELVIS WITH CONTRAST TECHNIQUE: Multidetector CT imaging of the abdomen and pelvis was performed using the standard protocol following bolus administration of intravenous contrast. CONTRAST:  172mL ISOVUE-300 IOPAMIDOL (ISOVUE-300) INJECTION 61% COMPARISON:  02/28/2016 all FINDINGS: Lower chest: Clear lung bases. Normal heart size without pericardial or pleural effusion. Hepatobiliary: Hepatic metastasis. Index hepatic dome lesion measures 3.1 cm in image 7/ series 7 versus 1.9 cm on the prior. An index lesion within the anterior right and medial left hepatic lobes measures 2.2 cm on image 19/series 7 versus 1.3 cm on the prior exam (when remeasured). Other lesions are new and enlarged. Borderline gallbladder distension is unchanged. Probable sludge in the gallbladder. No specific evidence of acute cholecystitis. Pneumobilia. Mild intrahepatic biliary duct dilatation, increased since the prior CT. Common duct stent originates at the level porta hepatis and terminates within been descending duodenum. The common duct measures maximally 1.6 cm on image 27/series 7 just above the stent. High-density material within the stent including on image 36/series 7. Pancreas: Pancreatic body/ tail atrophy with duct dilatation. This continues to the level of a pancreatic head/ uncinate process mass which measures 3.7 x 3.8 cm on image 45/series 7. Compare 3.5 x 2.8 cm on the  prior. No acute pancreatitis. Spleen: Old granulomatous disease in the spleen. Adrenals/Urinary Tract: Normal adrenal glands. Right renal cysts. Normal left kidney, without hydronephrosis. Mild bladder wall thickening. Stomach/Bowel: Normal colon and terminal ileum.  Normal small bowel. Vascular/Lymphatic: Aortic and branch vessel atherosclerosis. Patent portal and hepatic veins. 10 mm portal caval node is not pathologic by size criteria but is enlarged from 6 mm on the prior. Necrotic peripancreatic adenopathy including at 1.4 cm anterior to the pancreatic head on image 46/series 7. Enlarged from 7 mm  previously. No pelvic sidewall adenopathy. Reproductive: Moderate prostatomegaly. Probable dystrophic calcification within the penis. Other: No significant free fluid. No evidence of omental or peritoneal disease. Musculoskeletal: Lumbosacral spondylosis with convex right lumbar spine curvature. IMPRESSION: 1. Mild enlargement of pancreatic head/uncinate process mass and progression of hepatic metastasis. 2. Interval placement of a common duct stent. Since 02/28/2016, progression of biliary duct dilatation. Pneumobilia, arguing against complete stent obstruction. Hyperattenuation within the stent could be artifactual. Cannot exclude stones within. 3. Persistent borderline gallbladder distention without specific evidence of acute cholecystitis. 4. Progressive peripancreatic nodal metastasis. 5. Prostatomegaly with bladder wall thickening, likely secondary and related to a component of outlet obstruction. Electronically Signed   By: Abigail Miyamoto M.D.   On: 04/11/2016 08:23    Assessment: 75 y.o.  1. Abdominal pain, likely secondary to cancer progression, rule out cholangitis  2. Obstructive jaundice, improving  3. Pancreatic cancer with liver metastasis  4. Hypertension  5. History of TIA 6. Depression and anxiety  7. Deconditioning secondary to #3 8. Severe protein and calorie malnutrition    Plan -appreciate GI input -on antibiotics, cultures are negative so far -pt is a poor candidate for chemotherapy due to his limited PS, I discussed palliative care with pt's step-son, he is very interested in alternative therapy.  -I will follow up after his discharge   Plan:    Truitt Merle, MD 04/12/2016

## 2016-04-12 NOTE — Progress Notes (Signed)
[facility] Progress Note  Subjective: The patient said he feels little better than yesterday. Less abdominal pain. He is on antibiotics. He is receiving analgesics as necessary. He has metastatic pancreatic cancer to the liver and has had a recent biliary stent placed. CT scan shows pneumobilia and it is unlikely that the stent is occluded based on this.  Objective: Vital signs in last 24 hours: Temp:  [98 F (36.7 C)-98.3 F (36.8 C)] 98.3 F (36.8 C) (12/01 0620) Pulse Rate:  [65-107] 107 (12/01 0620) Resp:  [16] 16 (12/01 0620) BP: (119-152)/(90-91) 119/91 (12/01 0620) SpO2:  [96 %-100 %] 97 % (12/01 0943) Weight:  [55 kg (121 lb 4.1 oz)] 55 kg (121 lb 4.1 oz) (11/30 1156) Weight change:    PE:  No distress, he does not appear in pain  Heart regular rhythm  Abdomen soft and nontender  Lab Results: Results for orders placed or performed during the hospital encounter of 04/11/16 (from the past 24 hour(s))  Comprehensive metabolic panel     Status: Abnormal   Collection Time: 04/12/16  3:45 AM  Result Value Ref Range   Sodium 133 (L) 135 - 145 mmol/L   Potassium 3.6 3.5 - 5.1 mmol/L   Chloride 97 (L) 101 - 111 mmol/L   CO2 27 22 - 32 mmol/L   Glucose, Bld 222 (H) 65 - 99 mg/dL   BUN 13 6 - 20 mg/dL   Creatinine, Ser 1.05 0.61 - 1.24 mg/dL   Calcium 8.3 (L) 8.9 - 10.3 mg/dL   Total Protein 5.9 (L) 6.5 - 8.1 g/dL   Albumin 3.0 (L) 3.5 - 5.0 g/dL   AST 122 (H) 15 - 41 U/L   ALT 149 (H) 17 - 63 U/L   Alkaline Phosphatase 854 (H) 38 - 126 U/L   Total Bilirubin 2.8 (H) 0.3 - 1.2 mg/dL   GFR calc non Af Amer >60 >60 mL/min   GFR calc Af Amer >60 >60 mL/min   Anion gap 9 5 - 15  Protime-INR     Status: None   Collection Time: 04/12/16  3:45 AM  Result Value Ref Range   Prothrombin Time 14.1 11.4 - 15.2 seconds   INR 1.09     Studies/Results: No results found.    Assessment: Metastatic pancreatic cancer to the liver  Plan:   Continue supportive  care with IV antibiotics and analgesics as needed. Advance diet. No plans at this time for any specific GI intervention, but we'll follow clinically.    Cassell Clement 04/12/2016, 10:46 AM  Pager: [phone] If no answer or after 5 PM call [phone]

## 2016-04-12 NOTE — Care Management Obs Status (Signed)
[place] NOTIFICATION   Patient Details  Name: Reginald Ochoa MRN: [id number] Date of Birth: [birth year]   Medicare Observation Status Notification Given:  Yes    Lynnell Catalan, RN 04/12/2016, 1:49 PM

## 2016-04-13 DIAGNOSIS — E43 Unspecified severe protein-calorie malnutrition: Secondary | ICD-10-CM

## 2016-04-13 LAB — COMPREHENSIVE METABOLIC PANEL
ALT: 127 U/L — ABNORMAL HIGH (ref 17–63)
ANION GAP: 9 (ref 5–15)
AST: 62 U/L — ABNORMAL HIGH (ref 15–41)
Albumin: 3.2 g/dL — ABNORMAL LOW (ref 3.5–5.0)
Alkaline Phosphatase: 793 U/L — ABNORMAL HIGH (ref 38–126)
BILIRUBIN TOTAL: 2.3 mg/dL — AB (ref 0.3–1.2)
BUN: 14 mg/dL (ref 6–20)
CO2: 28 mmol/L (ref 22–32)
Calcium: 8.5 mg/dL — ABNORMAL LOW (ref 8.9–10.3)
Chloride: 97 mmol/L — ABNORMAL LOW (ref 101–111)
Creatinine, Ser: 1.08 mg/dL (ref 0.61–1.24)
GFR calc Af Amer: >60 mL/min (ref >60)
Glucose, Bld: 205 mg/dL — ABNORMAL HIGH (ref 65–99)
POTASSIUM: 3.5 mmol/L (ref 3.5–5.1)
Sodium: 134 mmol/L — ABNORMAL LOW (ref 135–145)
TOTAL PROTEIN: 6.1 g/dL — AB (ref 6.5–8.1)

## 2016-04-13 MED ORDER — ASPIRIN EC 81 MG PO TBEC: 81.0000 mg | DELAYED_RELEASE_TABLET | Freq: Every day | ORAL | 0 refills | Status: DC

## 2016-04-13 MED ORDER — PRO-STAT SUGAR FREE PO LIQD: 30.0000 mL | Freq: Three times a day (TID) | ORAL | 0 refills | Status: DC

## 2016-04-13 MED ORDER — AMLODIPINE BESYLATE 10 MG PO TABS: 10.0000 mg | ORAL_TABLET | Freq: Every day | ORAL | 0 refills | Status: DC

## 2016-04-13 MED ORDER — POLYETHYLENE GLYCOL 3350 17 G PO PACK: 17.0000 g | PACK | Freq: Every day | ORAL | 0 refills | Status: DC | PRN

## 2016-04-13 MED ORDER — POLYETHYLENE GLYCOL 3350 17 G PO PACK
17.0000 g | PACK | Freq: Every day | ORAL | Status: DC | PRN
  Administered 2016-04-13: 17 g via ORAL
  Filled 2016-04-13: qty 1

## 2016-04-13 MED ORDER — HYDROCODONE-ACETAMINOPHEN 10-325 MG PO TABS: 1.0000 | ORAL_TABLET | Freq: Three times a day (TID) | ORAL | 0 refills | Status: AC | PRN

## 2016-04-13 NOTE — Progress Notes (Signed)
[facility] PROGRESS NOTE Subjective Pt feels better No BM for several days  Objective: Vital signs in last 24 hours: Temp:  [98.1 F (36.7 C)-99.6 F (37.6 C)] 98.1 F (36.7 C) (12/02 0635) Pulse Rate:  [92-116] 92 (12/02 0635) Resp:  [16-18] 16 (12/02 0635) BP: (101-124)/(68-88) 124/88 (12/02 0635) SpO2:  [96 %-100 %] 96 % (12/02 0635) Last BM Date: 04/11/16  Intake/Output from previous day: 12/01 0701 - 12/02 0700 In: 330 [P.O.:330] Out: 7 [Urine:7] Intake/Output this shift: No intake/output data recorded.  PE: General--alert, NAD  Abdomen--soft, good BSs minimal tender  Lab Results:  Recent Labs  04/10/16 0935  WBC 9.4  HGB 11.8*  HCT 36.1*  PLT 372   BMET  Recent Labs  04/10/16 0935 04/12/16 0345 04/13/16 0407  NA 139 133* 134*  K 3.9 3.6 3.5  CL  --  97* 97*  CO2 30* 27 28  CREATININE 1.3 1.05 1.08   LFT  Recent Labs  04/10/16 0935 04/12/16 0345 04/13/16 0407  PROT 6.3* 5.9* 6.1*  AST 107* 122* 62*  ALT 193* 149* 127*  ALKPHOS 891* 854* 793*  BILITOT 3.43* 2.8* 2.3*   PT/INR  Recent Labs  04/12/16 0345  LABPROT 14.1  INR 1.09   PANCREAS No results for input(s): LIPASE in the last 72 hours.       Studies/Results: Ct Abdomen Pelvis W Contrast  Result Date: 04/11/2016 CLINICAL DATA:  Recently diagnosed pancreatic cancer. Chemotherapy started. Fever. Increased right upper quadrant pain. Biliary stent placed 11/17. EXAM: CT ABDOMEN AND PELVIS WITH CONTRAST TECHNIQUE: Multidetector CT imaging of the abdomen and pelvis was performed using the standard protocol following bolus administration of intravenous contrast. CONTRAST:  133mL ISOVUE-300 IOPAMIDOL (ISOVUE-300) INJECTION 61% COMPARISON:  02/28/2016 all FINDINGS: Lower chest: Clear lung bases. Normal heart size without pericardial or pleural effusion. Hepatobiliary: Hepatic metastasis. Index hepatic dome lesion measures 3.1 cm in image 7/ series 7 versus 1.9 cm on the  prior. An index lesion within the anterior right and medial left hepatic lobes measures 2.2 cm on image 19/series 7 versus 1.3 cm on the prior exam (when remeasured). Other lesions are new and enlarged. Borderline gallbladder distension is unchanged. Probable sludge in the gallbladder. No specific evidence of acute cholecystitis. Pneumobilia. Mild intrahepatic biliary duct dilatation, increased since the prior CT. Common duct stent originates at the level porta hepatis and terminates within been descending duodenum. The common duct measures maximally 1.6 cm on image 27/series 7 just above the stent. High-density material within the stent including on image 36/series 7. Pancreas: Pancreatic body/ tail atrophy with duct dilatation. This continues to the level of a pancreatic head/ uncinate process mass which measures 3.7 x 3.8 cm on image 45/series 7. Compare 3.5 x 2.8 cm on the prior. No acute pancreatitis. Spleen: Old granulomatous disease in the spleen. Adrenals/Urinary Tract: Normal adrenal glands. Right renal cysts. Normal left kidney, without hydronephrosis. Mild bladder wall thickening. Stomach/Bowel: Normal colon and terminal ileum.  Normal small bowel. Vascular/Lymphatic: Aortic and branch vessel atherosclerosis. Patent portal and hepatic veins. 10 mm portal caval node is not pathologic by size criteria but is enlarged from 6 mm on the prior. Necrotic peripancreatic adenopathy including at 1.4 cm anterior to the pancreatic head on image 46/series 7. Enlarged from 7 mm previously. No pelvic sidewall adenopathy. Reproductive: Moderate prostatomegaly. Probable dystrophic calcification within the penis. Other: No significant free fluid. No evidence of omental or peritoneal disease. Musculoskeletal: Lumbosacral spondylosis with convex right lumbar spine curvature.  IMPRESSION: 1. Mild enlargement of pancreatic head/uncinate process mass and progression of hepatic metastasis. 2. Interval placement of a common duct  stent. Since 02/28/2016, progression of biliary duct dilatation. Pneumobilia, arguing against complete stent obstruction. Hyperattenuation within the stent could be artifactual. Cannot exclude stones within. 3. Persistent borderline gallbladder distention without specific evidence of acute cholecystitis. 4. Progressive peripancreatic nodal metastasis. 5. Prostatomegaly with bladder wall thickening, likely secondary and related to a component of outlet obstruction. Electronically Signed   By: Abigail Miyamoto M.D.   On: 04/11/2016 08:23    Medications: I have reviewed the patient's current medications.  Assessment/Plan: 1. Abd Pain. Doubt due to cholangitis with continuing improvement in LFTs. C/o constipation.  Will add miralax PRN Will sign off, please call us if needed.   Keidrick Murty JR,Yue Glasheen L 04/13/2016, 7:34 AM  This note was created using voice recognition software. Minor errors may Have occurred unintentionally.  Pager: [phone] If no answer or after hours call [phone]

## 2016-04-13 NOTE — Progress Notes (Signed)
Patient discharged to home with family, discharge instructions reviewed with patient and wife who verbalized understanding. New RX's given to patient

## 2016-04-13 NOTE — Discharge Summary (Signed)
Physician Discharge Summary  Reginald Ochoa [id number] DOB: [birth year] DOA: 04/11/2016  PCP: Elyn Peers, MD  Admit date: 04/11/2016 Discharge date: 04/13/2016  Time spent: 35 minutes  Recommendations for Outpatient Follow-up:  1. Repeat CMET to follow electrolytes renal function and LFT's 2. Reassess BP and adjust medications as needed    Discharge Diagnoses:  Principal Problem:   RUQ abdominal pain Active Problems:   Hypertension   Pancreatic cancer metastasized to liver ([place])   Unintentional weight loss   Transaminitis   Elevated alkaline phosphatase level   Abdominal pain   Severe protein-calorie malnutrition ([place])   Discharge Condition: stable and improved. Will discharge home with instructions to follow up with oncology and GI as previously instructed/scheduled.  Diet recommendation: regular diet   Filed Weights   04/11/16 1156  Weight: 55 kg (121 lb 4.1 oz)    History of present illness:  75 y.o.malewith medical history significant of metastatic pancreatic cancer, hypertension, tobacco abuse, alcohol abuse, transaminitis, s/p biliary stent. Patient reports a 4 day history of worsening right upper quadrant pain. He describes it as an intermittent dull ache that is relieved with Norco. Nothing has aggravated his symptoms. He underwent biliary stent placement for obstruction from pancreatic mass earlier this month. He was seen yesterday by his oncologist and was started on ceftriaxone for prophylaxis. He returned today for a CT scan which was significant for worsening necrotic pancreatic adenopathy, persistent gallbladder distension w/o cholecystitis, bladder wall thickening possibly related to some outlet obstruction.  Hospital Course:  RUQ abdominal pain -Likely related to liver infiltration of metastatic pancreatic cancer and constipation .  -Currently better and tolerating diet/feeding supplements. -Do not feel symptoms are secondary to his stent, base on  CT results;, as stent is patent and no bulk of obstruction or congestion appreciated.  -GI doubt cholangitis component; patient has remained afebrile and with normal WBC's. Also with continue improvement in his LFT's. -recommendations are to continue advancing diet and to keep himself well hydrated. No abx's recommended.  Elevated transaminases Elevated alkaline phosphatase S/P Biliary stent -AST and ALT Worsened from about two weeks ago on admission.  -Bilirubin improved. -INR stable  -could be secondary to malignancy itself (more spreading into liver appreciated on images) -at discharge improved after hydration  Pancreatic cancer with liver metastasis -Followed by Dr. Burr Medico.  -He is s/p one cycle of chemotherapy on 03/15/2016. -will have discussion regaridng further treatment option -prognosis is poor -will continue pancrease   Hypertension -to prevent dehydration and electrolytes abnormalities given poor intake -will d/c diovan and HCTZ -discharge on amlodipine  -watch blood pressures and adjust medications as needed   History of TIA -Patient currently was not on scheduled antiplatelet therapy.  -will continue using baby aspirin (mainly use for HA's according to patient)  Depression and Anxiety -continue Prozac and xanax  -no hallucinations, no SI  Asthma -Continue bronchodilators -no wheezing   Severe protein calorie malnutrition -will follow rec's for feeding supplementation as dictated by nutritional service   Procedures:  See below for x-ray reports   Consultations:  GI  Oncology   Discharge Exam: Vitals:   04/12/16 2100 04/13/16 0635  BP: 116/68 124/88  Pulse: (!) 116 92  Resp: 18 16  Temp: 99.6 F (37.6 C) 98.1 F (36.7 C)    General:  Afebrile, reports some improvement in his abd pain; also reported improvement in nausea and denies vomiting. Patient had BM. Less icteric today and will like to go home. Patient is frail and  significantly  underweight.  Cardiovascular: S1 and Se, no rubs  Respiratory: CTA bilaterally  Abdomen: positive BS, mild guarding, no distension   Musculoskeletal: no edema or cyanosis    Discharge Instructions   Discharge Instructions    Discharge instructions    Complete by:  As directed    Take medications as prescribed Maintain adequate hydration Follow up with Dr. Burr Medico for further discussion and treatment alternative for your pancreatic malignancy Follow with Dr. Cristina Gong as previously instructed/scheduled     Current Discharge Medication List    START taking these medications   Details  Amino Acids-Protein Hydrolys (FEEDING SUPPLEMENT, PRO-STAT SUGAR FREE 64,) LIQD Take 30 mLs by mouth 3 (three) times daily. Qty: 900 mL, Refills: 0    aspirin EC 81 MG tablet Take 1 tablet (81 mg total) by mouth daily. Qty: 30 tablet, Refills: 0    polyethylene glycol (MIRALAX / GLYCOLAX) packet Take 17 g by mouth daily as needed for mild constipation. Qty: 28 each, Refills: 0      CONTINUE these medications which have CHANGED   Details  amLODipine (NORVASC) 10 MG tablet Take 1 tablet (10 mg total) by mouth daily. Qty: 30 tablet, Refills: 0    HYDROcodone-acetaminophen (NORCO) 10-325 MG tablet Take 1 tablet by mouth every 8 (eight) hours as needed for severe pain. Qty: 30 tablet, Refills: 0      CONTINUE these medications which have NOT CHANGED   Details  albuterol (PROVENTIL HFA;VENTOLIN HFA) 108 (90 BASE) MCG/ACT inhaler Inhale 2 puffs into the lungs every 6 (six) hours as needed for wheezing or shortness of breath.    ALPRAZolam (XANAX) 1 MG tablet Take 0.5-1 mg by mouth 2 (two) times daily as needed for anxiety. Anxiety     amphetamine-dextroamphetamine (ADDERALL) 10 MG tablet Take 5-10 mg by mouth daily with breakfast. 10mg  in the morning and 5 mg at lunch    budesonide-formoterol (SYMBICORT) 160-4.5 MCG/ACT inhaler Inhale 2 puffs into the lungs 2 (two) times daily.    cetirizine  (ZYRTEC) 10 MG tablet Take 10 mg by mouth at bedtime.    cholecalciferol (VITAMIN D) 1000 units tablet Take 1,000 Units by mouth daily.    FLUoxetine (PROZAC) 40 MG capsule Take 40 mg by mouth daily with breakfast.      LUTEIN PO Take 1 tablet by mouth daily.    Multiple Vitamins-Minerals (MULTIVITAMIN WITH MINERALS) tablet Take 1 tablet by mouth daily.     ondansetron (ZOFRAN ODT) 8 MG disintegrating tablet Take 1 tablet (8 mg total) by mouth every 8 (eight) hours as needed for nausea or vomiting. Qty: 30 tablet, Refills: 1    OVER THE COUNTER MEDICATION Place 2 sprays into both nostrils as needed (for congestion). [facility] Nasal Spray for congestion    Pancrelipase, Lip-Prot-Amyl, (CREON) 24000-76000 units CPEP Take 1 capsule (24,000 Units total) by mouth 3 (three) times daily with meals. Qty: 90 capsule, Refills: 1    prochlorperazine (COMPAZINE) 10 MG tablet Take 1 tablet (10 mg total) by mouth every 6 (six) hours as needed (Nausea or vomiting). Qty: 30 tablet, Refills: 1   Associated Diagnoses: Pancreatic cancer metastasized to liver ([place])    senna-docusate (SENOKOT-S) 8.6-50 MG tablet Take 1 tablet by mouth at bedtime as needed for mild constipation. Qty: 14 tablet, Refills: 0      STOP taking these medications     aspirin 325 MG tablet      guaiFENesin (MUCINEX) 600 MG 12 hr tablet  valsartan-hydrochlorothiazide (DIOVAN-HCT) 80-12.5 MG tablet        No Known Allergies Follow-up Information    Truitt Merle, MD. Call today.   Specialties:  Hematology, Oncology Why:  office for appointment details  Contact information: [place] [address] [phone]           The results of significant diagnostics from this hospitalization (including imaging, microbiology, ancillary and laboratory) are listed below for reference.    Significant Diagnostic Studies: Ct Abdomen Pelvis W Contrast  Result Date: 04/11/2016 CLINICAL DATA:  Recently diagnosed  pancreatic cancer. Chemotherapy started. Fever. Increased right upper quadrant pain. Biliary stent placed 11/17. EXAM: CT ABDOMEN AND PELVIS WITH CONTRAST TECHNIQUE: Multidetector CT imaging of the abdomen and pelvis was performed using the standard protocol following bolus administration of intravenous contrast. CONTRAST:  123mL ISOVUE-300 IOPAMIDOL (ISOVUE-300) INJECTION 61% COMPARISON:  02/28/2016 all FINDINGS: Lower chest: Clear lung bases. Normal heart size without pericardial or pleural effusion. Hepatobiliary: Hepatic metastasis. Index hepatic dome lesion measures 3.1 cm in image 7/ series 7 versus 1.9 cm on the prior. An index lesion within the anterior right and medial left hepatic lobes measures 2.2 cm on image 19/series 7 versus 1.3 cm on the prior exam (when remeasured). Other lesions are new and enlarged. Borderline gallbladder distension is unchanged. Probable sludge in the gallbladder. No specific evidence of acute cholecystitis. Pneumobilia. Mild intrahepatic biliary duct dilatation, increased since the prior CT. Common duct stent originates at the level porta hepatis and terminates within been descending duodenum. The common duct measures maximally 1.6 cm on image 27/series 7 just above the stent. High-density material within the stent including on image 36/series 7. Pancreas: Pancreatic body/ tail atrophy with duct dilatation. This continues to the level of a pancreatic head/ uncinate process mass which measures 3.7 x 3.8 cm on image 45/series 7. Compare 3.5 x 2.8 cm on the prior. No acute pancreatitis. Spleen: Old granulomatous disease in the spleen. Adrenals/Urinary Tract: Normal adrenal glands. Right renal cysts. Normal left kidney, without hydronephrosis. Mild bladder wall thickening. Stomach/Bowel: Normal colon and terminal ileum.  Normal small bowel. Vascular/Lymphatic: Aortic and branch vessel atherosclerosis. Patent portal and hepatic veins. 10 mm portal caval node is not pathologic by size  criteria but is enlarged from 6 mm on the prior. Necrotic peripancreatic adenopathy including at 1.4 cm anterior to the pancreatic head on image 46/series 7. Enlarged from 7 mm previously. No pelvic sidewall adenopathy. Reproductive: Moderate prostatomegaly. Probable dystrophic calcification within the penis. Other: No significant free fluid. No evidence of omental or peritoneal disease. Musculoskeletal: Lumbosacral spondylosis with convex right lumbar spine curvature. IMPRESSION: 1. Mild enlargement of pancreatic head/uncinate process mass and progression of hepatic metastasis. 2. Interval placement of a common duct stent. Since 02/28/2016, progression of biliary duct dilatation. Pneumobilia, arguing against complete stent obstruction. Hyperattenuation within the stent could be artifactual. Cannot exclude stones within. 3. Persistent borderline gallbladder distention without specific evidence of acute cholecystitis. 4. Progressive peripancreatic nodal metastasis. 5. Prostatomegaly with bladder wall thickening, likely secondary and related to a component of outlet obstruction. Electronically Signed   By: Abigail Miyamoto M.D.   On: 04/11/2016 08:23   US Abdomen Limited  Result Date: 03/25/2016 CLINICAL DATA:  Rule out biliary obstruction. Worsening jaundice. Liver metastasis from pancreatic cancer. EXAM: US ABDOMEN LIMITED - RIGHT UPPER QUADRANT COMPARISON:  CT of 02/28/2016 FINDINGS: Gallbladder: Moderate moderate gallbladder sludge. No pericholecystic fluid or wall thickening. Gallbladder distension, including at 12.4 cm. Sonographic Murphy's sign was  not elicited. Common bile duct: Diameter: Moderate to markedly dilated, 19 mm. This is new. Echogenic material within is likely due to sludge. Liver: Hepatic metastasis, as on prior CT. Example within the right lobe of the liver at 1.7 cm. Intrahepatic ductal dilatation. IMPRESSION: 1. Development of moderate to marked biliary duct dilatation with common duct  sludge. 2. Gallbladder sludge without acute cholecystitis. Gallbladder dilatation is also likely due to biliary obstruction. 3. Hepatic metastasis. Electronically Signed   By: Abigail Miyamoto M.D.   On: 03/25/2016 12:32   Dg Ercp Biliary & Pancreatic Ducts  Result Date: 03/29/2016 CLINICAL DATA:  Pancreatic carcinoma. Jaundice. Biliary obstruction. EXAM: ERCP TECHNIQUE: Multiple spot images obtained with the fluoroscopic device and submitted for interpretation post-procedure. COMPARISON:  CT on 02/28/2016 FINDINGS: ERCP performed by Dr. Watt Climes. Images show an abrupt stricture in the proximal to mid common bile duct. There is marked dilatation of the proximal common duct as well as moderate dilatation of visualized intrahepatic bile ducts. Subsequent images show placement of a wall stent in the common bile duct, with a persistent waist in the mid to distal common bile duct. IMPRESSION: Abrupt stricture in the proximal to mid common bile duct, with moderate proximal biliary ductal dilatation. This is consistent with stricture due to known pancreatic carcinoma. Successful placement of biliary stent in the common bile duct. These images were submitted for radiologic interpretation only. Please see the procedural report for the amount of contrast and the fluoroscopy time utilized. Electronically Signed   By: Earle Gell M.D.   On: 03/29/2016 10:53    Microbiology: Recent Results (from the past 240 hour(s))  Culture, Blood     Status: None (Preliminary result)   Collection Time: 04/10/16 11:48 AM  Result Value Ref Range Status   BLOOD CULTURE, ROUTINE Preliminary report  Preliminary   RESULT 1 Comment  Preliminary    Comment: No growth in 36 - 48 hours.  Culture, Blood     Status: None (Preliminary result)   Collection Time: 04/10/16 11:49 AM  Result Value Ref Range Status   BLOOD CULTURE, ROUTINE Preliminary report  Preliminary   RESULT 1 Comment  Preliminary    Comment: No growth in 36 - 48 hours.   Urine Culture     Status: None   Collection Time: 04/10/16  1:45 PM  Result Value Ref Range Status   Urine Culture, Routine Final report  Final   Urine Culture result 1 No growth  Final     Labs: Basic Metabolic Panel:  Recent Labs Lab 04/10/16 0935 04/12/16 0345 04/13/16 0407  NA 139 133* 134*  K 3.9 3.6 3.5  CL  --  97* 97*  CO2 30* 27 28  GLUCOSE 269* 222* 205*  BUN 18.0 13 14  CREATININE 1.3 1.05 1.08  CALCIUM 9.2 8.3* 8.5*   Liver Function Tests:  Recent Labs Lab 04/10/16 0935 04/12/16 0345 04/13/16 0407  AST 107* 122* 62*  ALT 193* 149* 127*  ALKPHOS 891* 854* 793*  BILITOT 3.43* 2.8* 2.3*  PROT 6.3* 5.9* 6.1*  ALBUMIN 2.8* 3.0* 3.2*   CBC:  Recent Labs Lab 04/10/16 0935  WBC 9.4  NEUTROABS 7.3*  HGB 11.8*  HCT 36.1*  MCV 91.0  PLT 372   CBG:  Recent Labs Lab 04/12/16 2100  GLUCAP 347*    Signed:  Barton Dubois MD.  [facility] 04/13/2016, 5:30 PM

## 2016-04-15 MED FILL — AMLODIPINE BESYLATE 10 MG T: 10 | 30 days supply | Qty: 30 | Fill #0

## 2016-04-16 LAB — CULTURE, BLOOD (SINGLE)

## 2016-04-17 ENCOUNTER — Ambulatory Visit (HOSPITAL_BASED_OUTPATIENT_CLINIC_OR_DEPARTMENT_OTHER): Payer: Medicare Other

## 2016-04-17 ENCOUNTER — Other Ambulatory Visit (HOSPITAL_BASED_OUTPATIENT_CLINIC_OR_DEPARTMENT_OTHER): Payer: Medicare Other

## 2016-04-17 ENCOUNTER — Ambulatory Visit (HOSPITAL_BASED_OUTPATIENT_CLINIC_OR_DEPARTMENT_OTHER): Payer: Medicare Other | Admitting: Hematology

## 2016-04-17 ENCOUNTER — Encounter: Payer: Self-pay | Admitting: Hematology

## 2016-04-17 VITALS — BP 125/83 | HR 90 | Temp 98.3°F | Resp 18 | Ht 66.0 in | Wt 119.5 lb

## 2016-04-17 DIAGNOSIS — R634 Abnormal weight loss: Secondary | ICD-10-CM

## 2016-04-17 DIAGNOSIS — R110 Nausea: Secondary | ICD-10-CM

## 2016-04-17 DIAGNOSIS — F329 Major depressive disorder, single episode, unspecified: Secondary | ICD-10-CM

## 2016-04-17 DIAGNOSIS — C250 Malignant neoplasm of head of pancreas: Secondary | ICD-10-CM

## 2016-04-17 DIAGNOSIS — C787 Secondary malignant neoplasm of liver and intrahepatic bile duct: Secondary | ICD-10-CM

## 2016-04-17 DIAGNOSIS — R1013 Epigastric pain: Secondary | ICD-10-CM | POA: Diagnosis not present

## 2016-04-17 DIAGNOSIS — C259 Malignant neoplasm of pancreas, unspecified: Secondary | ICD-10-CM

## 2016-04-17 DIAGNOSIS — R530 Neoplastic (malignant) related fatigue: Secondary | ICD-10-CM

## 2016-04-17 DIAGNOSIS — F1021 Alcohol dependence, in remission: Secondary | ICD-10-CM

## 2016-04-17 DIAGNOSIS — Z5111 Encounter for antineoplastic chemotherapy: Secondary | ICD-10-CM | POA: Diagnosis not present

## 2016-04-17 DIAGNOSIS — Z7189 Other specified counseling: Secondary | ICD-10-CM | POA: Insufficient documentation

## 2016-04-17 DIAGNOSIS — Z720 Tobacco use: Secondary | ICD-10-CM

## 2016-04-17 DIAGNOSIS — I10 Essential (primary) hypertension: Secondary | ICD-10-CM

## 2016-04-17 DIAGNOSIS — F418 Other specified anxiety disorders: Secondary | ICD-10-CM

## 2016-04-17 LAB — CBC WITH DIFFERENTIAL/PLATELET
BASO%: 0.5 % (ref 0.0–2.0)
BASOS ABS: 0.1 10*3/uL (ref 0.0–0.1)
EOS%: 1 % (ref 0.0–7.0)
Eosinophils Absolute: 0.1 10*3/uL (ref 0.0–0.5)
HEMATOCRIT: 37.6 % — AB (ref 38.4–49.9)
HGB: 12.4 g/dL — ABNORMAL LOW (ref 13.0–17.1)
LYMPH#: 1.7 10*3/uL (ref 0.9–3.3)
LYMPH%: 14.9 % (ref 14.0–49.0)
MCH: 30.2 pg (ref 27.2–33.4)
MCHC: 33 g/dL (ref 32.0–36.0)
MCV: 91.5 fL (ref 79.3–98.0)
MONO#: 0.7 10*3/uL (ref 0.1–0.9)
MONO%: 6 % (ref 0.0–14.0)
NEUT#: 9 10*3/uL — ABNORMAL HIGH (ref 1.5–6.5)
NEUT%: 77.6 % — AB (ref 39.0–75.0)
Platelets: 304 10*3/uL (ref 140–400)
RBC: 4.11 10*6/uL — AB (ref 4.20–5.82)
RDW: 15.1 % — ABNORMAL HIGH (ref 11.0–14.6)
WBC: 11.6 10*3/uL — ABNORMAL HIGH (ref 4.0–10.3)

## 2016-04-17 LAB — COMPREHENSIVE METABOLIC PANEL
ALT: 107 U/L — AB (ref 0–55)
AST: 50 U/L — AB (ref 5–34)
Albumin: 3 g/dL — ABNORMAL LOW (ref 3.5–5.0)
Alkaline Phosphatase: 751 U/L — ABNORMAL HIGH (ref 40–150)
Anion Gap: 10 mEq/L (ref 3–11)
BUN: 25.4 mg/dL (ref 7.0–26.0)
CALCIUM: 9.1 mg/dL (ref 8.4–10.4)
CHLORIDE: 96 meq/L — AB (ref 98–109)
CO2: 30 meq/L — AB (ref 22–29)
CREATININE: 1.2 mg/dL (ref 0.7–1.3)
EGFR: 58 mL/min/{1.73_m2} — ABNORMAL LOW (ref >90)
GLUCOSE: 361 mg/dL — AB (ref 70–140)
POTASSIUM: 4.3 meq/L (ref 3.5–5.1)
SODIUM: 137 meq/L (ref 136–145)
Total Bilirubin: 2.29 mg/dL — ABNORMAL HIGH (ref 0.20–1.20)
Total Protein: 6.5 g/dL (ref 6.4–8.3)

## 2016-04-17 MED ORDER — HYDROCODONE-ACETAMINOPHEN 5-325 MG PO TABS
ORAL_TABLET | ORAL | Status: AC
  Filled 2016-04-17: qty 1

## 2016-04-17 MED ORDER — SODIUM CHLORIDE 0.9 % IV SOLN
Freq: Once | INTRAVENOUS | Status: AC
  Administered 2016-04-17: 15:00:00 via INTRAVENOUS

## 2016-04-17 MED ORDER — HYDROCODONE-ACETAMINOPHEN 5-325 MG PO TABS
1.0000 | ORAL_TABLET | Freq: Once | ORAL | Status: AC
  Administered 2016-04-17: 1 via ORAL

## 2016-04-17 MED ORDER — TRAMADOL HCL 50 MG PO TABS: 50.0000 mg | ORAL_TABLET | Freq: Four times a day (QID) | ORAL | 0 refills | Status: DC | PRN

## 2016-04-17 MED ORDER — TRAMADOL HCL 50 MG PO TABS: 50.0000 mg | ORAL_TABLET | Freq: Two times a day (BID) | ORAL | 0 refills | Status: AC | PRN

## 2016-04-17 MED ORDER — PROCHLORPERAZINE MALEATE 10 MG PO TABS
ORAL_TABLET | ORAL | Status: AC
  Filled 2016-04-17: qty 1

## 2016-04-17 MED ORDER — SODIUM CHLORIDE 0.9 % IV SOLN
800.0000 mg/m2 | Freq: Once | INTRAVENOUS | Status: AC
  Administered 2016-04-17: 1330 mg via INTRAVENOUS
  Filled 2016-04-17: qty 34.98

## 2016-04-17 MED ORDER — PROCHLORPERAZINE MALEATE 10 MG PO TABS
10.0000 mg | ORAL_TABLET | Freq: Once | ORAL | Status: AC
  Administered 2016-04-17: 10 mg via ORAL

## 2016-04-17 MED FILL — POLYETHYLENE GLYCOL 3350 PO: 31 days supply | Qty: 527 | Fill #0

## 2016-04-17 MED FILL — traMADol HCL 50 MG TABS: 50 | 15 days supply | Qty: 30 | Fill #0

## 2016-04-17 MED FILL — HYDROCODON-APAP 10-325: 10-325 | 10 days supply | Qty: 30 | Fill #0

## 2016-04-17 NOTE — Progress Notes (Signed)
Reginald Ochoa  [phone] [phone]) [phone]  Clinic Follow Up Note   Patient Care Team: Reginald Ochoa Lei, MD as PCP - General (Family Medicine) 04/17/2016    CHIEF COMPLAINTS:  Follow up metastatic pancreatic adenocarcinoma  Oncology History   Pancreatic cancer metastasized to liver [facility])   Staging form: Pancreas, AJCC 7th Edition   - Clinical stage from 03/01/2016: Stage IV (T2, N1, M1) - Signed by Reginald Merle, MD on 03/07/2016      Pancreatic cancer metastasized to liver [facility])   02/28/2016 - 03/01/2016 Hospital Admission    Pt presented with syncope and diarrhea. c-diff (-), he underwent liver biopsy and CT scans, and was discharged home.       03/01/2016 Initial Diagnosis    Pancreatic cancer metastasized to liver ([place])      03/01/2016 Initial Biopsy    Liver biopsy showed adenocarcinoma, features are consistent with metastatic adenocarcinoma      03/01/2016 Imaging    CT chest, abdomen and pelvis with contrast showed hyperenhancing mass in the head and uncinate process of the pancreas, with small nodular density anterior to the mass is concerning for cellulitis lesions, multiple hepatic hypodensities lesions most compatible with metastatic disease.      03/01/2016 Tumor Marker    CEA 10.5, CA19.9 2         HISTORY OF PRESENTING ILLNESS (03/08/2016):  Reginald Ochoa 75 y.o. male with past medical history of hypertension, asthma, depression, anxiety, and alcohol abuse, is here because of His recent diagnosed metastatic pancreatic adenocarcinoma. He is accompanied by his wife Reginald Ochoa and son Reginald Ochoa to my clinic today.  He has been having mild diarrhea for the past 2-3 weeks, lose stool, 3-5 times a day. 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 He may lost consciousness for a short period time. He was found by EMS which happened to be there, and was send to ED.He was found to have positive orthostatic vital signs  in ED. CT abdomen/pelvis showed possible pancreatic cancerwith possible liver metastasis. He was admitted to the hospital, underwent liver biopsy, and was discharged home the next day.   He has no nausea, no pain, diarrhea improved lately,  2-3 times a day, he has mild fatigue, appetite is good. 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 He is able to do all his ADLs, but not much housework or other activities.   He had three TIA, which affected his vision, he does not see well in the dark. He lives with his wife.  He has been on oxycodone since 1998 for head and neck pain since he had fall and neck injury.  he used to drink alcohol heavily, but has stopped 20 years ago. He has history of heavy smoking, still smokes half pack cigarettes a day.  CURRENT THERAPY: First line chemotherapy weekly gemcitabine 1000mg /m2, 2 weeks on, one-week off, started on 04/11/2016, second dose was postponed to 04/17/2016 due to obstructive jaundice and hospitalization  Interval History: [name] returns for follow up. He was seen by our symptom management clinic last week for low grade fever, worsening abdominal pain, anorexia, and was admitted to hospital for a few days. He was initially treated with antibiotics for presumed sepsis, ID workup was negative, he was seen by GI in the hospital, and discharged home without antibiotics last weekend. He overall feels better this week, with better energy, and appetite. He still quite  fatigued, able to take care of himself, does minimal activities, able to walk for short distance, but sometimes feels wobbly, no fall. No more fever or chill.   MEDICAL HISTORY:  Past Medical History:  Diagnosis Date  . Alcohol abuse   . Asthma   . Cancer [facility])    pancreatic with liver lesions  . Complication of anesthesia    PMH: hard to put to sleep  . Depression   . Depression with anxiety   . GERD (gastroesophageal reflux disease)     . GIB (gastrointestinal bleeding)   . Headache   . Heartburn   . Hypertension   . Neuromuscular disorder ([place])    pinched nerve  . Pancreatic mass   . Pneumonia   . TIA (transient ischemic attack)   . Tobacco abuse     SURGICAL HISTORY: Past Surgical History:  Procedure Laterality Date  . bleeding ulcer    . CATARACT EXTRACTION    . COLONOSCOPY    . ERCP N/A 03/29/2016   Procedure: ENDOSCOPIC RETROGRADE CHOLANGIOPANCREATOGRAPHY (ERCP);  Surgeon: Clarene Essex, MD;  Location: [facility] ENDOSCOPY;  Service: Endoscopy;  Laterality: N/A;  . MULTIPLE TOOTH EXTRACTIONS    . TONSILLECTOMY      SOCIAL HISTORY: Social History   Social History  . Marital status: Married    Spouse name: Reginald Ochoa  . Number of children: 3  . Years of education: N/A   Occupational History  . Author     Architect   Social History Main Topics  . Smoking status: Current Every Day Smoker    Packs/day: 0.50    Years: 40.00    Types: Cigarettes, Cigars  . Smokeless tobacco: Never Used  . Alcohol use Yes     Comment: used to be a heavy drinker and stopped 20 years ago    . Drug use: No  . Sexual activity: Not on file   Other Topics Concern  . Not on file   Social History Narrative   Married to wife, Reginald Ochoa for 35+ years. 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 Stroke Mother   . Cancer Maternal Uncle     bvrain tumor   . Cancer Cousin     brain tumor     ALLERGIES:  has No Known Allergies.  MEDICATIONS:  Current Outpatient Prescriptions  Medication Sig Dispense Refill  . albuterol (PROVENTIL HFA;VENTOLIN HFA) 108 (90 BASE) MCG/ACT inhaler Inhale 2 puffs into the lungs every 6 (six) hours as needed for wheezing or shortness of breath.    . ALPRAZolam (XANAX) 1 MG  tablet Take 0.5-1 mg by mouth 2 (two) times daily as needed for anxiety. Anxiety     . Amino Acids-Protein Hydrolys (FEEDING SUPPLEMENT, PRO-STAT SUGAR FREE 64,) LIQD Take 30 mLs by mouth 3 (three) times daily. 900 mL 0  . amLODipine (NORVASC) 10 MG tablet Take 1 tablet (10 mg total) by mouth daily. 30 tablet 0  . amphetamine-dextroamphetamine (ADDERALL) 10 MG tablet Take 5-10 mg by mouth daily with breakfast. 10mg  in the morning and 5 mg at lunch    . aspirin EC 81 MG tablet Take 1 tablet (81 mg total) by mouth daily. 30 tablet 0  . budesonide-formoterol (SYMBICORT) 160-4.5 MCG/ACT inhaler  Inhale 2 puffs into the lungs 2 (two) times daily.    . cetirizine (ZYRTEC) 10 MG tablet Take 10 mg by mouth at bedtime.    . cholecalciferol (VITAMIN D) 1000 units tablet Take 1,000 Units by mouth daily.    Marland Kitchen FLUoxetine (PROZAC) 40 MG capsule Take 40 mg by mouth daily with breakfast.      . HYDROcodone-acetaminophen (NORCO) 10-325 MG tablet Take 1 tablet by mouth every 8 (eight) hours as needed for severe pain. 30 tablet 0  . LUTEIN PO Take 1 tablet by mouth daily.    . Multiple Vitamins-Minerals (MULTIVITAMIN WITH MINERALS) tablet Take 1 tablet by mouth daily.     . ondansetron (ZOFRAN ODT) 8 MG disintegrating tablet Take 1 tablet (8 mg total) by mouth every 8 (eight) hours as needed for nausea or vomiting. 30 tablet 1  . OVER THE COUNTER MEDICATION Place 2 sprays into both nostrils as needed (for congestion). [facility] Nasal Spray for congestion    . Pancrelipase, Lip-Prot-Amyl, (CREON) 24000-76000 units CPEP Take 1 capsule (24,000 Units total) by mouth 3 (three) times daily with meals. 90 capsule 1  . polyethylene glycol (MIRALAX / GLYCOLAX) packet Take 17 g by mouth daily as needed for mild constipation. 28 each 0  . prochlorperazine (COMPAZINE) 10 MG tablet Take 1 tablet (10 mg total) by mouth every 6 (six) hours as needed (Nausea or vomiting). 30 tablet 1  . senna-docusate (SENOKOT-S) 8.6-50 MG tablet Take 1  tablet by mouth at bedtime as needed for mild constipation. (Patient not taking: Reported on 04/10/2016) 14 tablet 0   No current facility-administered medications for this visit.     REVIEW OF SYSTEMS:   Constitutional: Denies fevers, chills or abnormal night sweats  Eyes: Denies blurriness of vision, double vision or watery eyes Ears, nose, mouth, throat, and face: Denies mucositis or sore throat Respiratory: Denies cough, dyspnea or wheezes Cardiovascular: Denies palpitation, chest discomfort or lower extremity swelling Gastrointestinal:  Denies heartburn or change in bowel habits (+) nausea and vomiting Skin: Denies abnormal skin rashes Lymphatics: Denies new lymphadenopathy or easy bruising Neurological:Denies numbness, tingling or new weaknesses Behavioral/Psych: Mood is stable, no new changes  All other systems were reviewed with the patient and are negative.  PHYSICAL EXAMINATION: ECOG PERFORMANCE STATUS: 3  Vitals:   04/17/16 1329  BP: 125/83  Pulse: 90  Resp: 18  Temp: 98.3 F (36.8 C)   Filed Weights   04/17/16 1329  Weight: 119 lb 8 oz (54.2 kg)    GENERAL:alert, no distress and comfortable (+) jaundice SKIN: skin color, texture, turgor are normal, no rashes or significant lesions EYES: normal, conjunctiva are pink and non-injected, sclera clear OROPHARYNX:no exudate, no erythema and lips, buccal mucosa, and tongue normal  NECK: supple, thyroid normal size, non-tender, without nodularity LYMPH:  no palpable lymphadenopathy in the cervical, axillary or inguinal LUNGS: clear to auscultation and percussion with normal breathing effort HEART: regular rate & rhythm and no murmurs and no lower extremity edema ABDOMEN:abdomen soft, non-tender and normal bowel sounds Musculoskeletal:no cyanosis of digits and no clubbing  PSYCH: alert & oriented x 3 with fluent speech NEURO: no focal motor/sensory deficits   LABORATORY DATA:  I have reviewed the data as listed CBC  Latest Ref Rng & Units 04/17/2016 04/10/2016 03/22/2016  WBC 4.0 - 10.3 10e3/uL 11.6(H) 9.4 6.2  Hemoglobin 13.0 - 17.1 g/dL 12.4(L) 11.8(L) 11.6(L)  Hematocrit 38.4 - 49.9 % 37.6(L) 36.1(L) 35.7(L)  Platelets 140 - 400 10e3/uL 304 372  135(L)   CMP Latest Ref Rng & Units 04/17/2016 04/13/2016 04/12/2016  Glucose 70 - 140 mg/dl 361(H) 205(H) 222(H)  BUN 7.0 - 26.0 mg/dL 25.4 14 13   Creatinine 0.7 - 1.3 mg/dL 1.2 1.08 1.05  Sodium 136 - 145 mEq/L 137 134(L) 133(L)  Potassium 3.5 - 5.1 mEq/L 4.3 3.5 3.6  Chloride 101 - 111 mmol/L - 97(L) 97(L)  CO2 22 - 29 mEq/L 30(H) 28 27  Calcium 8.4 - 10.4 mg/dL 9.1 8.5(L) 8.3(L)  Total Protein 6.4 - 8.3 g/dL 6.5 6.1(L) 5.9(L)  Total Bilirubin 0.20 - 1.20 mg/dL 2.29(H) 2.3(H) 2.8(H)  Alkaline Phos 40 - 150 U/L 751(H) 793(H) 854(H)  AST 5 - 34 U/L 50(H) 62(H) 122(H)  ALT 0 - 55 U/L 107(H) 127(H) 149(H)   PATHOLOGY REPORT  Diagnosis 03/01/2016 Liver, needle/core biopsy, Right Lobe - ADENOCARCINOMA. Microscopic Comment The features are consistent with metastatic adenocarcinoma. (JDP:kh 03/04/16)   RADIOGRAPHIC STUDIES: I have personally reviewed the radiological images as listed and agreed with the findings in the report. Ct Abdomen Pelvis W Contrast  Result Date: 04/11/2016 CLINICAL DATA:  Recently diagnosed pancreatic cancer. Chemotherapy started. Fever. Increased right upper quadrant pain. Biliary stent placed 11/17. EXAM: CT ABDOMEN AND PELVIS WITH CONTRAST TECHNIQUE: Multidetector CT imaging of the abdomen and pelvis was performed using the standard protocol following bolus administration of intravenous contrast. CONTRAST:  133mL ISOVUE-300 IOPAMIDOL (ISOVUE-300) INJECTION 61% COMPARISON:  02/28/2016 all FINDINGS: Lower chest: Clear lung bases. Normal heart size without pericardial or pleural effusion. Hepatobiliary: Hepatic metastasis. Index hepatic dome lesion measures 3.1 cm in image 7/ series 7 versus 1.9 cm on the prior. An index lesion within  the anterior right and medial left hepatic lobes measures 2.2 cm on image 19/series 7 versus 1.3 cm on the prior exam (when remeasured). Other lesions are new and enlarged. Borderline gallbladder distension is unchanged. Probable sludge in the gallbladder. No specific evidence of acute cholecystitis. Pneumobilia. Mild intrahepatic biliary duct dilatation, increased since the prior CT. Common duct stent originates at the level porta hepatis and terminates within been descending duodenum. The common duct measures maximally 1.6 cm on image 27/series 7 just above the stent. High-density material within the stent including on image 36/series 7. Pancreas: Pancreatic body/ tail atrophy with duct dilatation. This continues to the level of a pancreatic head/ uncinate process mass which measures 3.7 x 3.8 cm on image 45/series 7. Compare 3.5 x 2.8 cm on the prior. No acute pancreatitis. Spleen: Old granulomatous disease in the spleen. Adrenals/Urinary Tract: Normal adrenal glands. Right renal cysts. Normal left kidney, without hydronephrosis. Mild bladder wall thickening. Stomach/Bowel: Normal colon and terminal ileum.  Normal small bowel. Vascular/Lymphatic: Aortic and branch vessel atherosclerosis. Patent portal and hepatic veins. 10 mm portal caval node is not pathologic by size criteria but is enlarged from 6 mm on the prior. Necrotic peripancreatic adenopathy including at 1.4 cm anterior to the pancreatic head on image 46/series 7. Enlarged from 7 mm previously. No pelvic sidewall adenopathy. Reproductive: Moderate prostatomegaly. Probable dystrophic calcification within the penis. Other: No significant free fluid. No evidence of omental or peritoneal disease. Musculoskeletal: Lumbosacral spondylosis with convex right lumbar spine curvature. IMPRESSION: 1. Mild enlargement of pancreatic head/uncinate process mass and progression of hepatic metastasis. 2. Interval placement of a common duct stent. Since 02/28/2016,  progression of biliary duct dilatation. Pneumobilia, arguing against complete stent obstruction. Hyperattenuation within the stent could be artifactual. Cannot exclude stones within. 3. Persistent borderline gallbladder distention without specific evidence of  acute cholecystitis. 4. Progressive peripancreatic nodal metastasis. 5. Prostatomegaly with bladder wall thickening, likely secondary and related to a component of outlet obstruction. Electronically Signed   By: Abigail Miyamoto M.D.   On: 04/11/2016 08:23   US Abdomen Limited  Result Date: 03/25/2016 CLINICAL DATA:  Rule out biliary obstruction. Worsening jaundice. Liver metastasis from pancreatic cancer. EXAM: US ABDOMEN LIMITED - RIGHT UPPER QUADRANT COMPARISON:  CT of 02/28/2016 FINDINGS: Gallbladder: Moderate moderate gallbladder sludge. No pericholecystic fluid or wall thickening. Gallbladder distension, including at 12.4 cm. Sonographic Murphy's sign was not elicited. Common bile duct: Diameter: Moderate to markedly dilated, 19 mm. This is new. Echogenic material within is likely due to sludge. Liver: Hepatic metastasis, as on prior CT. Example within the right lobe of the liver at 1.7 cm. Intrahepatic ductal dilatation. IMPRESSION: 1. Development of moderate to marked biliary duct dilatation with common duct sludge. 2. Gallbladder sludge without acute cholecystitis. Gallbladder dilatation is also likely due to biliary obstruction. 3. Hepatic metastasis. Electronically Signed   By: Abigail Miyamoto M.D.   On: 03/25/2016 12:32   Dg Ercp Biliary & Pancreatic Ducts  Result Date: 03/29/2016 CLINICAL DATA:  Pancreatic carcinoma. Jaundice. Biliary obstruction. EXAM: ERCP TECHNIQUE: Multiple spot images obtained with the fluoroscopic device and submitted for interpretation post-procedure. COMPARISON:  CT on 02/28/2016 FINDINGS: ERCP performed by Dr. Watt Climes. Images show an abrupt stricture in the proximal to mid common bile duct. There is marked dilatation of  the proximal common duct as well as moderate dilatation of visualized intrahepatic bile ducts. Subsequent images show placement of a wall stent in the common bile duct, with a persistent waist in the mid to distal common bile duct. IMPRESSION: Abrupt stricture in the proximal to mid common bile duct, with moderate proximal biliary ductal dilatation. This is consistent with stricture due to known pancreatic carcinoma. Successful placement of biliary stent in the common bile duct. These images were submitted for radiologic interpretation only. Please see the procedural report for the amount of contrast and the fluoroscopy time utilized. Electronically Signed   By: Earle Gell M.D.   On: 03/29/2016 10:53    ASSESSMENT & PLAN: 75 year old Caucasian male, with past medical history of hypertension, depression, anxiety, alcohol and tobacco abuse, presented with diarrhea and fall.  1. Metastatic pancreatic adenocarcinoma to liver -I previously reviewed his CT scan findings and the biopsy results with patient and his family members in details. -His liver biopsy confirmed metastatic adenocarcinoma, giving the large size of tumor in the pancreatic head, otherwise negative CT scans, I think this is metastatic adenocarcinoma from pancreas.  -We discussed that his cancer is incurable at this stage, and overall prognosis is very poor. Median survival is about 4-6 months. -We discussed the goal of therapy is palliative, to prolong his life and to preserve his quality of life. -We discussed systemic therapeutic options, including chemotherapy and immunotherapy. We discussed prior chemotherapy regiment and the response rate. Given his limited performance status, advanced age and medical comorbidities, I recommended him to start with single agent gemcitabine. If he tolerates well, I'll add Abraxane. I do not think he is a candidate for more intensive chemotherapy, such as FOLFIRINOX -Alternatively, palliative care alone,  was also discussed with patient. He would like to try chemotherapy first. -His chemotherapy has been held after the first week of gemcitabine, due to obstructive jaundice and hospitalization. He overall has improved, performance status still poor, we discussed continuing chemotherapy versus palliative care alone with hospice. Patient strongly wish  to try more chemotherapy. -Lab reviewed, will proceed weekly gemcitabine today and next week, with dose reduction to 800 mg/m -Patient is also taking some herbal supplements, such as Turmeric, I'll check with our pharmacist to see if it has interaction with gemcitabine  2. Hyperbilirubinemia -Likely secondary to his underlying malignancy (pancreatic cancer and liver metastasis), s/p CBD stent placement  -improved, not resolved   3. HTN -His amlodipine, but was found to have orthostatic hypotension, probably related to his diarrhea. -I encouraged him to monitor his blood pressure closely at home, and hold chemotherapy if his blood pressure is low or he feels dizzy. -We'll monitor his blood pressure closely during the chemotherapy.  4. Abdominal pain -secondary to related to underlying malignancy  -He will continue use hydrocodone as needed, his son is concerned amount of Tylenol he will take with hydrocodone, I given him prescription of tramadol as alternate pain medication. Tramadol has some interaction with Prozac, and I recommend him to take no more than twice a day.  5. Nausea, fatigue and weight loss  -Continue Zofran as needed -I encouraged him to take nutritional supplement  5. Anxiety, depression -Continue medication. I encouraged him to be positive  6. History of alcohol abuse, active smoker -I encouraged him to quit smoking, smoking cessation was discussed with patient and his family -He is willing to cut down his smoking   7. Goal of care and code status  -I discussed his goal of care is palliative, giving his terminal metastatic  pancreas cancer. Patient and his son voiced good understanding and agree with it. -I recommend DO NOT RESUSCITATE and DO NOT INTUBATE, patient will think about it.   Plan: -Labs reviewed, will proceed chemo with gemcitabine, dose reduced to 800mg /m2 due to poor PS, and continue weekly for 2 weeks, then 1 week off -lab, f/u and chem in one and 3 weeks   All questions were answered. The patient knows to call the clinic with any problems, questions or concerns.  I spent 30 minutes counseling the patient face to face. The total time spent in the appointment was 40 minutes and more than 50% was on counseling.     Reginald Merle, MD 04/17/2016 1:32 PM

## 2016-04-17 NOTE — Progress Notes (Signed)
OK to treat today with reduced dose despite elevated bilirubin per Dr Burr Medico.  Order repeated & verified.

## 2016-04-17 NOTE — Patient Instructions (Signed)
[facility] Discharge Instructions for Patients Receiving Chemotherapy  Today you received the following chemotherapy agents Gemzar  To help prevent nausea and vomiting after your treatment, we encourage you to take your nausea medication as directed.    If you develop nausea and vomiting that is not controlled by your nausea medication, call the clinic.   BELOW ARE SYMPTOMS THAT SHOULD BE REPORTED IMMEDIATELY:  *FEVER GREATER THAN 100.5 F  *CHILLS WITH OR WITHOUT FEVER  NAUSEA AND VOMITING THAT IS NOT CONTROLLED WITH YOUR NAUSEA MEDICATION  *UNUSUAL SHORTNESS OF BREATH  *UNUSUAL BRUISING OR BLEEDING  TENDERNESS IN MOUTH AND THROAT WITH OR WITHOUT PRESENCE OF ULCERS  *URINARY PROBLEMS  *BOWEL PROBLEMS  UNUSUAL RASH Items with * indicate a potential emergency and should be followed up as soon as possible.  Feel free to call the clinic you have any questions or concerns. The clinic phone number is [phone].  Please show the CHEMO ALERT CARD at check-in to the Emergency Department and triage nurse.   

## 2016-04-19 ENCOUNTER — Emergency Department (HOSPITAL_COMMUNITY)
Admission: EM | Admit: 2016-04-19 | Discharge: 2016-04-19 | Disposition: A | Discharge status: Home / Self Care | Payer: Medicare Other | Attending: Emergency Medicine | Admitting: Emergency Medicine

## 2016-04-19 ENCOUNTER — Encounter (HOSPITAL_COMMUNITY): Payer: Self-pay | Admitting: Emergency Medicine

## 2016-04-19 ENCOUNTER — Emergency Department (HOSPITAL_COMMUNITY): Payer: Medicare Other

## 2016-04-19 DIAGNOSIS — S79912A Unspecified injury of left hip, initial encounter: Secondary | ICD-10-CM | POA: Diagnosis present

## 2016-04-19 DIAGNOSIS — D649 Anemia, unspecified: Secondary | ICD-10-CM | POA: Diagnosis not present

## 2016-04-19 DIAGNOSIS — I10 Essential (primary) hypertension: Secondary | ICD-10-CM | POA: Diagnosis not present

## 2016-04-19 DIAGNOSIS — R748 Abnormal levels of other serum enzymes: Secondary | ICD-10-CM | POA: Diagnosis not present

## 2016-04-19 DIAGNOSIS — Y939 Activity, unspecified: Secondary | ICD-10-CM | POA: Insufficient documentation

## 2016-04-19 DIAGNOSIS — C787 Secondary malignant neoplasm of liver and intrahepatic bile duct: Secondary | ICD-10-CM | POA: Insufficient documentation

## 2016-04-19 DIAGNOSIS — Y929 Unspecified place or not applicable: Secondary | ICD-10-CM | POA: Insufficient documentation

## 2016-04-19 DIAGNOSIS — Z7982 Long term (current) use of aspirin: Secondary | ICD-10-CM | POA: Insufficient documentation

## 2016-04-19 DIAGNOSIS — W19XXXA Unspecified fall, initial encounter: Secondary | ICD-10-CM | POA: Diagnosis not present

## 2016-04-19 DIAGNOSIS — J45909 Unspecified asthma, uncomplicated: Secondary | ICD-10-CM | POA: Insufficient documentation

## 2016-04-19 DIAGNOSIS — F17210 Nicotine dependence, cigarettes, uncomplicated: Secondary | ICD-10-CM | POA: Diagnosis not present

## 2016-04-19 DIAGNOSIS — R509 Fever, unspecified: Secondary | ICD-10-CM

## 2016-04-19 DIAGNOSIS — Y999 Unspecified external cause status: Secondary | ICD-10-CM | POA: Insufficient documentation

## 2016-04-19 DIAGNOSIS — C259 Malignant neoplasm of pancreas, unspecified: Secondary | ICD-10-CM | POA: Diagnosis not present

## 2016-04-19 DIAGNOSIS — Z8673 Personal history of transient ischemic attack (TIA), and cerebral infarction without residual deficits: Secondary | ICD-10-CM | POA: Insufficient documentation

## 2016-04-19 DIAGNOSIS — S7002XA Contusion of left hip, initial encounter: Secondary | ICD-10-CM | POA: Diagnosis not present

## 2016-04-19 DIAGNOSIS — Y92009 Unspecified place in unspecified non-institutional (private) residence as the place of occurrence of the external cause: Secondary | ICD-10-CM

## 2016-04-19 LAB — COMPREHENSIVE METABOLIC PANEL
ALT: 83 U/L — ABNORMAL HIGH (ref 17–63)
AST: 48 U/L — ABNORMAL HIGH (ref 15–41)
Albumin: 3.2 g/dL — ABNORMAL LOW (ref 3.5–5.0)
Alkaline Phosphatase: 472 U/L — ABNORMAL HIGH (ref 38–126)
Anion gap: 9 (ref 5–15)
BUN: 25 mg/dL — ABNORMAL HIGH (ref 6–20)
CO2: 26 mmol/L (ref 22–32)
Calcium: 8.3 mg/dL — ABNORMAL LOW (ref 8.9–10.3)
Chloride: 101 mmol/L (ref 101–111)
Creatinine, Ser: 1.01 mg/dL (ref 0.61–1.24)
GFR calc Af Amer: >60 mL/min (ref >60)
GFR calc non Af Amer: >60 mL/min (ref >60)
Glucose, Bld: 172 mg/dL — ABNORMAL HIGH (ref 65–99)
Potassium: 3.8 mmol/L (ref 3.5–5.1)
Sodium: 136 mmol/L (ref 135–145)
Total Bilirubin: 1.9 mg/dL — ABNORMAL HIGH (ref 0.3–1.2)
Total Protein: 5.8 g/dL — ABNORMAL LOW (ref 6.5–8.1)

## 2016-04-19 LAB — CK: CK TOTAL: 37 U/L — AB (ref 49–397)

## 2016-04-19 LAB — URINALYSIS, ROUTINE W REFLEX MICROSCOPIC
BACTERIA UA: NONE SEEN
BILIRUBIN URINE: NEGATIVE
Glucose, UA: 50 mg/dL — AB
HGB URINE DIPSTICK: NEGATIVE
Ketones, ur: NEGATIVE mg/dL
Nitrite: NEGATIVE
PROTEIN: NEGATIVE mg/dL
SPECIFIC GRAVITY, URINE: 1.021 (ref 1.005–1.030)
pH: 5 (ref 5.0–8.0)

## 2016-04-19 LAB — CBC WITH DIFFERENTIAL/PLATELET
Basophils Absolute: 0 10*3/uL (ref 0.0–0.1)
Basophils Relative: 0 %
EOS ABS: 0 10*3/uL (ref 0.0–0.7)
EOS PCT: 0 %
HCT: 31.6 % — ABNORMAL LOW (ref 39.0–52.0)
Hemoglobin: 10.6 g/dL — ABNORMAL LOW (ref 13.0–17.0)
LYMPHS ABS: 1 10*3/uL (ref 0.7–4.0)
LYMPHS PCT: 7 %
MCH: 30.2 pg (ref 26.0–34.0)
MCHC: 33.5 g/dL (ref 30.0–36.0)
MCV: 90 fL (ref 78.0–100.0)
MONOS PCT: 3 %
Monocytes Absolute: 0.4 10*3/uL (ref 0.1–1.0)
Neutro Abs: 14.2 10*3/uL — ABNORMAL HIGH (ref 1.7–7.7)
Neutrophils Relative %: 90 %
PLATELETS: 236 10*3/uL (ref 150–400)
RBC: 3.51 MIL/uL — AB (ref 4.22–5.81)
RDW: 14.8 % (ref 11.5–15.5)
WBC: 15.7 10*3/uL — ABNORMAL HIGH (ref 4.0–10.5)

## 2016-04-19 LAB — I-STAT CG4 LACTIC ACID, ED: LACTIC ACID, VENOUS: 0.75 mmol/L (ref 0.5–1.9)

## 2016-04-19 MED ORDER — SODIUM CHLORIDE 0.9 % IV BOLUS (SEPSIS)
1000.0000 mL | Freq: Once | INTRAVENOUS | Status: AC
Start: 2016-04-19 — End: 2016-04-19
  Administered 2016-04-19: 1000 mL via INTRAVENOUS

## 2016-04-19 NOTE — Progress Notes (Signed)
   04/19/16 0000  CM Assessment  Expected Discharge [place]  In-house Referral NA  Discharge Planning Services CM Consult  [facility] Choice Durable Medical Equipment;Home Health  Choice offered to / list presented to  Patient;Adult Children  DME Arranged Bedside commode;Lightweight manual wheelchair with seat cushion  DME [place]  Status of Service Completed, signed off  Discharge [place]  consulted by Dr Wilson Singer for DME for pt  Fever, unspecified fever cause Elevated liver enzymes Pancreatic cancer metastasized to liver ([place]) Normochromic normocytic anemia Fall in home, initial encounter Contusion of left hip, initial encounter

## 2016-04-19 NOTE — ED Notes (Signed)
Per case management, around 12p home health will bring patient and son assistive device equipment to take to home for patient.

## 2016-04-19 NOTE — ED Notes (Signed)
Bed: [id number] Expected date:  Expected time:  Means of arrival:  Comments: EMS 75 yo male from home/CA-chemo yesterday-fever 100.9/took ibuprofen 150/90 HR 90 96% RA

## 2016-04-19 NOTE — Progress Notes (Signed)
Re  Reviewed all resources with son and ED RN, transportation Pending arrival of DME for step son to take home in his car

## 2016-04-19 NOTE — ED Notes (Signed)
[facility] notified of transport back to residence.

## 2016-04-19 NOTE — ED Notes (Addendum)
Case management at bedside. Helping patient and son get set up with home health.

## 2016-04-19 NOTE — ED Notes (Signed)
Discussed with Dr Wilson Singer patient unable to ambulate per his normal and Dr Roxanne Mins had mentioned reversing patient's discharge if he is unable to tolerate ambulation.  Per Dr Wilson Singer, discuss if patient/family feel comfortable going home or not.

## 2016-04-19 NOTE — ED Notes (Signed)
Home health worker at bedside with assistive devices.

## 2016-04-19 NOTE — ED Notes (Signed)
Pt.'s stepson stated that pt. Fell yesterday and had a hematoma and swelling on left hip.

## 2016-04-19 NOTE — ED Notes (Addendum)
Pt ambulated at side of bed with minimal assist for 4 to 5 steps. Post few steps pt verbalized weakness and need to sit down. Pt denies SOB or dizziness. Pt assisted into bed. Primary nurse notified of pt unsuccessful to ambulate per normal.

## 2016-04-19 NOTE — ED Notes (Signed)
Patient transported to X-ray 

## 2016-04-19 NOTE — ED Triage Notes (Addendum)
Per EMS, pt. From home with complaint of fever of 100.9'F orally which started at 6pm last night . Pt. Had chemo therapy yesterday morning. Pt. Has pancreatic CA and neuromuscular disorder.alert and oriented x4. Also complaint of body malaise. Denied SOB.

## 2016-04-19 NOTE — ED Provider Notes (Signed)
[facility] DEPT Provider Note   CSN: [id number] Arrival date & time: 04/19/16  0459     History   Chief Complaint Chief Complaint  Patient presents with  . Fever    post chemo  . Fatigue    HPI Reginald Ochoa is a 75 y.o. male.  He has a history of metastatic pancreatic cancer and received his second dose of gemcitabine 2 days ago. Yesterday, he was running fevers as high as 101. He had a fall yesterday and injured his left hip. During the course of the last 6 hours, he has had progressive, generalized weakness to the point where he cannot stand up. He denies chills or sweats. He denies cough. He denies nausea, vomiting, diarrhea. He denies dysuria. Of note, he had a biliary stent placed about 3 weeks ago. He had received his first chemotherapy infusion in the week prior to the stent placement.   The history is provided by the patient and a relative.    Past Medical History:  Diagnosis Date  . Alcohol abuse   . Asthma   . Cancer [facility])    pancreatic with liver lesions  . Complication of anesthesia    PMH: hard to put to sleep  . Depression   . Depression with anxiety   . GERD (gastroesophageal reflux disease)   . GIB (gastrointestinal bleeding)   . Headache   . Heartburn   . Hypertension   . Neuromuscular disorder ([place])    pinched nerve  . Pancreatic mass   . Pneumonia   . TIA (transient ischemic attack)   . Tobacco abuse     Patient Active Problem List   Diagnosis Date Noted  . Goals of care, counseling/discussion 04/17/2016  . Severe protein-calorie malnutrition ([place])   . Abdominal pain 04/12/2016  . RUQ abdominal pain 04/11/2016  . Elevated alkaline phosphatase level 04/11/2016  . Fever 04/10/2016  . Unintentional weight loss 04/10/2016  . Transaminitis 04/10/2016  . Dehydration 04/10/2016  . Bunion 04/10/2016  . Pancreatic cancer metastasized to liver ([place]) 03/07/2016  . Tachycardia 03/05/2016  . Abnormal EKG 03/05/2016  . Tobacco abuse  02/29/2016  . Depression with anxiety 02/29/2016  . Fall 02/29/2016  . Syncope 02/29/2016  . AKI (acute kidney injury) ([place]) 02/29/2016  . Hypertension   . Asthma exacerbation   . Alcohol abuse     Past Surgical History:  Procedure Laterality Date  . bleeding ulcer    . CATARACT EXTRACTION    . COLONOSCOPY    . ERCP N/A 03/29/2016   Procedure: ENDOSCOPIC RETROGRADE CHOLANGIOPANCREATOGRAPHY (ERCP);  Surgeon: Clarene Essex, MD;  Location: [facility] ENDOSCOPY;  Service: Endoscopy;  Laterality: N/A;  . MULTIPLE TOOTH EXTRACTIONS    . TONSILLECTOMY         Home Medications    Prior to Admission medications   Medication Sig Start Date End Date Taking? Authorizing Provider  albuterol (PROVENTIL HFA;VENTOLIN HFA) 108 (90 BASE) MCG/ACT inhaler Inhale 2 puffs into the lungs every 6 (six) hours as needed for wheezing or shortness of breath.    Historical Provider, MD  ALPRAZolam Duanne Moron) 1 MG tablet Take 0.5-1 mg by mouth 2 (two) times daily as needed for anxiety. Anxiety     Historical Provider, MD  Amino Acids-Protein Hydrolys (FEEDING SUPPLEMENT, PRO-STAT SUGAR FREE 64,) LIQD Take 30 mLs by mouth 3 (three) times daily. 04/13/16   Barton Dubois, MD  amLODipine (NORVASC) 10 MG tablet Take 1 tablet (10 mg total) by mouth daily. Patient not taking:  Reported on 04/17/2016 04/13/16   Barton Dubois, MD  amphetamine-dextroamphetamine (ADDERALL) 10 MG tablet Take 5-10 mg by mouth daily with breakfast. 10mg  in the morning and 5 mg at lunch    Historical Provider, MD  aspirin EC 81 MG tablet Take 1 tablet (81 mg total) by mouth daily. 04/13/16   Barton Dubois, MD  budesonide-formoterol [facility]) 160-4.5 MCG/ACT inhaler Inhale 2 puffs into the lungs 2 (two) times daily.    Historical Provider, MD  cetirizine (ZYRTEC) 10 MG tablet Take 10 mg by mouth at bedtime.    Historical Provider, MD  cholecalciferol (VITAMIN D) 1000 units tablet Take 1,000 Units by mouth daily.    Historical Provider, MD  FLUoxetine  (PROZAC) 40 MG capsule Take 40 mg by mouth daily with breakfast.      Historical Provider, MD  HYDROcodone-acetaminophen (NORCO) 10-325 MG tablet Take 1 tablet by mouth every 8 (eight) hours as needed for severe pain. 04/13/16   Barton Dubois, MD  LUTEIN PO Take 1 tablet by mouth daily.    Historical Provider, MD  Multiple Vitamins-Minerals (MULTIVITAMIN WITH MINERALS) tablet Take 1 tablet by mouth daily.     Historical Provider, MD  ondansetron (ZOFRAN ODT) 8 MG disintegrating tablet Take 1 tablet (8 mg total) by mouth every 8 (eight) hours as needed for nausea or vomiting. 03/22/16   Truitt Merle, MD  OVER THE COUNTER MEDICATION Place 2 sprays into both nostrils as needed (for congestion). [facility] Nasal Spray for congestion    Historical Provider, MD  Pancrelipase, Lip-Prot-Amyl, (CREON) 24000-76000 units CPEP Take 1 capsule (24,000 Units total) by mouth 3 (three) times daily with meals. 03/08/16   Truitt Merle, MD  polyethylene glycol [facility] Floria Raveling) packet Take 17 g by mouth daily as needed for mild constipation. 04/13/16   Barton Dubois, MD  prochlorperazine (COMPAZINE) 10 MG tablet Take 1 tablet (10 mg total) by mouth every 6 (six) hours as needed (Nausea or vomiting). 03/10/16   Truitt Merle, MD  senna-docusate (SENOKOT-S) 8.6-50 MG tablet Take 1 tablet by mouth at bedtime as needed for mild constipation. 03/07/16   Lavina Hamman, MD  traMADol (ULTRAM) 50 MG tablet Take 1 tablet (50 mg total) by mouth every 12 (twelve) hours as needed for moderate pain or severe pain. 04/17/16   Truitt Merle, MD    Family History Family History  Problem Relation Age of Onset  . Stroke Mother   . Cancer Maternal Uncle     bvrain tumor   . Cancer Cousin     brain tumor     Social History Social History  Substance Use Topics  . Smoking status: Current Every Day Smoker    Packs/day: 0.50    Years: 40.00    Types: Cigarettes, Cigars  . Smokeless tobacco: Never Used  . Alcohol use Yes     Comment: used to be a heavy  drinker and stopped 20 years ago       Allergies   Patient has no known allergies.   Review of Systems Review of Systems  All other systems reviewed and are negative.    Physical Exam Updated Vital Signs BP 133/98 (BP Location: Left Arm)   Pulse 116   Temp 99.7 F (37.6 C) (Oral)   Resp 21   SpO2 100%   Physical Exam  Nursing note and vitals reviewed.  75 year old male, resting comfortably and in no acute distress. Vital signs are significant for tachycardia and diastolic hypertension. Oxygen saturation is  100%, which is normal. Head is normocephalic and atraumatic. PERRLA, EOMI. Oropharynx is clear. Neck is nontender and supple without adenopathy or JVD. Back is nontender and there is no CVA tenderness. Lungs are clear without rales, wheezes, or rhonchi. Chest is nontender. Heart has regular rate and rhythm without murmur. Abdomen is soft, flat, nontender without masses or hepatosplenomegaly and peristalsis is hypoactive. Extremities have no cyanosis or edema. There is moderate tenderness over the lateral aspect of the left hip without obvious deformity. Skin is warm and dry without rash. Neurologic: Mental status is normal, cranial nerves are intact, there are no motor or sensory deficits.  ED Treatments / Results  Labs (all labs ordered are listed, but only abnormal results are displayed) Labs Reviewed  COMPREHENSIVE METABOLIC PANEL - Abnormal; Notable for the following:       Result Value   Glucose, Bld 172 (*)    BUN 25 (*)    Calcium 8.3 (*)    Total Protein 5.8 (*)    Albumin 3.2 (*)    AST 48 (*)    ALT 83 (*)    Alkaline Phosphatase 472 (*)    Total Bilirubin 1.9 (*)    All other components within normal limits  CBC WITH DIFFERENTIAL/PLATELET - Abnormal; Notable for the following:    WBC 15.7 (*)    RBC 3.51 (*)    Hemoglobin 10.6 (*)    HCT 31.6 (*)    Neutro Abs 14.2 (*)    All other components within normal limits  CK - Abnormal; Notable for  the following:    Total CK 37 (*)    All other components within normal limits  URINALYSIS, ROUTINE W REFLEX MICROSCOPIC - Abnormal; Notable for the following:    Color, Urine AMBER (*)    Glucose, UA 50 (*)    Leukocytes, UA TRACE (*)    Squamous Epithelial / LPF 0-5 (*)    All other components within normal limits  CULTURE, BLOOD (ROUTINE X 2)  CULTURE, BLOOD (ROUTINE X 2)  I-STAT CG4 LACTIC ACID, ED    Radiology Dg Chest 2 View  Result Date: 04/19/2016 CLINICAL DATA:  [place] at home this morning. EXAM: CHEST  2 VIEW COMPARISON:  03/05/2016 FINDINGS: Skin folds across the right hemithorax. No pneumothorax is evident. The lungs are clear. Pulmonary vasculature is normal. Hilar, mediastinal and cardiac contours are unremarkable and unchanged. IMPRESSION: No acute cardiopulmonary disease. Electronically Signed   By: Andreas Newport M.D.   On: 04/19/2016 05:58   Dg Hip Unilat W Or Wo Pelvis 2-3 Views Left  Result Date: 04/19/2016 CLINICAL DATA:  [place] at home this morning EXAM: DG HIP (WITH OR WITHOUT PELVIS) 2-3V LEFT COMPARISON:  None. FINDINGS: There is no evidence of hip fracture or dislocation. There is no evidence of arthropathy or other focal bone abnormality. IMPRESSION: Negative. Electronically Signed   By: Andreas Newport M.D.   On: 04/19/2016 05:59    Procedures Procedures (including critical care time)  Medications Ordered in ED Medications - No data to display   Initial Impression / Assessment and Plan / ED Course  I have reviewed the triage vital signs and the nursing notes.  Pertinent labs & imaging results that were available during my care of the patient were reviewed by me and considered in my medical decision making (see chart for details).  Clinical Course    Fever immediately following administration of gemcitabine. We'll check CBC to see if he is leukopenic. Blood cultures  are obtained and will check chest x-ray and urinalysis. Left hip pain following  fall-Will check x-rays.  WBC is adequate-even high. Chest x-ray and urinalysis show no evidence of infection. Left hip x-ray is negative for fracture. Laboratory workup does show declining transaminase, bilirubin, and alkaline phosphatase. Case is discussed with Dr. Marin Olp who is in agreement with with holding antibiotics at this point. Possible causes for fever include drug fever from gemcitabine, fever related to tumor burden, unrelated viral illness. Son is concerned that he was not able to walk at home so he'll be related in the ED. He is to follow-up with his oncologist.  Final Clinical Impressions(s) / ED Diagnoses   Final diagnoses:  Fever, unspecified fever cause  Elevated liver enzymes  Pancreatic cancer metastasized to liver (HCC)  Normochromic normocytic anemia  Fall in home, initial encounter  Contusion of left hip, initial encounter    New Prescriptions New Prescriptions   No medications on file     Delora Fuel, MD [phone] [phone]

## 2016-04-19 NOTE — Discharge Instructions (Signed)
Continue taking acetaminophen or ibuprofen as needed for fever. Return if you are having any problems - especially if fever does not respond, or if you develop chills.

## 2016-04-19 NOTE — ED Notes (Signed)
Son given ice water.

## 2016-04-19 NOTE — Progress Notes (Signed)
Patient suffers from Fever, unspecified fever cause, Elevated liver enzymes, Pancreatic cancer metastasized to liver [facility]), Normochromic normocytic anemia, Fall in home, initial encounter, Contusion of left hip, initial encounter which impairs their ability to perform daily activities like mobility, bathing, dressing in the home.  A cane nor walker} will not resolve  issue with performing activities of daily living. A wheelchair will allow patient to safely perform daily activities. Patient is not able to propel themselves in the home using a standard weight wheelchair due to increase weakness after chemotherapy treatments, weight 119 pounds and height 5 foot 6 inches. Patient can self propel in the lightweight wheelchair.  Accessories: elevating leg rests (ELRs), wheel locks, extensions and anti-tippers.

## 2016-04-19 NOTE — ED Notes (Signed)
ED Provider at bedside. 

## 2016-04-19 NOTE — Progress Notes (Addendum)
ED CM spoke with pt and step son Primary care givers step son, spouse (who works and assists in care) and male family friend (step son's father) Pcp Lucianne Lei States has walker and cane at home Pt recently received chemo after a stent was placed and has become weaker Has use of [facility] previously and after choice offered pt and step son agreed to receive DME and [place] services from [facility]  CM reviewed in details medicare guidelines, Choices of home health [facility]) (length of stay in home, types of [facility] staff available, coverage, primary caregiver, up to 24 hrs before services may be started) and choices of Private duty nursing (PDN-coverage, length of stay in the home types of staff available). CM answered questions about hospice services Discussed home health services and home hospice services can not occur at the same time. CM provided pt/family with a list of [place] home health agencies, home hospice, choice connections and PDN.  Discussed [facility] can assist with transport home via [place] CM spoke with [facility] staff for Home health PT, aide [phone] Spoke with Larene Beach of [facility] who states that DME can be delivered to pt at [facility] but ETA one hour

## 2016-04-19 NOTE — ED Notes (Signed)
Discussed if patient and son feel comfortable going back home.  Patient wants to go home, son is unsure about patient going back home since unable to ambulate to into bathroom.  Made son aware that we can put in consult for case management to come speak with them regarding [facility] and possible home health assistance.  Son and patient felt like that would be very beneficial.  Made Dr Wilson Singer aware of our conversation.

## 2016-04-22 ENCOUNTER — Telehealth: Payer: Self-pay | Admitting: Hematology

## 2016-04-22 MED FILL — ONDANSETRON ODT 8 MG TABLET: 8 | 10 days supply | Qty: 30 | Fill #1

## 2016-04-22 NOTE — Telephone Encounter (Signed)
Spoke with patient son re appointments for 12/12. Patient to get new schedule 12/12.

## 2016-04-22 NOTE — Progress Notes (Signed)
Reginald Ochoa  [phone] [phone]) [phone]  Clinic Follow Up Note   Patient Care Team: Reginald Ochoa Lei, MD as PCP - General (Family Medicine) 04/23/2016    CHIEF COMPLAINTS:  Follow up metastatic pancreatic adenocarcinoma  Oncology History   Pancreatic cancer metastasized to liver [facility])   Staging form: Pancreas, AJCC 7th Edition   - Clinical stage from 03/01/2016: Stage IV (T2, N1, M1) - Signed by Reginald Merle, MD on 03/07/2016      Pancreatic cancer metastasized to liver [facility])   02/28/2016 - 03/01/2016 Hospital Admission    Pt presented with syncope and diarrhea. c-diff (-), he underwent liver biopsy and CT scans, and was discharged home.       03/01/2016 Initial Diagnosis    Pancreatic cancer metastasized to liver ([place])      03/01/2016 Initial Biopsy    Liver biopsy showed adenocarcinoma, features are consistent with metastatic adenocarcinoma      03/01/2016 Imaging    CT chest, abdomen and pelvis with contrast showed hyperenhancing mass in the head and uncinate process of the pancreas, with small nodular density anterior to the mass is concerning for cellulitis lesions, multiple hepatic hypodensities lesions most compatible with metastatic disease.      03/01/2016 Tumor Marker    CEA 10.5, CA19.9 2         HISTORY OF PRESENTING ILLNESS (03/08/2016):  Reginald Ochoa 75 y.o. male with past medical history of hypertension, asthma, depression, anxiety, and alcohol abuse, is here because of His recent diagnosed metastatic pancreatic adenocarcinoma. He is accompanied by his wife Reginald Ochoa and son Reginald Ochoa to my clinic today.  He has been having mild diarrhea for the past 2-3 weeks, lose stool, 3-5 times a day. 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 He may lost consciousness for a short period time. He was found by EMS which happened to be there, and was send to ED.He was found to have positive orthostatic vital signs  in ED. CT abdomen/pelvis showed possible pancreatic cancerwith possible liver metastasis. He was admitted to the hospital, underwent liver biopsy, and was discharged home the next day.   He has no nausea, no pain, diarrhea improved lately,  2-3 times a day, he has mild fatigue, appetite is good. 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 He is able to do all his ADLs, but not much housework or other activities.   He had three TIA, which affected his vision, he does not see well in the dark. He lives with his wife.  He has been on oxycodone since 1998 for head and neck pain since he had fall and neck injury.  he used to drink alcohol heavily, but has stopped 20 years ago. He has history of heavy smoking, still smokes half pack cigarettes a day.  CURRENT THERAPY: First line chemotherapy weekly gemcitabine 1000mg /m2, 2 weeks on, one-week off, started on 04/11/2016, second dose was postponed to 04/17/2016 due to obstructive jaundice and hospitalization  Interval History: [name] returns for follow up and chemo. He tolerated the first chemo well last week, he did felt very weak and had fever 2 days after chemo, and was evaluated at ED, and was released afterward. He has mild to moderate intermittent abdominal pain, he takes vicodin and tramadol as needed. Appetite is good overall, he has been able to ambulate at home, although not very active.   MEDICAL HISTORY:  Past Medical  History:  Diagnosis Date  . Alcohol abuse   . Asthma   . Cancer [facility])    pancreatic with liver lesions  . Complication of anesthesia    PMH: hard to put to sleep  . Depression   . Depression with anxiety   . GERD (gastroesophageal reflux disease)   . GIB (gastrointestinal bleeding)   . Headache   . Heartburn   . Hypertension   . Neuromuscular disorder ([place])    pinched nerve  . Pancreatic mass   . Pneumonia   . TIA (transient ischemic attack)   . Tobacco abuse      SURGICAL HISTORY: Past Surgical History:  Procedure Laterality Date  . bleeding ulcer    . CATARACT EXTRACTION    . COLONOSCOPY    . ERCP N/A 03/29/2016   Procedure: ENDOSCOPIC RETROGRADE CHOLANGIOPANCREATOGRAPHY (ERCP);  Surgeon: Clarene Essex, MD;  Location: [facility] ENDOSCOPY;  Service: Endoscopy;  Laterality: N/A;  . MULTIPLE TOOTH EXTRACTIONS    . TONSILLECTOMY      SOCIAL HISTORY: Social History   Social History  . Marital status: Married    Spouse name: Reginald Ochoa  . Number of children: 3  . Years of education: N/A   Occupational History  . Author     Architect   Social History Main Topics  . Smoking status: Current Every Day Smoker    Packs/day: 0.50    Years: 40.00    Types: Cigarettes, Cigars  . Smokeless tobacco: Never Used  . Alcohol use Yes     Comment: used to be a heavy drinker and stopped 20 years ago    . Drug use: No  . Sexual activity: Not on file   Other Topics Concern  . Not on file   Social History Narrative   Married to wife, Reginald Ochoa for 35+ years. 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 Stroke Mother   . Cancer Maternal Uncle     bvrain tumor   . Cancer Cousin     brain tumor     ALLERGIES:  has No Known Allergies.  MEDICATIONS:  Current Outpatient Prescriptions  Medication Sig Dispense Refill  . albuterol (PROVENTIL HFA;VENTOLIN HFA) 108 (90 BASE) MCG/ACT inhaler Inhale 2 puffs into the lungs every 6 (six) hours as needed for wheezing or shortness of breath.    . ALPRAZolam (XANAX) 1 MG tablet Take 0.5-1 mg by mouth 2 (two) times daily as needed for anxiety. Anxiety     . Amino Acids-Protein Hydrolys (FEEDING SUPPLEMENT, PRO-STAT SUGAR FREE 64,) LIQD Take 30 mLs by mouth 3 (three) times daily. 900 mL 0  .  amphetamine-dextroamphetamine (ADDERALL) 10 MG tablet Take 5-10 mg by mouth daily with breakfast. 10mg  in the morning and 5 mg at lunch    . aspirin EC 81 MG tablet Take 1 tablet (81 mg total) by mouth daily. 30 tablet 0  . budesonide-formoterol (SYMBICORT) 160-4.5 MCG/ACT inhaler Inhale 2 puffs into the lungs 2 (two) times daily.    . cetirizine (ZYRTEC) 10 MG tablet Take 10 mg by mouth at bedtime.    . cholecalciferol (VITAMIN D) 1000 units tablet Take 1,000 Units by mouth daily.    Marland Kitchen FLUoxetine (PROZAC) 40 MG capsule Take 40 mg  by mouth daily with breakfast.      . HYDROcodone-acetaminophen (NORCO) 10-325 MG tablet Take 1 tablet by mouth every 8 (eight) hours as needed for severe pain. 30 tablet 0  . ibuprofen (ADVIL,MOTRIN) 200 MG tablet Take 200 mg by mouth every 6 (six) hours as needed for moderate pain.    Marland Kitchen LUTEIN PO Take 1 tablet by mouth daily.    . Multiple Vitamins-Minerals (MULTIVITAMIN WITH MINERALS) tablet Take 1 tablet by mouth daily.     . ondansetron (ZOFRAN ODT) 8 MG disintegrating tablet Take 1 tablet (8 mg total) by mouth every 8 (eight) hours as needed for nausea or vomiting. 30 tablet 1  . OVER THE COUNTER MEDICATION Place 2 sprays into both nostrils as needed (for congestion). [facility] Nasal Spray for congestion    . Pancrelipase, Lip-Prot-Amyl, (CREON) 24000-76000 units CPEP Take 1 capsule (24,000 Units total) by mouth 3 (three) times daily with meals. 90 capsule 1  . polyethylene glycol (MIRALAX / GLYCOLAX) packet Take 17 g by mouth daily as needed for mild constipation. 28 each 0  . prochlorperazine (COMPAZINE) 10 MG tablet Take 1 tablet (10 mg total) by mouth every 6 (six) hours as needed (Nausea or vomiting). 30 tablet 1  . senna-docusate (SENOKOT-S) 8.6-50 MG tablet Take 1 tablet by mouth at bedtime as needed for mild constipation. 14 tablet 0  . traMADol (ULTRAM) 50 MG tablet Take 1 tablet (50 mg total) by mouth every 12 (twelve) hours as needed for moderate pain or severe  pain. 30 tablet 0   No current facility-administered medications for this visit.     REVIEW OF SYSTEMS:  Constitutional: Denies fevers, chills or abnormal night sweats  Eyes: Denies blurriness of vision, double vision or watery eyes Ears, nose, mouth, throat, and face: Denies mucositis or sore throat Respiratory: Denies cough, dyspnea or wheezes Cardiovascular: Denies palpitation, chest discomfort or lower extremity swelling Gastrointestinal:  Denies heartburn or change in bowel habits (+) nausea and vomiting Skin: Denies abnormal skin rashes Lymphatics: Denies new lymphadenopathy or easy bruising Neurological:Denies numbness, tingling or new weaknesses Behavioral/Psych: Mood is stable, no new changes  All other systems were reviewed with the patient and are negative.  PHYSICAL EXAMINATION: ECOG PERFORMANCE STATUS: 3  Vitals:   04/23/16 1211  BP: (!) 109/54  Pulse: (!) 124  Resp: 18  Temp: 98.7 F (37.1 C)   Filed Weights   04/23/16 1211  Weight: 119 lb 14.4 oz (54.4 kg)    GENERAL:alert, no distress and comfortable (+) jaundice SKIN: skin color, texture, turgor are normal, no rashes or significant lesions EYES: normal, conjunctiva are pink and non-injected, sclera clear OROPHARYNX:no exudate, no erythema and lips, buccal mucosa, and tongue normal  NECK: supple, thyroid normal size, non-tender, without nodularity LYMPH:  no palpable lymphadenopathy in the cervical, axillary or inguinal LUNGS: clear to auscultation and percussion with normal breathing effort HEART: regular rate & rhythm and no murmurs and no lower extremity edema ABDOMEN:abdomen soft, non-tender and normal bowel sounds Musculoskeletal:no cyanosis of digits and no clubbing  PSYCH: alert & oriented x 3 with fluent speech NEURO: no focal motor/sensory deficits    LABORATORY DATA:  I have reviewed the data as listed CBC Latest Ref Rng & Units 04/23/2016 04/19/2016 04/17/2016  WBC 4.0 - 10.3 10e3/uL 6.5  15.7(H) 11.6(H)  Hemoglobin 13.0 - 17.1 g/dL 10.9(L) 10.6(L) 12.4(L)  Hematocrit 38.4 - 49.9 % 33.0(L) 31.6(L) 37.6(L)  Platelets 140 - 400 10e3/uL 209 236 304   CMP Latest  Ref Rng & Units 04/23/2016 04/19/2016 04/17/2016  Glucose 70 - 140 mg/dl 239(H) 172(H) 361(H)  BUN 7.0 - 26.0 mg/dL 19.8 25(H) 25.4  Creatinine 0.7 - 1.3 mg/dL 1.1 1.01 1.2  Sodium 136 - 145 mEq/L 139 136 137  Potassium 3.5 - 5.1 mEq/L 4.0 3.8 4.3  Chloride 101 - 111 mmol/L - 101 -  CO2 22 - 29 mEq/L 25 26 30(H)  Calcium 8.4 - 10.4 mg/dL 9.1 8.3(L) 9.1  Total Protein 6.4 - 8.3 g/dL 6.4 5.8(L) 6.5  Total Bilirubin 0.20 - 1.20 mg/dL 1.74(H) 1.9(H) 2.29(H)  Alkaline Phos 40 - 150 U/L 388(H) 472(H) 751(H)  AST 5 - 34 U/L 41(H) 48(H) 50(H)  ALT 0 - 55 U/L 70(H) 83(H) 107(H)   PATHOLOGY REPORT  Diagnosis 03/01/2016 Liver, needle/core biopsy, Right Lobe - ADENOCARCINOMA. Microscopic Comment The features are consistent with metastatic adenocarcinoma. (JDP:kh 03/04/16)   RADIOGRAPHIC STUDIES: I have personally reviewed the radiological images as listed and agreed with the findings in the report. Dg Chest 2 View  Result Date: 04/19/2016 CLINICAL DATA:  [place] at home this morning. EXAM: CHEST  2 VIEW COMPARISON:  03/05/2016 FINDINGS: Skin folds across the right hemithorax. No pneumothorax is evident. The lungs are clear. Pulmonary vasculature is normal. Hilar, mediastinal and cardiac contours are unremarkable and unchanged. IMPRESSION: No acute cardiopulmonary disease. Electronically Signed   By: Andreas Newport M.D.   On: 04/19/2016 05:58   Ct Abdomen Pelvis W Contrast  Result Date: 04/11/2016 CLINICAL DATA:  Recently diagnosed pancreatic cancer. Chemotherapy started. Fever. Increased right upper quadrant pain. Biliary stent placed 11/17. EXAM: CT ABDOMEN AND PELVIS WITH CONTRAST TECHNIQUE: Multidetector CT imaging of the abdomen and pelvis was performed using the standard protocol following bolus administration of  intravenous contrast. CONTRAST:  194mL ISOVUE-300 IOPAMIDOL (ISOVUE-300) INJECTION 61% COMPARISON:  02/28/2016 all FINDINGS: Lower chest: Clear lung bases. Normal heart size without pericardial or pleural effusion. Hepatobiliary: Hepatic metastasis. Index hepatic dome lesion measures 3.1 cm in image 7/ series 7 versus 1.9 cm on the prior. An index lesion within the anterior right and medial left hepatic lobes measures 2.2 cm on image 19/series 7 versus 1.3 cm on the prior exam (when remeasured). Other lesions are new and enlarged. Borderline gallbladder distension is unchanged. Probable sludge in the gallbladder. No specific evidence of acute cholecystitis. Pneumobilia. Mild intrahepatic biliary duct dilatation, increased since the prior CT. Common duct stent originates at the level porta hepatis and terminates within been descending duodenum. The common duct measures maximally 1.6 cm on image 27/series 7 just above the stent. High-density material within the stent including on image 36/series 7. Pancreas: Pancreatic body/ tail atrophy with duct dilatation. This continues to the level of a pancreatic head/ uncinate process mass which measures 3.7 x 3.8 cm on image 45/series 7. Compare 3.5 x 2.8 cm on the prior. No acute pancreatitis. Spleen: Old granulomatous disease in the spleen. Adrenals/Urinary Tract: Normal adrenal glands. Right renal cysts. Normal left kidney, without hydronephrosis. Mild bladder wall thickening. Stomach/Bowel: Normal colon and terminal ileum.  Normal small bowel. Vascular/Lymphatic: Aortic and branch vessel atherosclerosis. Patent portal and hepatic veins. 10 mm portal caval node is not pathologic by size criteria but is enlarged from 6 mm on the prior. Necrotic peripancreatic adenopathy including at 1.4 cm anterior to the pancreatic head on image 46/series 7. Enlarged from 7 mm previously. No pelvic sidewall adenopathy. Reproductive: Moderate prostatomegaly. Probable dystrophic calcification  within the penis. Other: No significant free fluid. No evidence of  omental or peritoneal disease. Musculoskeletal: Lumbosacral spondylosis with convex right lumbar spine curvature. IMPRESSION: 1. Mild enlargement of pancreatic head/uncinate process mass and progression of hepatic metastasis. 2. Interval placement of a common duct stent. Since 02/28/2016, progression of biliary duct dilatation. Pneumobilia, arguing against complete stent obstruction. Hyperattenuation within the stent could be artifactual. Cannot exclude stones within. 3. Persistent borderline gallbladder distention without specific evidence of acute cholecystitis. 4. Progressive peripancreatic nodal metastasis. 5. Prostatomegaly with bladder wall thickening, likely secondary and related to a component of outlet obstruction. Electronically Signed   By: Abigail Miyamoto M.D.   On: 04/11/2016 08:23   US Abdomen Limited  Result Date: 03/25/2016 CLINICAL DATA:  Rule out biliary obstruction. Worsening jaundice. Liver metastasis from pancreatic cancer. EXAM: US ABDOMEN LIMITED - RIGHT UPPER QUADRANT COMPARISON:  CT of 02/28/2016 FINDINGS: Gallbladder: Moderate moderate gallbladder sludge. No pericholecystic fluid or wall thickening. Gallbladder distension, including at 12.4 cm. Sonographic Murphy's sign was not elicited. Common bile duct: Diameter: Moderate to markedly dilated, 19 mm. This is new. Echogenic material within is likely due to sludge. Liver: Hepatic metastasis, as on prior CT. Example within the right lobe of the liver at 1.7 cm. Intrahepatic ductal dilatation. IMPRESSION: 1. Development of moderate to marked biliary duct dilatation with common duct sludge. 2. Gallbladder sludge without acute cholecystitis. Gallbladder dilatation is also likely due to biliary obstruction. 3. Hepatic metastasis. Electronically Signed   By: Abigail Miyamoto M.D.   On: 03/25/2016 12:32   Dg Ercp Biliary & Pancreatic Ducts  Result Date: 03/29/2016 CLINICAL DATA:   Pancreatic carcinoma. Jaundice. Biliary obstruction. EXAM: ERCP TECHNIQUE: Multiple spot images obtained with the fluoroscopic device and submitted for interpretation post-procedure. COMPARISON:  CT on 02/28/2016 FINDINGS: ERCP performed by Dr. Watt Climes. Images show an abrupt stricture in the proximal to mid common bile duct. There is marked dilatation of the proximal common duct as well as moderate dilatation of visualized intrahepatic bile ducts. Subsequent images show placement of a wall stent in the common bile duct, with a persistent waist in the mid to distal common bile duct. IMPRESSION: Abrupt stricture in the proximal to mid common bile duct, with moderate proximal biliary ductal dilatation. This is consistent with stricture due to known pancreatic carcinoma. Successful placement of biliary stent in the common bile duct. These images were submitted for radiologic interpretation only. Please see the procedural report for the amount of contrast and the fluoroscopy time utilized. Electronically Signed   By: Earle Gell M.D.   On: 03/29/2016 10:53   Dg Hip Unilat W Or Wo Pelvis 2-3 Views Left  Result Date: 04/19/2016 CLINICAL DATA:  [place] at home this morning EXAM: DG HIP (WITH OR WITHOUT PELVIS) 2-3V LEFT COMPARISON:  None. FINDINGS: There is no evidence of hip fracture or dislocation. There is no evidence of arthropathy or other focal bone abnormality. IMPRESSION: Negative. Electronically Signed   By: Andreas Newport M.D.   On: 04/19/2016 05:59    ASSESSMENT & PLAN: 75 y.o. Caucasian male, with past medical history of hypertension, depression, anxiety, alcohol and tobacco abuse, presented with diarrhea and fall.  1. Metastatic pancreatic adenocarcinoma to liver -I previously reviewed his CT scan findings and the biopsy results with patient and his family members in details. -His liver biopsy confirmed metastatic adenocarcinoma, giving the large size of tumor in the pancreatic head, otherwise negative  CT scans, I think this is metastatic adenocarcinoma from pancreas.  -We previously discussed that his cancer is incurable at this stage, and  overall prognosis is very poor. Median survival is about 4-6 months. -We previously discussed the goal of therapy is palliative, to prolong his life and to preserve his quality of life. -We discussed systemic therapeutic options, including chemotherapy and immunotherapy. We discussed prior chemotherapy regiment and the response rate. Given his limited performance status, advanced age and medical comorbidities, I recommended him to start with single agent gemcitabine. If he tolerates well, I'll add Abraxane. I do not think he is a candidate for more intensive chemotherapy, such as FOLFIRINOX -Alternatively, palliative care alone, was also previously discussed with patient. He would like to try chemotherapy first. -His chemotherapy has been held after the first week of gemcitabine, due to obstructive jaundice and hospitalization. He overall has improved, performance status still poor, we discussed continuing chemotherapy versus palliative care alone with hospice. Patient strongly wish to try more chemotherapy. -Lab reviewed, will proceed weekly gemcitabine today, and he will be off chemo next week  -I have check with our pharmacist and found no interaction between Turmeric gemcitabine and the other his oral medications, I'm okay for him to take it, except as day before and 3 days after chemotherapy.  2. Hyperbilirubinemia -Likely secondary to his underlying malignancy (pancreatic cancer and liver metastasis), s/p CBD stent placement  -improving   3. HTN -His amlodipine, was found to have orthostatic hypotension, probably related to his diarrhea. -I encouraged him to monitor his blood pressure closely at home, and hold chemotherapy if his blood pressure is low or he feels dizzy. -We'll monitor his blood pressure closely during the chemotherapy.  4. Abdominal  pain -secondary to related to underlying malignancy  -He will continue use hydrocodone as needed, his son is concerned amount of Tylenol he will take with hydrocodone, I given him prescription of tramadol as alternate pain medication. Tramadol has some interaction with Prozac, and I recommend him to take no more than twice a day.   5. Nausea, fatigue and weight loss  -Continue Zofran as needed  -I encouraged him to take nutritional supplement  5. Anxiety, depression -Continue medication. I encouraged him to be positive   6. History of alcohol abuse, active smoker -I encouraged him to quit smoking, smoking cessation was discussed with patient and his family -He is willing to cut down his smoking   7. Goal of care and code status  -I previously discussed his goal of care is palliative, giving his terminal metastatic pancreas cancer. Patient and his son voiced good understanding and agree with it. -I recommend DO NOT RESUSCITATE and DO NOT INTUBATE, patient will think about it.   Plan: -Labs reviewed, will proceed chemo with week 2 gemcitabine 800mg /m2, chemo off next week -lab, f/u and chem in 2 weeks   All questions were answered. The patient knows to call the clinic with any problems, questions or concerns.  I spent 20 minutes counseling the patient face to face. The total time spent in the appointment was 25 minutes and more than 50% was on counseling.     Reginald Merle, MD 04/23/2016 3:28 PM   This document serves as a record of services personally performed by Reginald Merle, MD. It was created on her behalf by Darcus Austin, a trained medical scribe. The creation of this record is based on the scribe's personal observations and the provider's statements to them. This document has been checked and approved by the attending provider.

## 2016-04-23 ENCOUNTER — Ambulatory Visit (HOSPITAL_BASED_OUTPATIENT_CLINIC_OR_DEPARTMENT_OTHER): Payer: Medicare Other

## 2016-04-23 ENCOUNTER — Ambulatory Visit (HOSPITAL_BASED_OUTPATIENT_CLINIC_OR_DEPARTMENT_OTHER): Payer: Medicare Other | Admitting: Hematology

## 2016-04-23 ENCOUNTER — Encounter: Payer: Self-pay | Admitting: Hematology

## 2016-04-23 ENCOUNTER — Other Ambulatory Visit (HOSPITAL_BASED_OUTPATIENT_CLINIC_OR_DEPARTMENT_OTHER): Payer: Medicare Other

## 2016-04-23 VITALS — BP 109/54 | HR 124 | Temp 98.7°F | Resp 18 | Ht 66.0 in | Wt 119.9 lb

## 2016-04-23 VITALS — HR 113

## 2016-04-23 DIAGNOSIS — F1021 Alcohol dependence, in remission: Secondary | ICD-10-CM

## 2016-04-23 DIAGNOSIS — C259 Malignant neoplasm of pancreas, unspecified: Secondary | ICD-10-CM

## 2016-04-23 DIAGNOSIS — I10 Essential (primary) hypertension: Secondary | ICD-10-CM

## 2016-04-23 DIAGNOSIS — R109 Unspecified abdominal pain: Secondary | ICD-10-CM

## 2016-04-23 DIAGNOSIS — F418 Other specified anxiety disorders: Secondary | ICD-10-CM

## 2016-04-23 DIAGNOSIS — C787 Secondary malignant neoplasm of liver and intrahepatic bile duct: Secondary | ICD-10-CM

## 2016-04-23 DIAGNOSIS — Z720 Tobacco use: Secondary | ICD-10-CM

## 2016-04-23 DIAGNOSIS — F329 Major depressive disorder, single episode, unspecified: Secondary | ICD-10-CM

## 2016-04-23 DIAGNOSIS — Z5111 Encounter for antineoplastic chemotherapy: Secondary | ICD-10-CM

## 2016-04-23 DIAGNOSIS — R197 Diarrhea, unspecified: Secondary | ICD-10-CM

## 2016-04-23 DIAGNOSIS — R634 Abnormal weight loss: Secondary | ICD-10-CM

## 2016-04-23 DIAGNOSIS — C250 Malignant neoplasm of head of pancreas: Secondary | ICD-10-CM

## 2016-04-23 DIAGNOSIS — R530 Neoplastic (malignant) related fatigue: Secondary | ICD-10-CM

## 2016-04-23 DIAGNOSIS — F419 Anxiety disorder, unspecified: Secondary | ICD-10-CM

## 2016-04-23 LAB — COMPREHENSIVE METABOLIC PANEL
ALK PHOS: 388 U/L — AB (ref 40–150)
ALT: 70 U/L — ABNORMAL HIGH (ref 0–55)
AST: 41 U/L — ABNORMAL HIGH (ref 5–34)
Albumin: 2.9 g/dL — ABNORMAL LOW (ref 3.5–5.0)
Anion Gap: 11 mEq/L (ref 3–11)
BUN: 19.8 mg/dL (ref 7.0–26.0)
CO2: 25 meq/L (ref 22–29)
Calcium: 9.1 mg/dL (ref 8.4–10.4)
Chloride: 103 mEq/L (ref 98–109)
Creatinine: 1.1 mg/dL (ref 0.7–1.3)
EGFR: 65 mL/min/{1.73_m2} — AB (ref >90)
GLUCOSE: 239 mg/dL — AB (ref 70–140)
POTASSIUM: 4 meq/L (ref 3.5–5.1)
SODIUM: 139 meq/L (ref 136–145)
Total Bilirubin: 1.74 mg/dL — ABNORMAL HIGH (ref 0.20–1.20)
Total Protein: 6.4 g/dL (ref 6.4–8.3)

## 2016-04-23 LAB — CBC WITH DIFFERENTIAL/PLATELET
BASO%: 0.6 % (ref 0.0–2.0)
BASOS ABS: 0 10*3/uL (ref 0.0–0.1)
EOS ABS: 0.1 10*3/uL (ref 0.0–0.5)
EOS%: 0.8 % (ref 0.0–7.0)
HCT: 33 % — ABNORMAL LOW (ref 38.4–49.9)
HGB: 10.9 g/dL — ABNORMAL LOW (ref 13.0–17.1)
LYMPH%: 16.9 % (ref 14.0–49.0)
MCH: 30.1 pg (ref 27.2–33.4)
MCHC: 32.9 g/dL (ref 32.0–36.0)
MCV: 91.5 fL (ref 79.3–98.0)
MONO#: 0.4 10*3/uL (ref 0.1–0.9)
MONO%: 5.8 % (ref 0.0–14.0)
NEUT#: 4.9 10*3/uL (ref 1.5–6.5)
NEUT%: 75.9 % — ABNORMAL HIGH (ref 39.0–75.0)
Platelets: 209 10*3/uL (ref 140–400)
RBC: 3.6 10*6/uL — ABNORMAL LOW (ref 4.20–5.82)
RDW: 14.7 % — ABNORMAL HIGH (ref 11.0–14.6)
WBC: 6.5 10*3/uL (ref 4.0–10.3)
lymph#: 1.1 10*3/uL (ref 0.9–3.3)

## 2016-04-23 MED ORDER — PROCHLORPERAZINE MALEATE 10 MG PO TABS
10.0000 mg | ORAL_TABLET | Freq: Once | ORAL | Status: AC
  Administered 2016-04-23: 10 mg via ORAL

## 2016-04-23 MED ORDER — SODIUM CHLORIDE 0.9 % IV SOLN
Freq: Once | INTRAVENOUS | Status: AC
  Administered 2016-04-23: 14:00:00 via INTRAVENOUS

## 2016-04-23 MED ORDER — PROCHLORPERAZINE MALEATE 10 MG PO TABS
ORAL_TABLET | ORAL | Status: AC
  Filled 2016-04-23: qty 1

## 2016-04-23 MED ORDER — SODIUM CHLORIDE 0.9 % IV SOLN
800.0000 mg/m2 | Freq: Once | INTRAVENOUS | Status: AC
  Administered 2016-04-23: 1330 mg via INTRAVENOUS
  Filled 2016-04-23: qty 34.98

## 2016-04-23 NOTE — Patient Instructions (Signed)
[facility] Discharge Instructions for Patients Receiving Chemotherapy  Today you received the following chemotherapy agents Gemzar  To help prevent nausea and vomiting after your treatment, we encourage you to take your nausea medication as directed.    If you develop nausea and vomiting that is not controlled by your nausea medication, call the clinic.   BELOW ARE SYMPTOMS THAT SHOULD BE REPORTED IMMEDIATELY:  *FEVER GREATER THAN 100.5 F  *CHILLS WITH OR WITHOUT FEVER  NAUSEA AND VOMITING THAT IS NOT CONTROLLED WITH YOUR NAUSEA MEDICATION  *UNUSUAL SHORTNESS OF BREATH  *UNUSUAL BRUISING OR BLEEDING  TENDERNESS IN MOUTH AND THROAT WITH OR WITHOUT PRESENCE OF ULCERS  *URINARY PROBLEMS  *BOWEL PROBLEMS  UNUSUAL RASH Items with * indicate a potential emergency and should be followed up as soon as possible.  Feel free to call the clinic you have any questions or concerns. The clinic phone number is [phone].  Please show the CHEMO ALERT CARD at check-in to the Emergency Department and triage nurse.   

## 2016-04-23 NOTE — Progress Notes (Signed)
Ok to treat with HR today per MD Burr Medico

## 2016-04-24 LAB — CULTURE, BLOOD (ROUTINE X 2)
CULTURE: NO GROWTH
CULTURE: NO GROWTH

## 2016-04-25 ENCOUNTER — Other Ambulatory Visit: Payer: Self-pay | Admitting: Hematology

## 2016-04-29 ENCOUNTER — Other Ambulatory Visit: Payer: Self-pay | Admitting: *Deleted

## 2016-04-29 MED ORDER — PANCRELIPASE (LIP-PROT-AMYL) 24000-76000 UNITS PO CPEP: 1.0000 | ORAL_CAPSULE | Freq: Three times a day (TID) | ORAL | 1 refills | Status: AC

## 2016-04-30 ENCOUNTER — Other Ambulatory Visit: Payer: Self-pay

## 2016-04-30 ENCOUNTER — Emergency Department (HOSPITAL_COMMUNITY): Payer: Medicare Other

## 2016-04-30 ENCOUNTER — Emergency Department (HOSPITAL_COMMUNITY)
Admission: EM | Admit: 2016-04-30 | Discharge: 2016-04-30 | Disposition: A | Discharge status: Home / Self Care | Payer: Medicare Other | Attending: Emergency Medicine | Admitting: Emergency Medicine

## 2016-04-30 ENCOUNTER — Encounter (HOSPITAL_COMMUNITY): Payer: Self-pay

## 2016-04-30 ENCOUNTER — Telehealth: Payer: Self-pay | Admitting: *Deleted

## 2016-04-30 ENCOUNTER — Telehealth: Payer: Self-pay

## 2016-04-30 DIAGNOSIS — R531 Weakness: Secondary | ICD-10-CM

## 2016-04-30 DIAGNOSIS — J45909 Unspecified asthma, uncomplicated: Secondary | ICD-10-CM | POA: Diagnosis not present

## 2016-04-30 DIAGNOSIS — Z8507 Personal history of malignant neoplasm of pancreas: Secondary | ICD-10-CM | POA: Diagnosis not present

## 2016-04-30 DIAGNOSIS — I10 Essential (primary) hypertension: Secondary | ICD-10-CM | POA: Insufficient documentation

## 2016-04-30 DIAGNOSIS — Z8673 Personal history of transient ischemic attack (TIA), and cerebral infarction without residual deficits: Secondary | ICD-10-CM | POA: Diagnosis not present

## 2016-04-30 DIAGNOSIS — Z7982 Long term (current) use of aspirin: Secondary | ICD-10-CM | POA: Insufficient documentation

## 2016-04-30 DIAGNOSIS — D649 Anemia, unspecified: Secondary | ICD-10-CM | POA: Insufficient documentation

## 2016-04-30 DIAGNOSIS — R000 Tachycardia, unspecified: Secondary | ICD-10-CM | POA: Insufficient documentation

## 2016-04-30 DIAGNOSIS — F17210 Nicotine dependence, cigarettes, uncomplicated: Secondary | ICD-10-CM | POA: Diagnosis not present

## 2016-04-30 LAB — I-STAT TROPONIN, ED: Troponin i, poc: 0.02 ng/mL (ref 0.00–0.08)

## 2016-04-30 LAB — CBC
HCT: 28 % — ABNORMAL LOW (ref 39.0–52.0)
HEMOGLOBIN: 9.9 g/dL — AB (ref 13.0–17.0)
MCH: 31.2 pg (ref 26.0–34.0)
MCHC: 35.4 g/dL (ref 30.0–36.0)
MCV: 88.3 fL (ref 78.0–100.0)
PLATELETS: 122 10*3/uL — AB (ref 150–400)
RBC: 3.17 MIL/uL — AB (ref 4.22–5.81)
RDW: 14.3 % (ref 11.5–15.5)
WBC: 4.4 10*3/uL (ref 4.0–10.5)

## 2016-04-30 LAB — URINALYSIS, ROUTINE W REFLEX MICROSCOPIC
Bilirubin Urine: NEGATIVE
GLUCOSE, UA: NEGATIVE mg/dL
HGB URINE DIPSTICK: NEGATIVE
Ketones, ur: NEGATIVE mg/dL
Leukocytes, UA: NEGATIVE
Nitrite: NEGATIVE
Protein, ur: NEGATIVE mg/dL
Specific Gravity, Urine: 1.016 (ref 1.005–1.030)
pH: 5 (ref 5.0–8.0)

## 2016-04-30 LAB — COMPREHENSIVE METABOLIC PANEL
ALBUMIN: 3.2 g/dL — AB (ref 3.5–5.0)
ALT: 64 U/L — AB (ref 17–63)
AST: 89 U/L — ABNORMAL HIGH (ref 15–41)
Alkaline Phosphatase: 274 U/L — ABNORMAL HIGH (ref 38–126)
Anion gap: 8 (ref 5–15)
BUN: 18 mg/dL (ref 6–20)
CHLORIDE: 103 mmol/L (ref 101–111)
CO2: 26 mmol/L (ref 22–32)
CREATININE: 1.02 mg/dL (ref 0.61–1.24)
Calcium: 8.5 mg/dL — ABNORMAL LOW (ref 8.9–10.3)
GFR calc non Af Amer: >60 mL/min (ref >60)
GLUCOSE: 218 mg/dL — AB (ref 65–99)
Potassium: 3.4 mmol/L — ABNORMAL LOW (ref 3.5–5.1)
SODIUM: 137 mmol/L (ref 135–145)
Total Bilirubin: 1.3 mg/dL — ABNORMAL HIGH (ref 0.3–1.2)
Total Protein: 5.7 g/dL — ABNORMAL LOW (ref 6.5–8.1)

## 2016-04-30 LAB — TYPE AND SCREEN
ABO/RH(D): O POS
Antibody Screen: NEGATIVE

## 2016-04-30 LAB — ABO/RH: ABO/RH(D): O POS

## 2016-04-30 LAB — POC OCCULT BLOOD, ED: Fecal Occult Bld: NEGATIVE

## 2016-04-30 NOTE — Telephone Encounter (Signed)
Voicemail:  "Dr. Christa See, Joslyn Hy of Dr. Ernestina Penna patient.  Physical therapist monitoring him today concerned about erratic heart beat and rhythm ranging from 120 to 80 beats.  Is this a side effect of t he chemotherapy treatments or do we need a cardiologist?  Our PCP said to go to the ED. Call me at [phone].  We'll go to the Ed is we do not hear back."  Returned call notifying this is not a side effect of this chemotherapy and to report to the ED.  "We're on our way to [facility] ED now because the oncology area id there."   [facility] will transfer to [facility] if further cardiac assessment needed.

## 2016-04-30 NOTE — ED Notes (Addendum)
Xray at bedside. Patient made aware urine sample is needed. Urinal placed at bedside and encouraged to void when able.

## 2016-04-30 NOTE — Discharge Instructions (Signed)
Follow-up with your primary doctor or oncologist to have your blood count rechecked next week.  Return as needed for worsening symptoms.

## 2016-04-30 NOTE — Telephone Encounter (Signed)
Amber PT with [facility] was evaluating pt and his Hr was fluctuating from 120s to 80s back, no sob, no CP. Amber contacted PCP who said to send pt to ER. Dr Burr Medico not immediately available. Concurred and said to send pt to ER.  Amber was asking if a nurse consult from [facility] would be prudent. Verbal order given.

## 2016-04-30 NOTE — ED Notes (Signed)
Occult card placed at bedside for MD 

## 2016-04-30 NOTE — ED Triage Notes (Addendum)
Patient reports that PT came to visit today and patient's heart rate was between 120-129. Patient also c/o weakness. Patient denies any SOB or CP.

## 2016-04-30 NOTE — ED Provider Notes (Signed)
[place] DEPT Provider Note   CSN: [id number] Arrival date & time: 04/30/16  1635     History   Chief Complaint Chief Complaint  Patient presents with  . Weakness  . Tachycardia    HPI Reginald Ochoa is a 75 y.o. male.  HPI Pt was evaluated by home therapy today.  They noticed that his heart rate was fluctuating and fast.  His doctors were called and he was told to come to the ED.  Pt is getting treatment for stage 4  pancreatic cancer.  Last chemo was last Wednesday.  Pt denies any complaints of chest pain.  No shortness of breath. He does have generalized weakness.  No vomiting or diarrhea.  He has had an irregular and fast heart rate before but no specific diagnosis of an irregular heart rhythm Past Medical History:  Diagnosis Date  . Alcohol abuse   . Asthma   . Cancer [facility])    pancreatic with liver lesions  . Complication of anesthesia    PMH: hard to put to sleep  . Depression   . Depression with anxiety   . GERD (gastroesophageal reflux disease)   . GIB (gastrointestinal bleeding)   . Headache   . Heartburn   . Hypertension   . Neuromuscular disorder ([place])    pinched nerve  . Pancreatic mass   . Pneumonia   . TIA (transient ischemic attack)   . Tobacco abuse     Patient Active Problem List   Diagnosis Date Noted  . Goals of care, counseling/discussion 04/17/2016  . Severe protein-calorie malnutrition ([place])   . Abdominal pain 04/12/2016  . RUQ abdominal pain 04/11/2016  . Elevated alkaline phosphatase level 04/11/2016  . Fever 04/10/2016  . Unintentional weight loss 04/10/2016  . Transaminitis 04/10/2016  . Dehydration 04/10/2016  . Bunion 04/10/2016  . Pancreatic cancer metastasized to liver ([place]) 03/07/2016  . Tachycardia 03/05/2016  . Abnormal EKG 03/05/2016  . Tobacco abuse 02/29/2016  . Depression with anxiety 02/29/2016  . Fall 02/29/2016  . Syncope 02/29/2016  . AKI (acute kidney injury) ([place]) 02/29/2016  . Hypertension   .  Asthma exacerbation   . Alcohol abuse     Past Surgical History:  Procedure Laterality Date  . bleeding ulcer    . CATARACT EXTRACTION    . COLONOSCOPY    . ERCP N/A 03/29/2016   Procedure: ENDOSCOPIC RETROGRADE CHOLANGIOPANCREATOGRAPHY (ERCP);  Surgeon: Clarene Essex, MD;  Location: [facility] ENDOSCOPY;  Service: Endoscopy;  Laterality: N/A;  . MULTIPLE TOOTH EXTRACTIONS    . TONSILLECTOMY         Home Medications    Prior to Admission medications   Medication Sig Start Date End Date Taking? Authorizing Provider  albuterol (PROVENTIL HFA;VENTOLIN HFA) 108 (90 BASE) MCG/ACT inhaler Inhale 2 puffs into the lungs every 6 (six) hours as needed for wheezing or shortness of breath.   Yes Historical Provider, MD  ALPRAZolam Duanne Moron) 1 MG tablet Take 0.5-1 mg by mouth 2 (two) times daily as needed for anxiety. Anxiety    Yes Historical Provider, MD  Amino Acids-Protein Hydrolys (FEEDING SUPPLEMENT, PRO-STAT SUGAR FREE 64,) LIQD Take 30 mLs by mouth 3 (three) times daily. 04/13/16  Yes Barton Dubois, MD  amLODipine (NORVASC) 10 MG tablet Take 10 mg by mouth daily.   Yes Historical Provider, MD  amphetamine-dextroamphetamine (ADDERALL) 10 MG tablet Take 5-10 mg by mouth 2 (two) times daily with a meal. Pt takes one tablet with breakfast and one-half tablet with lunch.  Yes Historical Provider, MD  aspirin EC 81 MG tablet Take 1 tablet (81 mg total) by mouth daily. 04/13/16  Yes Barton Dubois, MD  budesonide-formoterol [facility]) 160-4.5 MCG/ACT inhaler Inhale 2 puffs into the lungs 2 (two) times daily.   Yes Historical Provider, MD  cetirizine (ZYRTEC) 10 MG tablet Take 10 mg by mouth at bedtime.   Yes Historical Provider, MD  cholecalciferol (VITAMIN D) 1000 units tablet Take 1,000 Units by mouth at bedtime.    Yes Historical Provider, MD  FLUoxetine (PROZAC) 40 MG capsule Take 40 mg by mouth daily.    Yes Historical Provider, MD  HYDROcodone-acetaminophen (NORCO) 10-325 MG tablet Take 1 tablet by mouth  every 8 (eight) hours as needed for severe pain. 04/13/16  Yes Barton Dubois, MD  ibuprofen (ADVIL,MOTRIN) 200 MG tablet Take 200 mg by mouth every 6 (six) hours as needed for headache, mild pain or moderate pain.    Yes Historical Provider, MD  LUTEIN PO Take 1 tablet by mouth daily.   Yes Historical Provider, MD  Multiple Vitamins-Minerals (MULTIVITAMIN WITH MINERALS) tablet Take 1 tablet by mouth daily.    Yes Historical Provider, MD  ondansetron (ZOFRAN ODT) 8 MG disintegrating tablet Take 1 tablet (8 mg total) by mouth every 8 (eight) hours as needed for nausea or vomiting. 03/22/16  Yes Truitt Merle, MD  oxymetazoline (AFRIN) 0.05 % nasal spray Place 1 spray into both nostrils 2 (two) times daily as needed for congestion.   Yes Historical Provider, MD  Pancrelipase, Lip-Prot-Amyl, (CREON) 24000-76000 units CPEP Take 1 capsule (24,000 Units total) by mouth 3 (three) times daily with meals. 04/29/16  Yes Truitt Merle, MD  polyethylene glycol [facility] / GLYCOLAX) packet Take 17 g by mouth daily as needed for mild constipation. 04/13/16  Yes Barton Dubois, MD  prochlorperazine (COMPAZINE) 10 MG tablet Take 10 mg by mouth every 6 (six) hours as needed for nausea or vomiting.   Yes Historical Provider, MD  senna-docusate (SENOKOT-S) 8.6-50 MG tablet Take 1 tablet by mouth at bedtime as needed for mild constipation. 03/07/16  Yes Lavina Hamman, MD  traMADol (ULTRAM) 50 MG tablet Take 1 tablet (50 mg total) by mouth every 12 (twelve) hours as needed for moderate pain or severe pain. 04/17/16  Yes Truitt Merle, MD    Family History Family History  Problem Relation Age of Onset  . Stroke Mother   . Cancer Maternal Uncle     bvrain tumor   . Cancer Cousin     brain tumor     Social History Social History  Substance Use Topics  . Smoking status: Current Every Day Smoker    Packs/day: 0.50    Years: 40.00    Types: Cigarettes, Cigars  . Smokeless tobacco: Never Used  . Alcohol use Yes     Comment: used to  be a heavy drinker and stopped 20 years ago       Allergies   Patient has no known allergies.   Review of Systems Review of Systems  All other systems reviewed and are negative.    Physical Exam Updated Vital Signs BP 126/80 (BP Location: Left Arm)   Pulse 90   Temp 98.5 F (36.9 C)   Resp 19   Ht 5\' 6"  (1.676 m)   Wt 54 kg   SpO2 99%   BMI 19.21 kg/m   Physical Exam  Constitutional: No distress.  Frail, elderly  HENT:  Head: Normocephalic and atraumatic.  Right Ear: External ear  normal.  Left Ear: External ear normal.  Eyes: Conjunctivae are normal. Right eye exhibits no discharge. Left eye exhibits no discharge. No scleral icterus.  Neck: Neck supple. No tracheal deviation present.  Cardiovascular: Intact distal pulses.  An irregular rhythm present. Tachycardia present.   Pulmonary/Chest: Effort normal and breath sounds normal. No stridor. No respiratory distress. He has no wheezes. He has no rales.  Abdominal: Soft. Bowel sounds are normal. He exhibits no distension. There is no tenderness. There is no rebound and no guarding.  Musculoskeletal: He exhibits no edema or tenderness.  Neurological: He is alert. No cranial nerve deficit (no facial droop, extraocular movements intact, no slurred speech) or sensory deficit. He exhibits normal muscle tone. He displays no seizure activity. Coordination normal.  Generalized weakness, no focal deficits  Skin: Skin is warm and dry. No rash noted. There is pallor.  Psychiatric: He has a normal mood and affect.  Nursing note and vitals reviewed.    ED Treatments / Results  Labs (all labs ordered are listed, but only abnormal results are displayed) Labs Reviewed  COMPREHENSIVE METABOLIC PANEL - Abnormal; Notable for the following:       Result Value   Potassium 3.4 (*)    Glucose, Bld 218 (*)    Calcium 8.5 (*)    Total Protein 5.7 (*)    Albumin 3.2 (*)    AST 89 (*)    ALT 64 (*)    Alkaline Phosphatase 274 (*)     Total Bilirubin 1.3 (*)    All other components within normal limits  CBC - Abnormal; Notable for the following:    RBC 3.17 (*)    Hemoglobin 9.9 (*)    HCT 28.0 (*)    Platelets 122 (*)    All other components within normal limits  URINALYSIS, ROUTINE W REFLEX MICROSCOPIC - Abnormal; Notable for the following:    Color, Urine AMBER (*)    APPearance HAZY (*)    All other components within normal limits  I-STAT TROPOININ, ED  POC OCCULT BLOOD, ED  TYPE AND SCREEN  ABO/RH    EKG  EKG Interpretation  Date/Time:  Tuesday April 30 2016 16:47:43 EST Ventricular Rate:  104 PR Interval:    QRS Duration: 100 QT Interval:  382 QTC Calculation: 503 R Axis:   149 Text Interpretation:  Undetermined rhtyhm, tachycardia Multiform ventricular premature complexes Right axis deviation Prolonged QT interval Baseline wander in lead(s) V2 V3 Poor data quality in current ECG precludes serial comparison Confirmed by Rai Severns  MD-J, Kaitland Lewellyn ([id number]) on 04/30/2016 5:03:02 PM       EKG Interpretation  Date/Time:  Tuesday April 30 2016 17:18:40 EST Ventricular Rate:  111 PR Interval:    QRS Duration: 102 QT Interval:  395 QTC Calculation: 537 R Axis:   103 Text Interpretation:  Sinus or ectopic atrial tachycardia Multiple premature complexes, vent & supraven Right axis deviation Prolonged QT interval Since last tracing rate slower Confirmed by Eathen Budreau  MD-J, Nelva Hauk [id number]) on 04/30/2016 5:47:19 PM        Radiology Dg Chest Portable 1 View  Result Date: 04/30/2016 CLINICAL DATA:  75 year old male with a history of weakness and tachycardia EXAM: PORTABLE CHEST 1 VIEW COMPARISON:  04/19/2016, 03/05/2016 FINDINGS: Cardiomediastinal silhouette unchanged. No pleural effusion, pneumothorax, or confluent airspace disease. Nipple shadow over the right lower chest. No displaced fracture. IMPRESSION: No radiographic evidence of acute cardiopulmonary disease. Signed, Dulcy Fanny. Earleen Newport, DO [facility]  Radiology Electronically Signed   By: Corrie Mckusick D.O.   On: 04/30/2016 18:15    Procedures Procedures (including critical care time)  Medications Ordered in ED Medications - No data to display   Initial Impression / Assessment and Plan / ED Course  I have reviewed the triage vital signs and the nursing notes.  Pertinent labs & imaging results that were available during my care of the patient were reviewed by me and considered in my medical decision making (see chart for details).  Clinical Course   Patient presented to the emergency room for evaluation of tachycardia.  EKG showed a combination of sinus tachycardia as well as an ectopic atrial tachycardia.  While the patient was in the emergency room his heart rate was monitored and stayed stable the 90s and low 100s. Tests do show worsening anemia compared to previous values. There is no evidence of rectal bleeding and this most likely is related to his chemotherapy treatment. The patient's hemoglobin is 9.9 right now and I do not feel he requires a blood transfusion.  We discussed follow-up with his primary doctor to have his blood count rechecked in the next week or so. Monitor for fever or worsening symptoms.  Final Clinical Impressions(s) / ED Diagnoses   Final diagnoses:  Generalized weakness  Anemia, unspecified type  Sinus tachycardia    New Prescriptions New Prescriptions   No medications on file     Dorie Rank, MD 04/30/16 2009

## 2016-04-30 NOTE — ED Notes (Signed)
ED Provider at bedside. 

## 2016-05-08 ENCOUNTER — Ambulatory Visit (HOSPITAL_BASED_OUTPATIENT_CLINIC_OR_DEPARTMENT_OTHER): Payer: Medicare Other | Admitting: Hematology

## 2016-05-08 ENCOUNTER — Other Ambulatory Visit (HOSPITAL_BASED_OUTPATIENT_CLINIC_OR_DEPARTMENT_OTHER): Payer: Medicare Other

## 2016-05-08 ENCOUNTER — Telehealth: Payer: Self-pay | Admitting: Hematology

## 2016-05-08 ENCOUNTER — Ambulatory Visit (HOSPITAL_BASED_OUTPATIENT_CLINIC_OR_DEPARTMENT_OTHER): Payer: Medicare Other

## 2016-05-08 VITALS — BP 101/64 | HR 83 | Temp 98.8°F | Resp 18 | Ht 66.0 in | Wt 116.2 lb

## 2016-05-08 VITALS — BP 123/76 | HR 95 | Resp 18

## 2016-05-08 DIAGNOSIS — E860 Dehydration: Secondary | ICD-10-CM

## 2016-05-08 DIAGNOSIS — C259 Malignant neoplasm of pancreas, unspecified: Secondary | ICD-10-CM

## 2016-05-08 DIAGNOSIS — C250 Malignant neoplasm of head of pancreas: Secondary | ICD-10-CM

## 2016-05-08 DIAGNOSIS — C787 Secondary malignant neoplasm of liver and intrahepatic bile duct: Principal | ICD-10-CM

## 2016-05-08 DIAGNOSIS — I10 Essential (primary) hypertension: Secondary | ICD-10-CM

## 2016-05-08 DIAGNOSIS — R5383 Other fatigue: Secondary | ICD-10-CM

## 2016-05-08 DIAGNOSIS — F17210 Nicotine dependence, cigarettes, uncomplicated: Secondary | ICD-10-CM

## 2016-05-08 DIAGNOSIS — R110 Nausea: Secondary | ICD-10-CM

## 2016-05-08 DIAGNOSIS — F418 Other specified anxiety disorders: Secondary | ICD-10-CM

## 2016-05-08 DIAGNOSIS — R634 Abnormal weight loss: Secondary | ICD-10-CM

## 2016-05-08 DIAGNOSIS — R109 Unspecified abdominal pain: Secondary | ICD-10-CM

## 2016-05-08 LAB — CBC WITH DIFFERENTIAL/PLATELET
BASO%: 0.2 % (ref 0.0–2.0)
Basophils Absolute: 0 10*3/uL (ref 0.0–0.1)
EOS%: 0.7 % (ref 0.0–7.0)
Eosinophils Absolute: 0.1 10*3/uL (ref 0.0–0.5)
HCT: 32.8 % — ABNORMAL LOW (ref 38.4–49.9)
HGB: 11.1 g/dL — ABNORMAL LOW (ref 13.0–17.1)
LYMPH#: 0.8 10*3/uL — AB (ref 0.9–3.3)
LYMPH%: 7.4 % — AB (ref 14.0–49.0)
MCH: 30.7 pg (ref 27.2–33.4)
MCHC: 33.8 g/dL (ref 32.0–36.0)
MCV: 90.9 fL (ref 79.3–98.0)
MONO#: 1.4 10*3/uL — ABNORMAL HIGH (ref 0.1–0.9)
MONO%: 13.5 % (ref 0.0–14.0)
NEUT%: 78.2 % — AB (ref 39.0–75.0)
NEUTROS ABS: 8.2 10*3/uL — AB (ref 1.5–6.5)
Platelets: 364 10*3/uL (ref 140–400)
RBC: 3.61 10*6/uL — AB (ref 4.20–5.82)
RDW: 15.9 % — ABNORMAL HIGH (ref 11.0–14.6)
WBC: 10.5 10*3/uL — AB (ref 4.0–10.3)

## 2016-05-08 LAB — COMPREHENSIVE METABOLIC PANEL
ALT: 184 U/L — AB (ref 0–55)
AST: 133 U/L — AB (ref 5–34)
Albumin: 3 g/dL — ABNORMAL LOW (ref 3.5–5.0)
Alkaline Phosphatase: 1094 U/L — ABNORMAL HIGH (ref 40–150)
Anion Gap: 9 mEq/L (ref 3–11)
BUN: 19.6 mg/dL (ref 7.0–26.0)
CHLORIDE: 103 meq/L (ref 98–109)
CO2: 25 meq/L (ref 22–29)
CREATININE: 1.1 mg/dL (ref 0.7–1.3)
Calcium: 8.8 mg/dL (ref 8.4–10.4)
EGFR: 69 mL/min/{1.73_m2} — ABNORMAL LOW (ref >90)
GLUCOSE: 209 mg/dL — AB (ref 70–140)
Potassium: 4.1 mEq/L (ref 3.5–5.1)
Sodium: 136 mEq/L (ref 136–145)
TOTAL PROTEIN: 6.2 g/dL — AB (ref 6.4–8.3)
Total Bilirubin: 3.18 mg/dL — ABNORMAL HIGH (ref 0.20–1.20)

## 2016-05-08 MED ORDER — SODIUM CHLORIDE 0.9 % IV SOLN
Freq: Once | INTRAVENOUS | Status: AC
  Administered 2016-05-08: 17:00:00 via INTRAVENOUS

## 2016-05-08 MED ORDER — LEVOFLOXACIN 500 MG PO TABS: 500.0000 mg | ORAL_TABLET | Freq: Every day | ORAL | 0 refills | Status: DC

## 2016-05-08 MED ORDER — DRONABINOL 2.5 MG PO CAPS: 2.5000 mg | ORAL_CAPSULE | Freq: Two times a day (BID) | ORAL | 1 refills | Status: AC

## 2016-05-08 NOTE — Telephone Encounter (Signed)
Follow up and lab appointments scheduled for 05/17/16, per 05/08/16 los. Patient bypassed scheduling area. Patient will pick up a copy of the appointment schedule and AVS report in Infusion area.

## 2016-05-08 NOTE — Patient Instructions (Signed)
Dehydration, Adult Dehydration is a condition in which there is not enough fluid or water in the body. This happens when you lose more fluids than you take in. Important organs, such as the kidneys, brain, and heart, cannot function without a proper amount of fluids. Any loss of fluids from the body can lead to dehydration. Dehydration can range from mild to severe. This condition should be treated right away to prevent it from becoming severe. What are the causes? This condition may be caused by:  Vomiting.  Diarrhea.  Excessive sweating, such as from heat exposure or exercise.  Not drinking enough fluid, especially:  When ill.  While doing activity that requires a lot of energy.  Excessive urination.  Fever.  Infection.  Certain medicines, such as medicines that cause the body to lose excess fluid (diuretics).  Inability to access safe drinking water.  Reduced physical ability to get adequate water and food. What increases the risk? This condition is more likely to develop in people:  Who have a poorly controlled long-term (chronic) illness, such as diabetes, heart disease, or kidney disease.  Who are age 65 or older.  Who are disabled.  Who live in a place with high altitude.  Who play endurance sports. What are the signs or symptoms? Symptoms of mild dehydration may include:   Thirst.  Dry lips.  Slightly dry mouth.  Dry, warm skin.  Dizziness. Symptoms of moderate dehydration may include:   Very dry mouth.  Muscle cramps.  Dark urine. Urine may be the color of tea.  Decreased urine production.  Decreased tear production.  Heartbeat that is irregular or faster than normal (palpitations).  Headache.  Light-headedness, especially when you stand up from a sitting position.  Fainting (syncope). Symptoms of severe dehydration may include:   Changes in skin, such as:  Cold and clammy skin.  Blotchy (mottled) or pale skin.  Skin that does  not quickly return to normal after being lightly pinched and released (poor skin turgor).  Changes in body fluids, such as:  Extreme thirst.  No tear production.  Inability to sweat when body temperature is high, such as in hot weather.  Very little urine production.  Changes in vital signs, such as:  Weak pulse.  Pulse that is more than 100 beats a minute when sitting still.  Rapid breathing.  Low blood pressure.  Other changes, such as:  Sunken eyes.  Cold hands and feet.  Confusion.  Lack of energy (lethargy).  Difficulty waking up from sleep.  Short-term weight loss.  Unconsciousness. How is this diagnosed? This condition is diagnosed based on your symptoms and a physical exam. Blood and urine tests may be done to help confirm the diagnosis. How is this treated? Treatment for this condition depends on the severity. Mild or moderate dehydration can often be treated at home. Treatment should be started right away. Do not wait until dehydration becomes severe. Severe dehydration is an emergency and it needs to be treated in a hospital. Treatment for mild dehydration may include:   Drinking more fluids.  Replacing salts and minerals in your blood (electrolytes) that you may have lost. Treatment for moderate dehydration may include:   Drinking an oral rehydration solution (ORS). This is a drink that helps you replace fluids and electrolytes (rehydrate). It can be found at pharmacies and retail stores. Treatment for severe dehydration may include:   Receiving fluids through an IV tube.  Receiving an electrolyte solution through a feeding tube that is   passed through your nose and into your stomach (nasogastric tube, or NG tube).  Correcting any abnormalities in electrolytes.  Treating the underlying cause of dehydration. Follow these instructions at home:  If directed by your health care provider, drink an ORS:  Make an ORS by following instructions on the  package.  Start by drinking small amounts, about  cup (120 mL) every 5-10 minutes.  Slowly increase how much you drink until you have taken the amount recommended by your health care provider.  Drink enough clear fluid to keep your urine clear or pale yellow. If you were told to drink an ORS, finish the ORS first, then start slowly drinking other clear fluids. Drink fluids such as:  Water. Do not drink only water. Doing that can lead to having too little salt (sodium) in the body (hyponatremia).  Ice chips.  Fruit juice that you have added water to (diluted fruit juice).  Low-calorie sports drinks.  Avoid:  Alcohol.  Drinks that contain a lot of sugar. These include high-calorie sports drinks, fruit juice that is not diluted, and soda.  Caffeine.  Foods that are greasy or contain a lot of fat or sugar.  Take over-the-counter and prescription medicines only as told by your health care provider.  Do not take sodium tablets. This can lead to having too much sodium in the body (hypernatremia).  Eat foods that contain a healthy balance of electrolytes, such as bananas, oranges, potatoes, tomatoes, and spinach.  Keep all follow-up visits as told by your health care provider. This is important. Contact a health care provider if:  You have abdominal pain that:  Gets worse.  Stays in one area (localizes).  You have a rash.  You have a stiff neck.  You are more irritable than usual.  You are sleepier or more difficult to wake up than usual.  You feel weak or dizzy.  You feel very thirsty.  You have urinated only a small amount of very dark urine over 6-8 hours. Get help right away if:  You have symptoms of severe dehydration.  You cannot drink fluids without vomiting.  Your symptoms get worse with treatment.  You have a fever.  You have a severe headache.  You have vomiting or diarrhea that:  Gets worse.  Does not go away.  You have blood or green matter  (bile) in your vomit.  You have blood in your stool. This may cause stool to look black and tarry.  You have not urinated in 6-8 hours.  You faint.  Your heart rate while sitting still is over 100 beats a minute.  You have trouble breathing. This information is not intended to replace advice given to you by your health care provider. Make sure you discuss any questions you have with your health care provider. Document Released: 04/29/2005 Document Revised: 11/24/2015 Document Reviewed: 06/23/2015 Elsevier Interactive Patient Education  2017 [employer]  

## 2016-05-08 NOTE — Progress Notes (Signed)
[place]  Telephone:([phone]) [phone] Fax:(336) [phone]  Clinic Follow Up Note   Patient Care Team: Reginald Lei, MD as PCP - General (Family Medicine) 05/08/2016   CHIEF COMPLAINTS:  Follow up metastatic pancreatic adenocarcinoma  Oncology History   Pancreatic cancer metastasized to liver [facility])   Staging form: Pancreas, AJCC 7th Edition   - Clinical stage from 03/01/2016: Stage IV (T2, N1, M1) - Signed by Truitt Merle, MD on 03/07/2016      Pancreatic cancer metastasized to liver [facility])   02/28/2016 - 03/01/2016 Hospital Admission    Pt presented with syncope and diarrhea. c-diff (-), he underwent liver biopsy and CT scans, and was discharged home.       03/01/2016 Initial Diagnosis    Pancreatic cancer metastasized to liver ([place])      03/01/2016 Initial Biopsy    Liver biopsy showed adenocarcinoma, features are consistent with metastatic adenocarcinoma      03/01/2016 Imaging    CT chest, abdomen and pelvis with contrast showed hyperenhancing mass in the head and uncinate process of the pancreas, with small nodular density anterior to the mass is concerning for cellulitis lesions, multiple hepatic hypodensities lesions most compatible with metastatic disease.      03/01/2016 Tumor Marker    CEA 10.5, CA19.9 2         HISTORY OF PRESENTING ILLNESS (03/08/2016):  Reginald Ochoa 75 y.o. male with past medical history of hypertension, asthma, depression, anxiety, and alcohol abuse, is here because of His recent diagnosed metastatic pancreatic adenocarcinoma. He is accompanied by his wife Reginald Ochoa and son Reginald Ochoa to my clinic today.  He has been having mild diarrhea for the past 2-3 weeks, lose stool, 3-5 times a day. 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 He may lost consciousness for a short period time. He was found by EMS which happened to be there, and was send to ED.He was found to have positive orthostatic vital signs  in ED. CT abdomen/pelvis showed possible pancreatic cancerwith possible liver metastasis. He was admitted to the hospital, underwent liver biopsy, and was discharged home the next day.   He has no nausea, no pain, diarrhea improved lately,  2-3 times a day, he has mild fatigue, appetite is good. 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 He is able to do all his ADLs, but not much housework or other activities.   He had three TIA, which affected his vision, he does not see well in the dark. He lives with his wife.  He has been on oxycodone since 1998 for head and neck pain since he had fall and neck injury.  he used to drink alcohol heavily, but has stopped 20 years ago. He has history of heavy smoking, still smokes half pack cigarettes a day.  CURRENT THERAPY: First line chemotherapy weekly gemcitabine 1000mg /m2, 2 weeks on, one-week off, started on 04/11/2016, second dose was postponed to 04/17/2016 due to obstructive jaundice and hospitalization. Reduced to 800mg /m2 from 04/17/2016 due to poor performance status.  Interval History: Reginald Ochoa returns for follow up and his 2nd cycle of chemo. The patient presented to the ED on 04/30/16 for tachycardia which was found by his physical therapist. He was discharged back to home after negative workup. He had chemo break last week.  He reports not feeling much of a difference not being on chemo. The patient's sons were present and they report he's  not eating much. He reports right sided abdominal pain that he rates as a 4-5/10. He is taking Tramadol for the pain.  MEDICAL HISTORY:  Past Medical History:  Diagnosis Date  . Alcohol abuse   . Asthma   . Cancer [facility])    pancreatic with liver lesions  . Complication of anesthesia    PMH: hard to put to sleep  . Depression   . Depression with anxiety   . GERD (gastroesophageal reflux disease)   . GIB (gastrointestinal bleeding)   . Headache   .  Heartburn   . Hypertension   . Neuromuscular disorder ([place])    pinched nerve  . Pancreatic mass   . Pneumonia   . TIA (transient ischemic attack)   . Tobacco abuse     SURGICAL HISTORY: Past Surgical History:  Procedure Laterality Date  . bleeding ulcer    . CATARACT EXTRACTION    . COLONOSCOPY    . ERCP N/A 03/29/2016   Procedure: ENDOSCOPIC RETROGRADE CHOLANGIOPANCREATOGRAPHY (ERCP);  Surgeon: Clarene Essex, MD;  Location: [facility] ENDOSCOPY;  Service: Endoscopy;  Laterality: N/A;  . MULTIPLE TOOTH EXTRACTIONS    . TONSILLECTOMY      SOCIAL HISTORY: Social History   Social History  . Marital status: Married    Spouse name: Reginald Ochoa  . Number of children: 3  . Years of education: N/A   Occupational History  . Author     Architect   Social History Main Topics  . Smoking status: Current Every Day Smoker    Packs/day: 0.50    Years: 40.00    Types: Cigarettes, Cigars  . Smokeless tobacco: Never Used  . Alcohol use Yes     Comment: used to be a heavy drinker and stopped 20 years ago    . Drug use: No  . Sexual activity: Not on file   Other Topics Concern  . Not on file   Social History Narrative   Married to wife, Reginald Ochoa for 35+ years. 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 Stroke Mother   . Cancer Maternal Uncle     bvrain tumor   . Cancer Cousin     brain tumor     ALLERGIES:  has No Known Allergies.  MEDICATIONS:  Current Outpatient Prescriptions  Medication Sig Dispense Refill  . albuterol (PROVENTIL HFA;VENTOLIN HFA) 108 (90 BASE) MCG/ACT inhaler Inhale 2 puffs into the lungs every 6 (six) hours as needed for wheezing or shortness of breath.    . ALPRAZolam (XANAX) 1 MG tablet Take 0.5-1 mg by mouth 2 (two) times daily as  needed for anxiety. Anxiety     . amLODipine (NORVASC) 10 MG tablet Take 10 mg by mouth daily.    Marland Kitchen amphetamine-dextroamphetamine (ADDERALL) 10 MG tablet Take 5-10 mg by mouth 2 (two) times daily with a meal. Pt takes one tablet with breakfast and one-half tablet with lunch.    Marland Kitchen aspirin EC 81 MG tablet Take 1 tablet (81 mg total) by mouth daily. 30 tablet 0  . budesonide-formoterol (SYMBICORT) 160-4.5 MCG/ACT inhaler Inhale 2 puffs into the lungs 2 (two) times daily.    . cetirizine (ZYRTEC) 10 MG tablet Take 10 mg by mouth at bedtime.    Marland Kitchen  cholecalciferol (VITAMIN D) 1000 units tablet Take 1,000 Units by mouth at bedtime.     Marland Kitchen FLUoxetine (PROZAC) 40 MG capsule Take 40 mg by mouth daily.     Marland Kitchen HYDROcodone-acetaminophen (NORCO) 10-325 MG tablet Take 1 tablet by mouth every 8 (eight) hours as needed for severe pain. 30 tablet 0  . ibuprofen (ADVIL,MOTRIN) 200 MG tablet Take 200 mg by mouth every 6 (six) hours as needed for headache, mild pain or moderate pain.     Marland Kitchen LUTEIN PO Take 1 tablet by mouth daily.    . Multiple Vitamins-Minerals (MULTIVITAMIN WITH MINERALS) tablet Take 1 tablet by mouth daily.     . ondansetron (ZOFRAN ODT) 8 MG disintegrating tablet Take 1 tablet (8 mg total) by mouth every 8 (eight) hours as needed for nausea or vomiting. 30 tablet 1  . oxymetazoline (AFRIN) 0.05 % nasal spray Place 1 spray into both nostrils 2 (two) times daily as needed for congestion.    . Pancrelipase, Lip-Prot-Amyl, (CREON) 24000-76000 units CPEP Take 1 capsule (24,000 Units total) by mouth 3 (three) times daily with meals. 90 capsule 1  . polyethylene glycol (MIRALAX / GLYCOLAX) packet Take 17 g by mouth daily as needed for mild constipation. 28 each 0  . prochlorperazine (COMPAZINE) 10 MG tablet Take 10 mg by mouth every 6 (six) hours as needed for nausea or vomiting.    . traMADol (ULTRAM) 50 MG tablet Take 1 tablet (50 mg total) by mouth every 12 (twelve) hours as needed for moderate pain or  severe pain. 30 tablet 0  . Amino Acids-Protein Hydrolys (FEEDING SUPPLEMENT, PRO-STAT SUGAR FREE 64,) LIQD Take 30 mLs by mouth 3 (three) times daily. (Patient not taking: Reported on 05/08/2016) 900 mL 0  . dronabinol (MARINOL) 2.5 MG capsule Take 1 capsule (2.5 mg total) by mouth 2 (two) times daily before a meal. 30 capsule 1  . levofloxacin (LEVAQUIN) 500 MG tablet Take 1 tablet (500 mg total) by mouth daily. 10 tablet 0  . senna-docusate (SENOKOT-S) 8.6-50 MG tablet Take 1 tablet by mouth at bedtime as needed for mild constipation. (Patient not taking: Reported on 05/08/2016) 14 tablet 0   No current facility-administered medications for this visit.     REVIEW OF SYSTEMS:  Constitutional: Denies fevers, chills or abnormal night sweats (+) fatigue (+) weight loss (+) poor appetite Eyes: Denies blurriness of vision, double vision or watery eyes Ears, nose, mouth, throat, and face: Denies mucositis or sore throat Respiratory: Denies cough, dyspnea or wheezes Cardiovascular: Denies palpitation, chest discomfort or lower extremity swelling Gastrointestinal:  Denies heartburn or change in bowel habits (+) nausea and vomiting Skin: Denies abnormal skin rashes Lymphatics: Denies new lymphadenopathy or easy bruising Neurological:Denies numbness, tingling or new weaknesses Behavioral/Psych: Mood is stable, no new changes  All other systems were reviewed with the patient and are negative.  PHYSICAL EXAMINATION: ECOG PERFORMANCE STATUS: 3  Vitals:   05/08/16 1520  BP: 101/64  Pulse: 83  Resp: 18  Temp: 98.8 F (37.1 C)   Filed Weights   05/08/16 1520  Weight: 116 lb 3.2 oz (52.7 kg)    GENERAL:alert, no distress and comfortable (+) jaundice SKIN: skin color, texture, turgor are normal, no rashes or significant lesions EYES: normal, conjunctiva are pink and non-injected, sclera clear OROPHARYNX:no exudate, no erythema and lips, buccal mucosa, and tongue normal  NECK: supple,  thyroid normal size, non-tender, without nodularity LYMPH:  no palpable lymphadenopathy in the cervical, axillary or inguinal LUNGS: clear to  auscultation and percussion with normal breathing effort HEART: regular rate & rhythm and no murmurs and no lower extremity edema ABDOMEN:abdomen soft, non-tender and normal bowel sounds Musculoskeletal:no cyanosis of digits and no clubbing  PSYCH: alert & oriented x 3 with fluent speech NEURO: no focal motor/sensory deficits    LABORATORY DATA:  I have reviewed the data as listed CBC Latest Ref Rng & Units 05/08/2016 04/30/2016 04/23/2016  WBC 4.0 - 10.3 10e3/uL 10.5(H) 4.4 6.5  Hemoglobin 13.0 - 17.1 g/dL 11.1(L) 9.9(L) 10.9(L)  Hematocrit 38.4 - 49.9 % 32.8(L) 28.0(L) 33.0(L)  Platelets 140 - 400 10e3/uL 364 122(L) 209   CMP Latest Ref Rng & Units 05/08/2016 04/30/2016 04/23/2016  Glucose 70 - 140 mg/dl 209(H) 218(H) 239(H)  BUN 7.0 - 26.0 mg/dL 19.6 18 19.8  Creatinine 0.7 - 1.3 mg/dL 1.1 1.02 1.1  Sodium 136 - 145 mEq/L 136 137 139  Potassium 3.5 - 5.1 mEq/L 4.1 3.4(L) 4.0  Chloride 101 - 111 mmol/L - 103 -  CO2 22 - 29 mEq/L 25 26 25   Calcium 8.4 - 10.4 mg/dL 8.8 8.5(L) 9.1  Total Protein 6.4 - 8.3 g/dL 6.2(L) 5.7(L) 6.4  Total Bilirubin 0.20 - 1.20 mg/dL 3.18(H) 1.3(H) 1.74(H)  Alkaline Phos 40 - 150 U/L 1,094(H) 274(H) 388(H)  AST 5 - 34 U/L 133(H) 89(H) 41(H)  ALT 0 - 55 U/L 184(H) 64(H) 70(H)   PATHOLOGY REPORT  Diagnosis 03/01/2016 Liver, needle/core biopsy, Right Lobe - ADENOCARCINOMA. Microscopic Comment The features are consistent with metastatic adenocarcinoma. (JDP:kh 03/04/16)   RADIOGRAPHIC STUDIES: I have personally reviewed the radiological images as listed and agreed with the findings in the report. Dg Chest 2 View  Result Date: 04/19/2016 CLINICAL DATA:  [place] at home this morning. EXAM: CHEST  2 VIEW COMPARISON:  03/05/2016 FINDINGS: Skin folds across the right hemithorax. No pneumothorax is evident. The lungs  are clear. Pulmonary vasculature is normal. Hilar, mediastinal and cardiac contours are unremarkable and unchanged. IMPRESSION: No acute cardiopulmonary disease. Electronically Signed   By: Andreas Newport M.D.   On: 04/19/2016 05:58   Dg Chest Portable 1 View  Result Date: 04/30/2016 CLINICAL DATA:  75 year old male with a history of weakness and tachycardia EXAM: PORTABLE CHEST 1 VIEW COMPARISON:  04/19/2016, 03/05/2016 FINDINGS: Cardiomediastinal silhouette unchanged. No pleural effusion, pneumothorax, or confluent airspace disease. Nipple shadow over the right lower chest. No displaced fracture. IMPRESSION: No radiographic evidence of acute cardiopulmonary disease. Signed, Dulcy Fanny. Earleen Newport, DO [facility] Electronically Signed   By: Corrie Mckusick D.O.   On: 04/30/2016 18:15   Dg Hip Unilat W Or Wo Pelvis 2-3 Views Left  Result Date: 04/19/2016 CLINICAL DATA:  [place] at home this morning EXAM: DG HIP (WITH OR WITHOUT PELVIS) 2-3V LEFT COMPARISON:  None. FINDINGS: There is no evidence of hip fracture or dislocation. There is no evidence of arthropathy or other focal bone abnormality. IMPRESSION: Negative. Electronically Signed   By: Andreas Newport M.D.   On: 04/19/2016 05:59    ASSESSMENT & PLAN: 75 y.o. Caucasian male, with past medical history of hypertension, depression, anxiety, alcohol and tobacco abuse, presented with diarrhea and fall.  1. Metastatic pancreatic adenocarcinoma to liver -I previously reviewed his CT scan findings and the biopsy results with patient and his family members in details. -His liver biopsy confirmed metastatic adenocarcinoma, giving the large size of tumor in the pancreatic head, otherwise negative CT scans, I think this is metastatic adenocarcinoma from pancreas.  -We  previously discussed that his cancer is incurable at this stage, and overall prognosis is very poor. Median survival is about 4-6  months. -We previously discussed the goal of therapy is palliative, to prolong his life and to preserve his quality of life. -We discussed systemic therapeutic options, including chemotherapy and immunotherapy. We discussed prior chemotherapy regiment and the response rate. Given his limited performance status, advanced age and medical comorbidities, I recommended him to start with single agent gemcitabine. If he tolerates well, I'll add Abraxane. I do not think he is a candidate for more intensive chemotherapy, such as FOLFIRINOX -Alternatively, palliative care alone, was also previously discussed with patient. He would like to try chemotherapy first. -His chemotherapy has been held after the first week of gemcitabine, due to obstructive jaundice and hospitalization. He overall has improved, performance status still poor, we discussed continuing chemotherapy versus palliative care alone with hospice. Patient strongly wish to try more chemotherapy and chemo was resumed 2 weeks ago.  -Lab reviewed and the patient's bilirubin is elevated, he had mild low grade fever also, indicating possible infection vs cancer progression. Will hold chemo today. -due to his poor performance status, worsening liver functions, I do not feel continuing chemotherapy is beneficial.  -We discussed palliative care and hospice. The patient declined at this time.  2. Hyperbilirubinemia -Likely secondary to his underlying malignancy (pancreatic cancer and liver metastasis), s/p CBD stent placement  -Total Bilirubin 3.18 TODAY, he also had low-grade fever at home. Possible infection due to his biliary obstruction. I called in [place] today.  3. HTN -His amlodipine, was found to have orthostatic hypotension, probably related to his diarrhea. -I encouraged him to monitor his blood pressure closely at home, and hold chemotherapy if his blood pressure is low or he feels dizzy. -We'll monitor his blood pressure closely during the  chemotherapy.  4. Abdominal pain -secondary to related to underlying malignancy  -He will continue use hydrocodone as needed, his son is concerned amount of Tylenol he will take with hydrocodone, I given him prescription of tramadol as alternate pain medication. Tramadol has some interaction with Prozac, and I recommend him to take no more than twice a day.   5. Nausea, fatigue and weight loss  -Continue Zofran as needed  -I encouraged him to take nutritional supplement -We discussed appetite stimulant options, including Marinol or Megace. Potential benefits and side effects reviewed, patient and his sons opted Marinol.  5. Anxiety, depression -Continue medication. I encouraged him to be positive   6. History of alcohol abuse, active smoker -I encouraged him to quit smoking, smoking cessation was discussed with patient and his family -He is willing to cut down his smoking   7. Goal of care and code status  -I again reviewed his goal of care is palliative, giving his terminal metastatic pancreas cancer. Patient and his sons voiced good understanding and agree with it. -I recommend DO NOT RESUSCITATE and DO NOT INTUBATE, patient will think about it -I recommend patient to focus on his symptom management and quality of life, I recommended hospice, patient and his sons seem to be more receptive, but has not agreed.  8. Nutrition -We discussed using Marinol or Megace for appetite stimulation. -We discussed the patient to increase nutrition intake through shakes and water. -The patient is mildly dehydrated and we will provide IV fluids TODAY.  Plan: -Labs reviewed, hold chemo for now. -I will prescribe Marinol for the patient's appetite. -I will prescribe Levaquin. -Lab and f/u on 05/16/16  or 05/17/16. -The patient will get 1L over 2 hours of IV fluid today.   All questions were answered. The patient knows to call the clinic with any problems, questions or concerns.  I spent 30 minutes  counseling the patient face to face. The total time spent in the appointment was 40 minutes and more than 50% was on counseling.     Truitt Merle, MD 05/11/2016 9:56 PM   This document serves as a record of services personally performed by Truitt Merle, MD. It was created on her behalf by Darcus Austin, a trained medical scribe. The creation of this record is based on the scribe's personal observations and the provider's statements to them. This document has been checked and approved by the attending provider.

## 2016-05-09 LAB — CANCER ANTIGEN 19-9: CA 19-9: 15 U/mL (ref 0–35)

## 2016-05-11 ENCOUNTER — Encounter: Payer: Self-pay | Admitting: Hematology

## 2016-05-14 ENCOUNTER — Other Ambulatory Visit: Payer: Self-pay | Admitting: *Deleted

## 2016-05-14 ENCOUNTER — Telehealth: Payer: Self-pay | Admitting: *Deleted

## 2016-05-14 DIAGNOSIS — R509 Fever, unspecified: Secondary | ICD-10-CM

## 2016-05-14 NOTE — Telephone Encounter (Signed)
I called back and spoke with pt's son Merrily Pew. Pt will come in tomorrow morning to see [facility], I spoke with [place].   Truitt Merle MD

## 2016-05-14 NOTE — Telephone Encounter (Signed)
Spoke with son Reginald Ochoa, and was informed that pt was given antibiotics at last office visit on 05/08/16 due to increased bilirubin level.  Son did not think med has helped pt at all.  Reginald Ochoa wanted to know if Dr. Burr Medico could change antibiotics and/or order another scan to see if pt has infection at the stent.  Reginald Ochoa wanted to know if pt should be seen also. Stated pt is very weak, can swallow but not drinking much. Reginald Ochoa wanted to know if he should contact pt's GI provider to report increased bilirubin. Dr. Burr Medico notified. Reginald Ochoa's   Phone    [phone]   ;   Cell      [phone].

## 2016-05-14 NOTE — Telephone Encounter (Signed)
TC to [employer] with appts for tomorrow (05/15/16)for this pt. Labs at [place], NP @ 10:15.  Josh voiced understanding.

## 2016-05-15 ENCOUNTER — Inpatient Hospital Stay (HOSPITAL_COMMUNITY): Payer: Medicare PPO

## 2016-05-15 ENCOUNTER — Encounter (HOSPITAL_COMMUNITY): Payer: Self-pay

## 2016-05-15 ENCOUNTER — Inpatient Hospital Stay (HOSPITAL_COMMUNITY)
Admission: AD | Admit: 2016-05-15 | Discharge: 2016-05-18 | DRG: 919 | Disposition: A | Discharge status: Hospice | Payer: Medicare PPO | Source: Ambulatory Visit | Attending: Internal Medicine | Admitting: Internal Medicine

## 2016-05-15 ENCOUNTER — Other Ambulatory Visit (HOSPITAL_BASED_OUTPATIENT_CLINIC_OR_DEPARTMENT_OTHER): Payer: Medicare PPO

## 2016-05-15 ENCOUNTER — Encounter: Payer: Self-pay | Admitting: Nurse Practitioner

## 2016-05-15 ENCOUNTER — Ambulatory Visit (HOSPITAL_BASED_OUTPATIENT_CLINIC_OR_DEPARTMENT_OTHER): Payer: Medicare PPO | Admitting: Nurse Practitioner

## 2016-05-15 VITALS — BP 113/71 | HR 134 | Temp 99.4°F | Resp 18 | Ht 66.0 in

## 2016-05-15 DIAGNOSIS — C787 Secondary malignant neoplasm of liver and intrahepatic bile duct: Secondary | ICD-10-CM

## 2016-05-15 DIAGNOSIS — R740 Nonspecific elevation of levels of transaminase and lactic acid dehydrogenase [LDH]: Secondary | ICD-10-CM

## 2016-05-15 DIAGNOSIS — K830 Cholangitis: Secondary | ICD-10-CM | POA: Diagnosis present

## 2016-05-15 DIAGNOSIS — J45909 Unspecified asthma, uncomplicated: Secondary | ICD-10-CM | POA: Diagnosis present

## 2016-05-15 DIAGNOSIS — K831 Obstruction of bile duct: Secondary | ICD-10-CM | POA: Diagnosis present

## 2016-05-15 DIAGNOSIS — K7290 Hepatic failure, unspecified without coma: Secondary | ICD-10-CM | POA: Diagnosis present

## 2016-05-15 DIAGNOSIS — Z7951 Long term (current) use of inhaled steroids: Secondary | ICD-10-CM | POA: Diagnosis not present

## 2016-05-15 DIAGNOSIS — R17 Unspecified jaundice: Secondary | ICD-10-CM

## 2016-05-15 DIAGNOSIS — Z823 Family history of stroke: Secondary | ICD-10-CM

## 2016-05-15 DIAGNOSIS — Z8673 Personal history of transient ischemic attack (TIA), and cerebral infarction without residual deficits: Secondary | ICD-10-CM | POA: Diagnosis not present

## 2016-05-15 DIAGNOSIS — F17210 Nicotine dependence, cigarettes, uncomplicated: Secondary | ICD-10-CM | POA: Diagnosis present

## 2016-05-15 DIAGNOSIS — R509 Fever, unspecified: Secondary | ICD-10-CM

## 2016-05-15 DIAGNOSIS — K8309 Other cholangitis: Secondary | ICD-10-CM

## 2016-05-15 DIAGNOSIS — Y828 Other medical devices associated with adverse incidents: Secondary | ICD-10-CM | POA: Diagnosis present

## 2016-05-15 DIAGNOSIS — Z681 Body mass index (BMI) 19 or less, adult: Secondary | ICD-10-CM | POA: Diagnosis not present

## 2016-05-15 DIAGNOSIS — Z515 Encounter for palliative care: Secondary | ICD-10-CM | POA: Diagnosis present

## 2016-05-15 DIAGNOSIS — F1010 Alcohol abuse, uncomplicated: Secondary | ICD-10-CM | POA: Diagnosis present

## 2016-05-15 DIAGNOSIS — Z66 Do not resuscitate: Secondary | ICD-10-CM

## 2016-05-15 DIAGNOSIS — E43 Unspecified severe protein-calorie malnutrition: Secondary | ICD-10-CM | POA: Diagnosis present

## 2016-05-15 DIAGNOSIS — K838 Other specified diseases of biliary tract: Secondary | ICD-10-CM | POA: Diagnosis not present

## 2016-05-15 DIAGNOSIS — C259 Malignant neoplasm of pancreas, unspecified: Secondary | ICD-10-CM | POA: Diagnosis present

## 2016-05-15 DIAGNOSIS — F419 Anxiety disorder, unspecified: Secondary | ICD-10-CM | POA: Diagnosis present

## 2016-05-15 DIAGNOSIS — Z808 Family history of malignant neoplasm of other organs or systems: Secondary | ICD-10-CM

## 2016-05-15 DIAGNOSIS — Z7189 Other specified counseling: Secondary | ICD-10-CM | POA: Diagnosis not present

## 2016-05-15 DIAGNOSIS — K219 Gastro-esophageal reflux disease without esophagitis: Secondary | ICD-10-CM | POA: Diagnosis present

## 2016-05-15 DIAGNOSIS — R109 Unspecified abdominal pain: Secondary | ICD-10-CM | POA: Diagnosis not present

## 2016-05-15 DIAGNOSIS — R634 Abnormal weight loss: Secondary | ICD-10-CM | POA: Diagnosis present

## 2016-05-15 DIAGNOSIS — X58XXXA Exposure to other specified factors, initial encounter: Secondary | ICD-10-CM | POA: Diagnosis present

## 2016-05-15 DIAGNOSIS — Z79899 Other long term (current) drug therapy: Secondary | ICD-10-CM

## 2016-05-15 DIAGNOSIS — F329 Major depressive disorder, single episode, unspecified: Secondary | ICD-10-CM | POA: Diagnosis present

## 2016-05-15 DIAGNOSIS — T85590A Other mechanical complication of bile duct prosthesis, initial encounter: Principal | ICD-10-CM | POA: Diagnosis present

## 2016-05-15 DIAGNOSIS — Z833 Family history of diabetes mellitus: Secondary | ICD-10-CM | POA: Diagnosis not present

## 2016-05-15 DIAGNOSIS — I10 Essential (primary) hypertension: Secondary | ICD-10-CM | POA: Diagnosis present

## 2016-05-15 DIAGNOSIS — R7401 Elevation of levels of liver transaminase levels: Secondary | ICD-10-CM

## 2016-05-15 DIAGNOSIS — R748 Abnormal levels of other serum enzymes: Secondary | ICD-10-CM

## 2016-05-15 DIAGNOSIS — Z7982 Long term (current) use of aspirin: Secondary | ICD-10-CM | POA: Diagnosis not present

## 2016-05-15 LAB — URINALYSIS, MICROSCOPIC - CHCC
Blood: NEGATIVE
Glucose: NEGATIVE mg/dL
KETONES: NEGATIVE mg/dL
Protein: <30 mg/dL
SPECIFIC GRAVITY, URINE: 1.03 (ref 1.003–1.035)
pH: 6 (ref 4.6–8.0)

## 2016-05-15 LAB — CBC WITH DIFFERENTIAL/PLATELET
BASO%: 0.2 % (ref 0.0–2.0)
BASOS ABS: 0 10*3/uL (ref 0.0–0.1)
EOS%: 1 % (ref 0.0–7.0)
Eosinophils Absolute: 0.2 10*3/uL (ref 0.0–0.5)
HEMATOCRIT: 32 % — AB (ref 38.4–49.9)
HGB: 10.9 g/dL — ABNORMAL LOW (ref 13.0–17.1)
LYMPH#: 0.9 10*3/uL (ref 0.9–3.3)
LYMPH%: 5.2 % — ABNORMAL LOW (ref 14.0–49.0)
MCH: 30.5 pg (ref 27.2–33.4)
MCHC: 34.1 g/dL (ref 32.0–36.0)
MCV: 89.6 fL (ref 79.3–98.0)
MONO#: 2.4 10*3/uL — ABNORMAL HIGH (ref 0.1–0.9)
MONO%: 14 % (ref 0.0–14.0)
NEUT#: 13.8 10*3/uL — ABNORMAL HIGH (ref 1.5–6.5)
NEUT%: 79.6 % — AB (ref 39.0–75.0)
PLATELETS: 523 10*3/uL — AB (ref 140–400)
RBC: 3.57 10*6/uL — ABNORMAL LOW (ref 4.20–5.82)
RDW: 15.7 % — ABNORMAL HIGH (ref 11.0–14.6)
WBC: 17.3 10*3/uL — ABNORMAL HIGH (ref 4.0–10.3)

## 2016-05-15 LAB — CBC
HCT: 30.7 % — ABNORMAL LOW (ref 39.0–52.0)
HEMOGLOBIN: 10.5 g/dL — AB (ref 13.0–17.0)
MCH: 30.1 pg (ref 26.0–34.0)
MCHC: 34.2 g/dL (ref 30.0–36.0)
MCV: 88 fL (ref 78.0–100.0)
Platelets: 513 10*3/uL — ABNORMAL HIGH (ref 150–400)
RBC: 3.49 MIL/uL — AB (ref 4.22–5.81)
RDW: 15.6 % — ABNORMAL HIGH (ref 11.5–15.5)
WBC: 11.6 10*3/uL — ABNORMAL HIGH (ref 4.0–10.5)

## 2016-05-15 LAB — COMPREHENSIVE METABOLIC PANEL
ALT: 84 U/L — AB (ref 0–55)
ANION GAP: 13 meq/L — AB (ref 3–11)
AST: 78 U/L — ABNORMAL HIGH (ref 5–34)
Albumin: 2.9 g/dL — ABNORMAL LOW (ref 3.5–5.0)
Alkaline Phosphatase: 974 U/L — ABNORMAL HIGH (ref 40–150)
BUN: 19.6 mg/dL (ref 7.0–26.0)
CALCIUM: 9.4 mg/dL (ref 8.4–10.4)
CHLORIDE: 97 meq/L — AB (ref 98–109)
CO2: 26 mEq/L (ref 22–29)
CREATININE: 1 mg/dL (ref 0.7–1.3)
EGFR: 72 mL/min/{1.73_m2} — AB (ref >90)
Glucose: 155 mg/dl — ABNORMAL HIGH (ref 70–140)
Potassium: 3.6 mEq/L (ref 3.5–5.1)
Sodium: 136 mEq/L (ref 136–145)
Total Bilirubin: 13.8 mg/dL (ref 0.20–1.20)
Total Protein: 6.5 g/dL (ref 6.4–8.3)

## 2016-05-15 LAB — CREATININE, SERUM
Creatinine, Ser: 0.71 mg/dL (ref 0.61–1.24)
GFR calc non Af Amer: >60 mL/min (ref >60)

## 2016-05-15 MED ORDER — ENOXAPARIN SODIUM 40 MG/0.4ML ~~LOC~~ SOLN
40.0000 mg | SUBCUTANEOUS | Status: DC
  Administered 2016-05-15 – 2016-05-17 (×2): 40 mg via SUBCUTANEOUS
  Filled 2016-05-15 (×3): qty 0.4

## 2016-05-15 MED ORDER — VITAMIN B-1 100 MG PO TABS: 100.0000 mg | ORAL_TABLET | Freq: Every day | ORAL | Status: DC

## 2016-05-15 MED ORDER — ONDANSETRON 4 MG PO TBDP: 8.0000 mg | ORAL_TABLET | Freq: Three times a day (TID) | ORAL | Status: DC | PRN

## 2016-05-15 MED ORDER — HYDROCODONE-ACETAMINOPHEN 10-325 MG PO TABS
1.0000 | ORAL_TABLET | Freq: Three times a day (TID) | ORAL | Status: DC | PRN
  Administered 2016-05-16 – 2016-05-18 (×3): 1 via ORAL
  Filled 2016-05-15 (×4): qty 1

## 2016-05-15 MED ORDER — MOMETASONE FURO-FORMOTEROL FUM 200-5 MCG/ACT IN AERO
2.0000 | INHALATION_SPRAY | Freq: Two times a day (BID) | RESPIRATORY_TRACT | Status: DC
Start: 2016-05-15 — End: 2016-05-18
  Administered 2016-05-15 – 2016-05-16 (×3): 2 via RESPIRATORY_TRACT
  Filled 2016-05-15: qty 8.8

## 2016-05-15 MED ORDER — ADULT MULTIVITAMIN W/MINERALS CH
1.0000 | ORAL_TABLET | Freq: Every day | ORAL | Status: DC
Start: 2016-05-15 — End: 2016-05-18
  Administered 2016-05-15 – 2016-05-18 (×3): 1 via ORAL
  Filled 2016-05-15 (×3): qty 1

## 2016-05-15 MED ORDER — ONDANSETRON HCL 4 MG PO TABS: 4.0000 mg | ORAL_TABLET | Freq: Four times a day (QID) | ORAL | Status: DC | PRN

## 2016-05-15 MED ORDER — ALPRAZOLAM 0.5 MG PO TABS
0.5000 mg | ORAL_TABLET | Freq: Two times a day (BID) | ORAL | Status: DC | PRN
  Administered 2016-05-17: 1 mg via ORAL
  Filled 2016-05-15: qty 2

## 2016-05-15 MED ORDER — SENNOSIDES-DOCUSATE SODIUM 8.6-50 MG PO TABS: 1.0000 | ORAL_TABLET | Freq: Every evening | ORAL | Status: DC | PRN

## 2016-05-15 MED ORDER — LORAZEPAM 2 MG/ML IJ SOLN: 1.0000 mg | Freq: Four times a day (QID) | INTRAMUSCULAR | Status: DC | PRN

## 2016-05-15 MED ORDER — FLUOXETINE HCL 20 MG PO CAPS
40.0000 mg | ORAL_CAPSULE | Freq: Every day | ORAL | Status: DC
Start: 2016-05-15 — End: 2016-05-18
  Administered 2016-05-15 – 2016-05-18 (×3): 40 mg via ORAL
  Filled 2016-05-15 (×3): qty 2

## 2016-05-15 MED ORDER — SODIUM CHLORIDE 0.9% FLUSH
3.0000 mL | Freq: Two times a day (BID) | INTRAVENOUS | Status: DC
  Administered 2016-05-15 – 2016-05-16 (×2): 3 mL via INTRAVENOUS

## 2016-05-15 MED ORDER — AMPHETAMINE-DEXTROAMPHETAMINE 5 MG PO TABS: 5.0000 mg | ORAL_TABLET | Freq: Two times a day (BID) | ORAL | Status: DC

## 2016-05-15 MED ORDER — ONDANSETRON HCL 4 MG/2ML IJ SOLN
4.0000 mg | Freq: Four times a day (QID) | INTRAMUSCULAR | Status: DC | PRN
  Administered 2016-05-17: 4 mg via INTRAVENOUS

## 2016-05-15 MED ORDER — IBUPROFEN 200 MG PO TABS: 200.0000 mg | ORAL_TABLET | Freq: Four times a day (QID) | ORAL | Status: DC | PRN

## 2016-05-15 MED ORDER — SODIUM CHLORIDE 0.9% FLUSH: 3.0000 mL | INTRAVENOUS | Status: DC | PRN

## 2016-05-15 MED ORDER — POLYETHYLENE GLYCOL 3350 17 G PO PACK: 17.0000 g | PACK | Freq: Every day | ORAL | Status: DC | PRN

## 2016-05-15 MED ORDER — IOPAMIDOL (ISOVUE-300) INJECTION 61%
75.0000 mL | Freq: Once | INTRAVENOUS | Status: AC | PRN
  Administered 2016-05-15: 75 mL via INTRAVENOUS

## 2016-05-15 MED ORDER — LORAZEPAM 1 MG PO TABS
0.0000 mg | ORAL_TABLET | Freq: Four times a day (QID) | ORAL | Status: DC
  Administered 2016-05-16 (×2): 2 mg via ORAL
  Filled 2016-05-15 (×2): qty 2

## 2016-05-15 MED ORDER — AMLODIPINE BESYLATE 10 MG PO TABS
10.0000 mg | ORAL_TABLET | Freq: Every day | ORAL | Status: DC
  Administered 2016-05-16 – 2016-05-18 (×2): 10 mg via ORAL
  Filled 2016-05-15 (×2): qty 1

## 2016-05-15 MED ORDER — PANCRELIPASE (LIP-PROT-AMYL) 12000-38000 UNITS PO CPEP
12000.0000 [IU] | ORAL_CAPSULE | Freq: Three times a day (TID) | ORAL | Status: DC
  Administered 2016-05-16 – 2016-05-18 (×6): 12000 [IU] via ORAL
  Filled 2016-05-15 (×6): qty 1

## 2016-05-15 MED ORDER — THIAMINE HCL 100 MG/ML IJ SOLN: 100.0000 mg | Freq: Every day | INTRAMUSCULAR | Status: DC

## 2016-05-15 MED ORDER — FOLIC ACID 1 MG PO TABS
1.0000 mg | ORAL_TABLET | Freq: Every day | ORAL | Status: DC
  Administered 2016-05-15 – 2016-05-18 (×3): 1 mg via ORAL
  Filled 2016-05-15 (×4): qty 1

## 2016-05-15 MED ORDER — FOLIC ACID 1 MG PO TABS: 1.0000 mg | ORAL_TABLET | Freq: Every day | ORAL | Status: DC

## 2016-05-15 MED ORDER — TRAMADOL HCL 50 MG PO TABS
50.0000 mg | ORAL_TABLET | Freq: Two times a day (BID) | ORAL | Status: DC | PRN
  Administered 2016-05-17: 50 mg via ORAL
  Filled 2016-05-15: qty 1

## 2016-05-15 MED ORDER — LORAZEPAM 1 MG PO TABS: 0.0000 mg | ORAL_TABLET | Freq: Two times a day (BID) | ORAL | Status: DC

## 2016-05-15 MED ORDER — PIPERACILLIN-TAZOBACTAM 3.375 G IVPB
3.3750 g | Freq: Three times a day (TID) | INTRAVENOUS | Status: DC
  Administered 2016-05-15 – 2016-05-18 (×9): 3.375 g via INTRAVENOUS
  Filled 2016-05-15 (×10): qty 50

## 2016-05-15 MED ORDER — AMPHETAMINE-DEXTROAMPHETAMINE 10 MG PO TABS
5.0000 mg | ORAL_TABLET | Freq: Every day | ORAL | Status: DC
  Administered 2016-05-16 – 2016-05-18 (×2): 5 mg via ORAL
  Filled 2016-05-15 (×2): qty 1

## 2016-05-15 MED ORDER — VITAMIN B-1 100 MG PO TABS
100.0000 mg | ORAL_TABLET | Freq: Every day | ORAL | Status: DC
  Administered 2016-05-15 – 2016-05-18 (×3): 100 mg via ORAL
  Filled 2016-05-15 (×3): qty 1

## 2016-05-15 MED ORDER — SODIUM CHLORIDE 0.9 % IV SOLN: 250.0000 mL | INTRAVENOUS | Status: DC | PRN

## 2016-05-15 MED ORDER — LORAZEPAM 1 MG PO TABS: 1.0000 mg | ORAL_TABLET | Freq: Four times a day (QID) | ORAL | Status: DC | PRN

## 2016-05-15 MED ORDER — IOPAMIDOL (ISOVUE-300) INJECTION 61%
INTRAVENOUS | Status: AC
  Filled 2016-05-15: qty 100

## 2016-05-15 MED ORDER — AMPHETAMINE-DEXTROAMPHETAMINE 10 MG PO TABS
10.0000 mg | ORAL_TABLET | Freq: Every day | ORAL | Status: DC
  Administered 2016-05-16 – 2016-05-18 (×2): 10 mg via ORAL
  Filled 2016-05-15 (×2): qty 1

## 2016-05-15 NOTE — Assessment & Plan Note (Signed)
Patient has known liver metastasis; with liver enzymes elevated.  AST is actually slightly improved from 133, down to 78 and ALT has improved from 184 down to 84.  Patient appears significantly jaundiced today.

## 2016-05-15 NOTE — Assessment & Plan Note (Signed)
Discussion once again with both patient and his son regarding CODE STATUS.  The patient continues to refuse hospice at this point - however, he does agree with a DO NOT RESUSCITATE order at this point.  He does not wish to have any heroic efforts made to sustain his life.  Advised both patient and his son that the CODE STATUS question would most likely be asked / discussed again once he is admitted to the floor.

## 2016-05-15 NOTE — Progress Notes (Signed)
SYMPTOM MANAGEMENT CLINIC    Chief Complaint: Hyperbilirubinemia, fever  HPI:  Reginald Ochoa 76 y.o. male diagnosed with pancreatic cancer with liver metastasis.  Patient is status post chemotherapy.  Currently undergoing observation only.   Oncology History   Pancreatic cancer metastasized to liver [facility])   Staging form: Pancreas, AJCC 7th Edition   - Clinical stage from 03/01/2016: Stage IV (T2, N1, M1) - Signed by Truitt Merle, MD on 03/07/2016      Pancreatic cancer metastasized to liver [facility])   02/28/2016 - 03/01/2016 Hospital Admission    Pt presented with syncope and diarrhea. c-diff (-), he underwent liver biopsy and CT scans, and was discharged home.       03/01/2016 Initial Diagnosis    Pancreatic cancer metastasized to liver ([place])      03/01/2016 Initial Biopsy    Liver biopsy showed adenocarcinoma, features are consistent with metastatic adenocarcinoma      03/01/2016 Imaging    CT chest, abdomen and pelvis with contrast showed hyperenhancing mass in the head and uncinate process of the pancreas, with small nodular density anterior to the mass is concerning for cellulitis lesions, multiple hepatic hypodensities lesions most compatible with metastatic disease.      03/01/2016 Tumor Marker    CEA 10.5, CA19.9 2       04/11/2016 Imaging    CT ABD/PELVIS W CONTRAST 04/11/16 IMPRESSION: 1. Mild enlargement of pancreatic head/uncinate process mass and progression of hepatic metastasis. 2. Interval placement of a common duct stent. Since 02/28/2016, progression of biliary duct dilatation. Pneumobilia, arguing against complete stent obstruction. Hyperattenuation within the stent could be artifactual. Cannot exclude stones within. 3. Persistent borderline gallbladder distention without specific evidence of acute cholecystitis. 4. Progressive peripancreatic nodal metastasis. 5. Prostatomegaly with bladder wall thickening, likely secondary and related to a component of  outlet obstruction.       Review of Systems  Constitutional: Positive for fever, malaise/fatigue and weight loss.  Gastrointestinal: Positive for abdominal pain.  Skin:       Jaundiced  All other systems reviewed and are negative.   Past Medical History:  Diagnosis Date  . Alcohol abuse   . Asthma   . Cancer [facility])    pancreatic with liver lesions  . Complication of anesthesia    PMH: hard to put to sleep  . Depression   . Depression with anxiety   . GERD (gastroesophageal reflux disease)   . GIB (gastrointestinal bleeding)   . Headache   . Heartburn   . Hypertension   . Neuromuscular disorder ([place])    pinched nerve  . Pancreatic mass   . Pneumonia   . TIA (transient ischemic attack)   . Tobacco abuse     Past Surgical History:  Procedure Laterality Date  . bleeding ulcer    . CATARACT EXTRACTION    . COLONOSCOPY    . ERCP N/A 03/29/2016   Procedure: ENDOSCOPIC RETROGRADE CHOLANGIOPANCREATOGRAPHY (ERCP);  Surgeon: Clarene Essex, MD;  Location: [facility] ENDOSCOPY;  Service: Endoscopy;  Laterality: N/A;  . MULTIPLE TOOTH EXTRACTIONS    . TONSILLECTOMY      has Hypertension; Asthma exacerbation; Tobacco abuse; Depression with anxiety; Fall; Syncope; AKI (acute kidney injury) ([place]); Alcohol abuse; Tachycardia; Abnormal EKG; Pancreatic cancer metastasized to liver [facility]); Fever; Unintentional weight loss; Transaminitis; Dehydration; Bunion; RUQ abdominal pain; Elevated alkaline phosphatase level; Abdominal pain; Severe protein-calorie malnutrition ([place]); Goals of care, counseling/discussion; Hyperbilirubinemia; and DNR (do not resuscitate) on his problem list.  has No Known Allergies.  Allergies as of 05/15/2016   No Known Allergies     Medication List       Accurate as of 05/15/16  4:35 PM. Always use your most recent med list.          albuterol 108 (90 Base) MCG/ACT inhaler Commonly known as:  PROVENTIL HFA;VENTOLIN HFA Inhale 2 puffs into the lungs every 6 (six)  hours as needed for wheezing or shortness of breath.   ALPRAZolam 1 MG tablet Commonly known as:  XANAX Take 0.5-1 mg by mouth 2 (two) times daily as needed for anxiety. Anxiety   amLODipine 10 MG tablet Commonly known as:  NORVASC Take 10 mg by mouth daily.   amphetamine-dextroamphetamine 10 MG tablet Commonly known as:  ADDERALL Take 5-10 mg by mouth 2 (two) times daily with a meal. Pt takes one tablet with breakfast and one-half tablet with lunch.   aspirin EC 81 MG tablet Take 1 tablet (81 mg total) by mouth daily.   budesonide-formoterol 160-4.5 MCG/ACT inhaler Commonly known as:  SYMBICORT Inhale 2 puffs into the lungs 2 (two) times daily.   cetirizine 10 MG tablet Commonly known as:  ZYRTEC Take 10 mg by mouth at bedtime.   cholecalciferol 1000 units tablet Commonly known as:  VITAMIN D Take 1,000 Units by mouth at bedtime.   dronabinol 2.5 MG capsule Commonly known as:  MARINOL Take 1 capsule (2.5 mg total) by mouth 2 (two) times daily before a meal.   feeding supplement (PRO-STAT SUGAR FREE 64) Liqd Take 30 mLs by mouth 3 (three) times daily.   FLUoxetine 40 MG capsule Commonly known as:  PROZAC Take 40 mg by mouth daily.   HYDROcodone-acetaminophen 10-325 MG tablet Commonly known as:  NORCO Take 1 tablet by mouth every 8 (eight) hours as needed for severe pain.   ibuprofen 200 MG tablet Commonly known as:  ADVIL,MOTRIN Take 200 mg by mouth every 6 (six) hours as needed for headache, mild pain or moderate pain.   levofloxacin 500 MG tablet Commonly known as:  LEVAQUIN Take 1 tablet (500 mg total) by mouth daily.   LUTEIN PO Take 1 tablet by mouth daily.   multivitamin with minerals tablet Take 1 tablet by mouth daily.   ondansetron 8 MG disintegrating tablet Commonly known as:  ZOFRAN ODT Take 1 tablet (8 mg total) by mouth every 8 (eight) hours as needed for nausea or vomiting.   oxymetazoline 0.05 % nasal spray Commonly known as:  AFRIN Place  1 spray into both nostrils 2 (two) times daily as needed for congestion.   Pancrelipase (Lip-Prot-Amyl) 24000-76000 units Cpep Commonly known as:  CREON Take 1 capsule (24,000 Units total) by mouth 3 (three) times daily with meals.   polyethylene glycol packet Commonly known as:  MIRALAX / GLYCOLAX Take 17 g by mouth daily as needed for mild constipation.   prochlorperazine 10 MG tablet Commonly known as:  COMPAZINE Take 10 mg by mouth every 6 (six) hours as needed for nausea or vomiting.   senna-docusate 8.6-50 MG tablet Commonly known as:  Senokot-S Take 1 tablet by mouth at bedtime as needed for mild constipation.   traMADol 50 MG tablet Commonly known as:  ULTRAM Take 1 tablet (50 mg total) by mouth every 12 (twelve) hours as needed for moderate pain or severe pain.        PHYSICAL EXAMINATION  Oncology Vitals 05/15/2016 05/15/2016  Height - 168 cm  Weight - (No Data)  Weight (lbs) - (No Data)  BMI (kg/m2) - -  Temp 99.4 98.7  Pulse 134 140  Resp 18 17  SpO2 99 98  BSA (m2) - -   BP Readings from Last 2 Encounters:  05/15/16 113/71  05/08/16 123/76    Physical Exam  Constitutional: He is oriented to person, place, and time. He appears malnourished, dehydrated and jaundiced. He appears unhealthy. He appears cachectic. He has a sickly appearance.  HENT:  Head: Normocephalic and atraumatic.  Mouth/Throat: Oropharynx is clear and moist.  Eyes: Conjunctivae and EOM are normal. Pupils are equal, round, and reactive to light. Right eye exhibits no discharge. Left eye exhibits no discharge. Scleral icterus is present.  Neck: Normal range of motion. Neck supple. No JVD present. No tracheal deviation present. No thyromegaly present.  Cardiovascular: Normal rate, regular rhythm, normal heart sounds and intact distal pulses.   Pulmonary/Chest: Effort normal and breath sounds normal. No respiratory distress. He has no wheezes. He has no rales. He exhibits no tenderness.    Abdominal: Soft. Bowel sounds are normal. He exhibits no distension and no mass. There is tenderness. There is no rebound and no guarding.  Musculoskeletal: Normal range of motion. He exhibits no edema, tenderness or deformity.  Lymphadenopathy:    He has no cervical adenopathy.  Neurological: He is alert and oriented to person, place, and time.  Skin: Skin is warm and dry. No rash noted. No erythema. No pallor.  Psychiatric: Affect normal.  Nursing note and vitals reviewed.   LABORATORY DATA:. Appointment on 05/15/2016  Component Date Value Ref Range Status  . WBC 05/15/2016 17.3* 4.0 - 10.3 10e3/uL Final  . NEUT# 05/15/2016 13.8* 1.5 - 6.5 10e3/uL Final  . HGB 05/15/2016 10.9* 13.0 - 17.1 g/dL Final  . HCT 05/15/2016 32.0* 38.4 - 49.9 % Final  . Platelets 05/15/2016 523* 140 - 400 10e3/uL Final  . MCV 05/15/2016 89.6  79.3 - 98.0 fL Final  . MCH 05/15/2016 30.5  27.2 - 33.4 pg Final  . MCHC 05/15/2016 34.1  32.0 - 36.0 g/dL Final  . RBC 05/15/2016 3.57* 4.20 - 5.82 10e6/uL Final  . RDW 05/15/2016 15.7* 11.0 - 14.6 % Final  . lymph# 05/15/2016 0.9  0.9 - 3.3 10e3/uL Final  . MONO# 05/15/2016 2.4* 0.1 - 0.9 10e3/uL Final  . Eosinophils Absolute 05/15/2016 0.2  0.0 - 0.5 10e3/uL Final  . Basophils Absolute 05/15/2016 0.0  0.0 - 0.1 10e3/uL Final  . NEUT% 05/15/2016 79.6* 39.0 - 75.0 % Final  . LYMPH% 05/15/2016 5.2* 14.0 - 49.0 % Final  . MONO% 05/15/2016 14.0  0.0 - 14.0 % Final  . EOS% 05/15/2016 1.0  0.0 - 7.0 % Final  . BASO% 05/15/2016 0.2  0.0 - 2.0 % Final  . Sodium 05/15/2016 136  136 - 145 mEq/L Final  . Potassium 05/15/2016 3.6  3.5 - 5.1 mEq/L Final  . Chloride 05/15/2016 97* 98 - 109 mEq/L Final  . CO2 05/15/2016 26  22 - 29 mEq/L Final  . Glucose 05/15/2016 155* 70 - 140 mg/dl Final  . BUN 05/15/2016 19.6  7.0 - 26.0 mg/dL Final  . Creatinine 05/15/2016 1.0  0.7 - 1.3 mg/dL Final  . Total Bilirubin 05/15/2016 13.80* 0.20 - 1.20 mg/dL Final  . Alkaline Phosphatase  05/15/2016 974* 40 - 150 U/L Final  . AST 05/15/2016 78* 5 - 34 U/L Final  . ALT 05/15/2016 84* 0 - 55 U/L Final  . Total Protein 05/15/2016 6.5  6.4 - 8.3 g/dL Final  . Albumin 05/15/2016 2.9* 3.5 - 5.0 g/dL Final  . Calcium 05/15/2016 9.4  8.4 - 10.4 mg/dL Final  . Anion Gap 05/15/2016 13* 3 - 11 mEq/L Final  . EGFR 05/15/2016 72* >90 ml/min/1.73 m2 Final  . Glucose 05/15/2016 Negative  Negative mg/dL Final  . Bilirubin (Urine) 05/15/2016 Color Interference  Negative Final  . Ketones 05/15/2016 Negative  Negative mg/dL Final  . Specific Gravity, Urine 05/15/2016 1.030  1.003 - 1.035 Final  . Blood 05/15/2016 Negative  Negative Final  . pH 05/15/2016 6.0  4.6 - 8.0 Final  . Protein 05/15/2016 < 30  Negative- <30 mg/dL Final  . Urobilinogen, UR 05/15/2016 Color Interference  0.2 - 1 mg/dL Final  . Nitrite 05/15/2016 Color Interference  Negative Final  . Leukocyte Esterase 05/15/2016 Color Interference  Negative Final  . RBC / HPF 05/15/2016 0-2  0 - 2 Final  . WBC, UA 05/15/2016 0-2  0 - 2 Final  . Bacteria, UA 05/15/2016 Moderate  Negative- Trace Final  . Casts 05/15/2016 Few Hyaline and granular  Negative Final  . Epithelial Cells 05/15/2016 Few  Negative- Few Final  . Mucus, UA 05/15/2016 Small  Negative- Small Final    RADIOGRAPHIC STUDIES: No results found.  ASSESSMENT/PLAN:    Transaminitis Patient has known liver metastasis; with liver enzymes elevated.  AST is actually slightly improved from 133, down to 78 and ALT has improved from 184 down to 84.  Patient appears significantly jaundiced today.  Pancreatic cancer metastasized to liver [facility]) Patient last received chemotherapy on 04/23/2016.  Most recent restaging scan revealed progression; and patient discontinued all further chemotherapy.  Patient is currently undergoing observation only; secondary to the progression of disease, worsening weakness, and issues with his biliary  stent.  Patient presented back to the [place] today with complaint of progressive fatigue and weakness.  He's also been experiencing fevers only at night.  The highest fever last night was 101.5.  On initial check in the [place] today.  Temperature was 98.7.  Patient has been increasingly jaundiced within the past few days.  He's had minimal oral intake and feels dehydrated.  See further notes for details of today's visit.  Long discussion with both the patient and his family member regarding options for future care.  Dr. Burr Medico has recommended hospice care for the patient; the both patient and his son has made the decision that they would  prefer admission to the hospital for IV antibiotics in a recent repeat scan to determine the status of the biliary stent.  Per the chart-patient has a living will; but there are no advanced directives in the patient's chart.  At this point.  Will clarify patient's CODE STATUS with the patient and his son prior to admission to the hospital.  Decision was made for the patient to be direct admitted to the floor.  Brief history.  Report were called to Dr. Shanon Brow Tat; and he was in agreement to admit the patient.  This provider will also call report to the floor nurse; prior to patient being transported to the floor.  The wheelchair.  Per the cancer Center nurse.  Note: Patient is scheduled for labs and a follow-up visit on 05/17/2016; but this appointment will be canceled or rescheduled.  Hyperbilirubinemia Patient has been diagnosed with pancreatic cancer with liver metastasis.  He had biliary obstruction; and had a biliary stent placed per Dr. Watt Climes gastroenterologist.  Patient is RE had  one episode of biliary stent obstruction in the past; and was admitted at that time.  Patient presents back to the cancer Center today with increased fatigue and weakness.  He is much more jaundiced today as well.  His son reports the patient had a temperature last night of 101.5.  Patient continues to take Levaquin  since 05/08/2016.  When patient arrived to the cancer Center today.  His blood pressure was 113/64.  His heart rate was initially 140.  Repeat checks of heart rate with rates between 89 and 124.  Initial temperature at the cancer center was 98.7; is now 99.4.  Patient had blood cultures obtained.  He also had a urinalysis and urine culture is pending results.  It is quite possible that patient has fever secondary to cholangitis.  Long discussion with the patient and his son advising them that admitting the patient for IV antibiotics and a restaging scan of his abdomen/pelvis would possibly treat infection and give more information regarding the biliary stent-but that patient may very well be too weak to undergo any ERCP procedure needed for the biliary stent.  Dr.Feng  Has recommended Hospice care- but both pt and his son have refused at this time.      Fever Patient has been diagnosed with pancreatic cancer with liver metastasis.  He had biliary obstruction; and had a biliary stent placed per Dr. Ewing Schlein gastroenterologist.  Patient is RE had one episode of biliary stent obstruction in the past; and was admitted at that time.  Patient presents back to the cancer Center today with increased fatigue and weakness.  He is much more jaundiced today as well.  His son reports the patient had a temperature last night of 101.5.  Patient continues to take Levaquin since 05/08/2016.  When patient arrived to the cancer Center today.  His blood pressure was 113/64.  His heart rate was initially 140.  Repeat checks of heart rate with rates between 89 and 124.  Initial temperature at the cancer center was 98.7; is now 99.4.  Patient had blood cultures obtained.  He also had a urinalysis and urine culture is pending results.  It is quite possible that patient has fever secondary to cholangitis.  Long discussion with the patient and his son advising them that admitting the patient for IV antibiotics and a  restaging scan of his abdomen/pelvis would possibly treat infection and give more information regarding the biliary stent-but that patient may very well be too weak to undergo any ERCP procedure needed for the biliary stent.  Dr.Feng  Has recommended Hospice care- but both pt and his son have refused at this time.    Elevated alkaline phosphatase level Alk phos remains elevated at 974.    DNR (do not resuscitate) Discussion once again with both patient and his son regarding CODE STATUS.  The patient continues to refuse hospice at this point - however, he does agree with a DO NOT RESUSCITATE order at this point.  He does not wish to have any heroic efforts made to sustain his life.  Advised both patient and his son that the CODE STATUS question would most likely be asked / discussed again once he is admitted to the floor.    Patient stated understanding of all instructions; and was in agreement with this plan of care. The patient knows to call the clinic with any problems, questions or concerns.   Total time spent with patient was 40 minutes;  with greater than 75 percent of  that time spent in face to face counseling regarding patient's symptoms,  and coordination of care and follow up.  Disclaimer:This dictation was prepared with Dragon/digital dictation along with [employer]. Any transcriptional errors that result from this process are unintentional.  Drue Second, NP 05/15/2016

## 2016-05-15 NOTE — Progress Notes (Signed)
Pharmacy Antibiotic Note  Reginald Ochoa is a 76 y.o. male admitted on 05/15/2016 with intra-abdominal infection.  He has hx of Prostate CA.  Patient continues to worsen despite being on Levaquin (started 12/27). Pharmacy has been consulted for Zosyn dosing. CrCl>7ml/min.   Plan: Zosyn 3.375g IV q8h (4 hour infusion).  No dose adjustments anticipated.  Pharmacy will s/o.  Please re-consult if needed.    Height: 5\' 6"  (167.6 cm) Weight: 116 lb 2.9 oz (52.7 kg) IBW/kg (Calculated) : 63.8  Temp (24hrs), Avg:99.1 F (37.3 C), Min:98.7 F (37.1 C), Max:99.4 F (37.4 C)   Recent Labs Lab 05/15/16 1103 05/15/16 1103  WBC 17.3*  --   CREATININE  --  1.0    Estimated Creatinine Clearance: 48.3 mL/min (by C-G formula based on SCr of 1 mg/dL).    No Known Allergies  Antimicrobials this admission: 1/3 Zosyn  >>   Dose adjustments this admission:  Microbiology results: 1/3 BCx: ordered  Thank you for allowing pharmacy to be a part of this patient's care.  Biagio Borg 05/15/2016 6:37 PM

## 2016-05-15 NOTE — Assessment & Plan Note (Signed)
Patient has been diagnosed with pancreatic cancer with liver metastasis.  He had biliary obstruction; and had a biliary stent placed per Dr. Watt Climes gastroenterologist.  Patient is RE had one episode of biliary stent obstruction in the past; and was admitted at that time.  Patient presents back to the [place] today with increased fatigue and weakness.  He is much more jaundiced today as well.  His son reports the patient had a temperature last night of 101.5.  Patient continues to take Levaquin since 05/08/2016.  When patient arrived to the [place] today.  His blood pressure was 113/64.  His heart rate was initially 140.  Repeat checks of heart rate with rates between 89 and 124.  Initial temperature at the cancer center was 98.7; is now 99.4.  Patient had blood cultures obtained.  He also had a urinalysis and urine culture is pending results.  It is quite possible that patient has fever secondary to cholangitis.  Long discussion with the patient and his son advising them that admitting the patient for IV antibiotics and a restaging scan of his abdomen/pelvis would possibly treat infection and give more information regarding the biliary stent-but that patient may very well be too weak to undergo any ERCP procedure needed for the biliary stent.  Dr.Feng  Has recommended Hospice care- but both pt and his son have refused at this time.

## 2016-05-15 NOTE — Assessment & Plan Note (Signed)
Patient has been diagnosed with pancreatic cancer with liver metastasis.  He had biliary obstruction; and had a biliary stent placed per Dr. Watt Climes gastroenterologist.  Patient is RE had one episode of biliary stent obstruction in the past; and was admitted at that time.  Patient presents back to the [place] today with increased fatigue and weakness.  He is much more jaundiced today as well.  His son reports the patient had a temperature last night of 101.5.  Patient continues to take Levaquin since 05/08/2016.  When patient arrived to the [place] today.  His blood pressure was 113/64.  His heart rate was initially 140.  Repeat checks of heart rate with rates between 89 and 124.  Initial temperature at the cancer center was 98.7; is now 99.4.  Patient had blood cultures obtained.  He also had a urinalysis and urine culture is pending results.  It is quite possible that patient has fever secondary to cholangitis.  Long discussion with the patient and his son advising them that admitting the patient for IV antibiotics and a restaging scan of his abdomen/pelvis would possibly treat infection and give more information regarding the biliary stent-but that patient may very well be too weak to undergo any ERCP procedure needed for the biliary stent.  Dr.Feng  Has recommended Hospice care- but both pt and his son have refused at this time.

## 2016-05-15 NOTE — H&P (Addendum)
History and Physical    Reginald Ochoa [id number] DOB: 1940/10/25 DOA: 05/15/2016  PCP: Elyn Peers, MD  Patient coming from: home  Chief Complaint: fever  HPI: Reginald Ochoa is a 75 y.o. male with medical history significant of stage IV pancreatic cancer with last chemotherapy on 04/23/2016 not a candidate for chemotherapy due to deconditioning, most recent imaging revealed progression of disease, essential hypertension tobacco and alcohol abuse status post biliary stent in October 2017 the comes in to the [facility] with complaints of progressive fatigue weakness and fever that started on the day prior to admission despite being on oral Levaquin, he continues to have weight loss, and has progressively gotten jaundice over the past few days with anorexia. Had a long discussion with Dr. Annamaria Boots who recommended hospice and the patient agreed. The patient will like to be treated with antibiotics and see of there is a possible fixable cause for his symptoms.  ED Course:   Review of Systems: As per HPI otherwise 10 point review of systems negative.    Past Medical History:  Diagnosis Date  . Alcohol abuse   . Asthma   . Cancer [facility])    pancreatic with liver lesions  . Complication of anesthesia    PMH: hard to put to sleep  . Depression   . Depression with anxiety   . GERD (gastroesophageal reflux disease)   . GIB (gastrointestinal bleeding)   . Headache   . Heartburn   . Hypertension   . Neuromuscular disorder ([place])    pinched nerve  . Pancreatic mass   . Pneumonia   . TIA (transient ischemic attack)   . Tobacco abuse     Past Surgical History:  Procedure Laterality Date  . bleeding ulcer    . CATARACT EXTRACTION    . COLONOSCOPY    . ERCP N/A 03/29/2016   Procedure: ENDOSCOPIC RETROGRADE CHOLANGIOPANCREATOGRAPHY (ERCP);  Surgeon: Clarene Essex, MD;  Location: [facility] ENDOSCOPY;  Service: Endoscopy;  Laterality: N/A;  . MULTIPLE TOOTH EXTRACTIONS    . TONSILLECTOMY         reports that he has been smoking Cigarettes and Cigars.  He has a 20.00 pack-year smoking history. He has never used smokeless tobacco. He reports that he drinks alcohol. He reports that he does not use drugs.  No Known Allergies  Family History  Problem Relation Age of Onset  . Stroke Mother   . Cancer Maternal Uncle     bvrain tumor   . Cancer Cousin     brain tumor   . Diabetes Father     Prior to Admission medications   Medication Sig Start Date End Date Taking? Authorizing Provider  albuterol (PROVENTIL HFA;VENTOLIN HFA) 108 (90 BASE) MCG/ACT inhaler Inhale 2 puffs into the lungs every 6 (six) hours as needed for wheezing or shortness of breath.    Historical Provider, MD  ALPRAZolam Duanne Moron) 1 MG tablet Take 0.5-1 mg by mouth 2 (two) times daily as needed for anxiety. Anxiety     Historical Provider, MD  Amino Acids-Protein Hydrolys (FEEDING SUPPLEMENT, PRO-STAT SUGAR FREE 64,) LIQD Take 30 mLs by mouth 3 (three) times daily. Patient not taking: Reported on 05/08/2016 04/13/16   Barton Dubois, MD  amLODipine (NORVASC) 10 MG tablet Take 10 mg by mouth daily.    Historical Provider, MD  amphetamine-dextroamphetamine (ADDERALL) 10 MG tablet Take 5-10 mg by mouth 2 (two) times daily with a meal. Pt takes one tablet with breakfast and one-half tablet with  lunch.    Historical Provider, MD  aspirin EC 81 MG tablet Take 1 tablet (81 mg total) by mouth daily. 04/13/16   Barton Dubois, MD  budesonide-formoterol [facility]) 160-4.5 MCG/ACT inhaler Inhale 2 puffs into the lungs 2 (two) times daily.    Historical Provider, MD  cetirizine (ZYRTEC) 10 MG tablet Take 10 mg by mouth at bedtime.    Historical Provider, MD  cholecalciferol (VITAMIN D) 1000 units tablet Take 1,000 Units by mouth at bedtime.     Historical Provider, MD  dronabinol (MARINOL) 2.5 MG capsule Take 1 capsule (2.5 mg total) by mouth 2 (two) times daily before a meal. 05/08/16   Truitt Merle, MD  FLUoxetine (PROZAC) 40 MG  capsule Take 40 mg by mouth daily.     Historical Provider, MD  HYDROcodone-acetaminophen (NORCO) 10-325 MG tablet Take 1 tablet by mouth every 8 (eight) hours as needed for severe pain. 04/13/16   Barton Dubois, MD  ibuprofen (ADVIL,MOTRIN) 200 MG tablet Take 200 mg by mouth every 6 (six) hours as needed for headache, mild pain or moderate pain.     Historical Provider, MD  levofloxacin (LEVAQUIN) 500 MG tablet Take 1 tablet (500 mg total) by mouth daily. 05/08/16   Truitt Merle, MD  LUTEIN PO Take 1 tablet by mouth daily.    Historical Provider, MD  Multiple Vitamins-Minerals (MULTIVITAMIN WITH MINERALS) tablet Take 1 tablet by mouth daily.     Historical Provider, MD  ondansetron (ZOFRAN ODT) 8 MG disintegrating tablet Take 1 tablet (8 mg total) by mouth every 8 (eight) hours as needed for nausea or vomiting. 03/22/16   Truitt Merle, MD  oxymetazoline (AFRIN) 0.05 % nasal spray Place 1 spray into both nostrils 2 (two) times daily as needed for congestion.    Historical Provider, MD  Pancrelipase, Lip-Prot-Amyl, (CREON) 24000-76000 units CPEP Take 1 capsule (24,000 Units total) by mouth 3 (three) times daily with meals. 04/29/16   Truitt Merle, MD  polyethylene glycol [facility] Floria Raveling) packet Take 17 g by mouth daily as needed for mild constipation. 04/13/16   Barton Dubois, MD  prochlorperazine (COMPAZINE) 10 MG tablet Take 10 mg by mouth every 6 (six) hours as needed for nausea or vomiting.    Historical Provider, MD  senna-docusate (SENOKOT-S) 8.6-50 MG tablet Take 1 tablet by mouth at bedtime as needed for mild constipation. Patient not taking: Reported on 05/08/2016 03/07/16   Lavina Hamman, MD  traMADol (ULTRAM) 50 MG tablet Take 1 tablet (50 mg total) by mouth every 12 (twelve) hours as needed for moderate pain or severe pain. 04/17/16   Truitt Merle, MD    Physical Exam: Vitals:   05/15/16 1717  BP: 113/83  Pulse: (!) 129  Resp: 18  Temp: 99.1 F (37.3 C)  TempSrc: Oral  SpO2: 99%       Constitutional: NAD, calm, comfortable Vitals:   05/15/16 1717  BP: 113/83  Pulse: (!) 129  Resp: 18  Temp: 99.1 F (37.3 C)  TempSrc: Oral  SpO2: 99%   Eyes: PERRL, lids and conjunctivae normal ENMT: Mucous membranes are moist. Posterior pharynx clear of any exudate or lesions.Normal dentition.  Neck: normal, supple, no masses, no thyromegaly Respiratory: clear to auscultation bilaterally, no wheezing, no crackles. Normal respiratory effort. No accessory muscle use.  Cardiovascular: Regular rate and rhythm, no murmurs / rubs / gallops. No extremity edema. 2+ pedal pulses. No carotid bruits.  Abdomen: no tenderness, no masses palpated. No hepatosplenomegaly. Bowel sounds positive.  Musculoskeletal:  no clubbing / cyanosis. No joint deformity upper and lower extremities. Good ROM, no contractures. Normal muscle tone.  Skin: no rashes, lesions, ulcers. No induration Neurologic: CN 2-12 grossly intact. Sensation intact, DTR normal. Strength 5/5 in all 4.  Psychiatric: Normal judgment and insight. Alert and oriented x 3. Normal mood.   Labs on Admission: I have personally reviewed following labs and imaging studies  CBC:  Recent Labs Lab 05/15/16 1103  WBC 17.3*  NEUTROABS 13.8*  HGB 10.9*  HCT 32.0*  MCV 89.6  PLT 0000000*   Basic Metabolic Panel:  Recent Labs Lab 05/15/16 1103  NA 136  K 3.6  CO2 26  GLUCOSE 155*  BUN 19.6  CREATININE 1.0  CALCIUM 9.4   GFR: Estimated Creatinine Clearance: 48.3 mL/min (by C-G formula based on SCr of 1 mg/dL). Liver Function Tests:  Recent Labs Lab 05/15/16 1103  AST 78*  ALT 84*  ALKPHOS 974*  BILITOT 13.80*  PROT 6.5  ALBUMIN 2.9*   No results for input(s): LIPASE, AMYLASE in the last 168 hours. No results for input(s): AMMONIA in the last 168 hours. Coagulation Profile: No results for input(s): INR, PROTIME in the last 168 hours. Cardiac Enzymes: No results for input(s): CKTOTAL, CKMB, CKMBINDEX, TROPONINI in  the last 168 hours. BNP (last 3 results) No results for input(s): PROBNP in the last 8760 hours. HbA1C: No results for input(s): HGBA1C in the last 72 hours. CBG: No results for input(s): GLUCAP in the last 168 hours. Lipid Profile: No results for input(s): CHOL, HDL, LDLCALC, TRIG, CHOLHDL, LDLDIRECT in the last 72 hours. Thyroid Function Tests: No results for input(s): TSH, T4TOTAL, FREET4, T3FREE, THYROIDAB in the last 72 hours. Anemia Panel: No results for input(s): VITAMINB12, FOLATE, FERRITIN, TIBC, IRON, RETICCTPCT in the last 72 hours. Urine analysis:    Component Value Date/Time   COLORURINE AMBER (A) 04/30/2016 1730   APPEARANCEUR HAZY (A) 04/30/2016 1730   LABSPEC 1.030 05/15/2016 1104   PHURINE 6.0 05/15/2016 1104   PHURINE 5.0 04/30/2016 1730   GLUCOSEU Negative 05/15/2016 1104   HGBUR Negative 05/15/2016 1104   HGBUR NEGATIVE 04/30/2016 1730   BILIRUBINUR Color Interference 05/15/2016 1104   KETONESUR Negative 05/15/2016 1104   KETONESUR NEGATIVE 04/30/2016 1730   PROTEINUR < 30 05/15/2016 1104   PROTEINUR NEGATIVE 04/30/2016 1730   UROBILINOGEN Color Interference 05/15/2016 1104   NITRITE Color Interference 05/15/2016 1104   NITRITE NEGATIVE 04/30/2016 1730   LEUKOCYTESUR Color Interference 05/15/2016 1104   Sepsis Labs: !!!!!!!!!!!!!!!!!!!!!!!!!!!!!!!!!!!!!!!!!!!! @LABRCNTIP (procalcitonin:4,lacticidven:4) )No results found for this or any previous visit (from the past 240 hour(s)).   Radiological Exams on Admission: No results found.  EKG: Independently reviewed. none  Assessment/Plan Obstructive Jaundice/  Fever/  Pancreatic cancer metastasized to liver [facility]): Historical and phosphatase is 900 his bilirubin is 13 up from 3 and 05/08/2016. I will admit to [facility], get a CT scan with contrast of the abdomen and pelvis to reevaluate his stent, make sure it has not collapse. I will go ahead and get blood cultures 2, start him empirically on IV Zosyn for  fevers. As in the differential cholangitis is included. Fevers could also be due to progression of malignancy. He is not a candidate for chemotherapy and is deceased seems to be progressing, he is a DO NOT RESUSCITATE. I will consult palliative care to have further discussions about end of life.  Alcohol abuse He still is drinking monitor with CIWA. Start thiamine and folate.  Unintentional weight loss: Due to pancreatic  cancer.  Severe protein-calorie malnutrition ([place])  DNR (do not resuscitate)      DVT prophylaxis: lovenox Code Status: DNR Family Communication: Daughter Disposition Plan: Once he is afebrile and on oral antibiotics and determination for comfort care has been established he probably go home. Consults called: PMT Admission status: inpatient   Charlynne Cousins MD [facility] Pager [phone]  If 7PM-7AM, please contact night-coverage [email or web address] Password [facility]  05/15/2016, 5:53 PM

## 2016-05-15 NOTE — Assessment & Plan Note (Signed)
Patient last received chemotherapy on 04/23/2016.  Most recent restaging scan revealed progression; and patient discontinued all further chemotherapy.  Patient is currently undergoing observation only; secondary to the progression of disease, worsening weakness, and issues with his biliary  stent.  Patient presented back to the [facility] today with complaint of progressive fatigue and weakness.  He's also been experiencing fevers only at night.  The highest fever last night was 101.5.  On initial check in the [facility] today.  Temperature was 98.7.  Patient has been increasingly jaundiced within the past few days.  He's had minimal oral intake and feels dehydrated.  See further notes for details of today's visit.  Long discussion with both the patient and his family member regarding options for future care.  Dr. Burr Medico has recommended hospice care for the patient; the both patient and his son has made the decision that they would  prefer admission to the hospital for IV antibiotics in a recent repeat scan to determine the status of the biliary stent.  Per the chart-patient has a living will; but there are no advanced directives in the patient's chart.  At this point.  Will clarify patient's CODE STATUS with the patient and his son prior to admission to the hospital.  Decision was made for the patient to be direct admitted to the floor.  Brief history.  Report were called to Dr. Shanon Brow Tat; and he was in agreement to admit the patient.  This provider will also call report to the floor nurse; prior to patient being transported to the floor.  The wheelchair.  Per the cancer Center nurse.  Note: Patient is scheduled for labs and a follow-up visit on 05/17/2016; but this appointment will be canceled or rescheduled.

## 2016-05-15 NOTE — Assessment & Plan Note (Signed)
Alk phos remains elevated at 974.

## 2016-05-16 DIAGNOSIS — Z515 Encounter for palliative care: Secondary | ICD-10-CM

## 2016-05-16 DIAGNOSIS — C787 Secondary malignant neoplasm of liver and intrahepatic bile duct: Secondary | ICD-10-CM

## 2016-05-16 DIAGNOSIS — R17 Unspecified jaundice: Secondary | ICD-10-CM

## 2016-05-16 DIAGNOSIS — Z7189 Other specified counseling: Secondary | ICD-10-CM

## 2016-05-16 DIAGNOSIS — R109 Unspecified abdominal pain: Secondary | ICD-10-CM

## 2016-05-16 DIAGNOSIS — K830 Cholangitis: Secondary | ICD-10-CM

## 2016-05-16 DIAGNOSIS — K838 Other specified diseases of biliary tract: Secondary | ICD-10-CM

## 2016-05-16 DIAGNOSIS — K8309 Other cholangitis: Secondary | ICD-10-CM

## 2016-05-16 DIAGNOSIS — C259 Malignant neoplasm of pancreas, unspecified: Secondary | ICD-10-CM

## 2016-05-16 LAB — COMPREHENSIVE METABOLIC PANEL
ALBUMIN: 2.7 g/dL — AB (ref 3.5–5.0)
ALT: 61 U/L (ref 17–63)
AST: 65 U/L — AB (ref 15–41)
Alkaline Phosphatase: 616 U/L — ABNORMAL HIGH (ref 38–126)
Anion gap: 11 (ref 5–15)
BUN: 23 mg/dL — AB (ref 6–20)
CHLORIDE: 97 mmol/L — AB (ref 101–111)
CO2: 25 mmol/L (ref 22–32)
CREATININE: 0.7 mg/dL (ref 0.61–1.24)
Calcium: 8.3 mg/dL — ABNORMAL LOW (ref 8.9–10.3)
GFR calc Af Amer: >60 mL/min (ref >60)
Glucose, Bld: 170 mg/dL — ABNORMAL HIGH (ref 65–99)
POTASSIUM: 3.2 mmol/L — AB (ref 3.5–5.1)
SODIUM: 133 mmol/L — AB (ref 135–145)
Total Bilirubin: 12.7 mg/dL — ABNORMAL HIGH (ref 0.3–1.2)
Total Protein: 5.5 g/dL — ABNORMAL LOW (ref 6.5–8.1)

## 2016-05-16 MED ORDER — SODIUM CHLORIDE 0.9 % IV SOLN: INTRAVENOUS | Status: DC

## 2016-05-16 MED ORDER — UNJURY CHICKEN SOUP POWDER
8.0000 [oz_av] | Freq: Three times a day (TID) | ORAL | Status: DC
  Administered 2016-05-16 – 2016-05-18 (×3): 8 [oz_av] via ORAL
  Filled 2016-05-16 (×7): qty 27

## 2016-05-16 MED ORDER — POTASSIUM CHLORIDE CRYS ER 20 MEQ PO TBCR
40.0000 meq | EXTENDED_RELEASE_TABLET | Freq: Once | ORAL | Status: AC
  Administered 2016-05-16: 40 meq via ORAL
  Filled 2016-05-16: qty 2

## 2016-05-16 NOTE — Progress Notes (Signed)
Reginald Ochoa   DOB:1941/04/11   [id number]   [id number]  ONCOLOGY FOLLOW UP   Subjective: Pt was admitted from our clinic yesterday for fever and worsening jaundice. He was seen by GI Dr. Watt Climes today, plan for ERCP and stent exchange tomorrow.  Objective:  Vitals:   05/16/16 1444 05/16/16 2057  BP: 115/77 100/66  Pulse: 78 88  Resp: 18 18  Temp: 98.6 F (37 C) 98.5 F (36.9 C)    Body mass index is 19.14 kg/m.  Intake/Output Summary (Last 24 hours) at 05/16/16 2309 Last data filed at 05/16/16 0500  Gross per 24 hour  Intake              100 ml  Output                0 ml  Net              100 ml     Sclerae jaundice   Oropharynx clear  No peripheral adenopathy  Lungs clear -- no rales or rhonchi  Heart regular rate and rhythm  Abdomen mild tenderness   MSK no focal spinal tenderness, no peripheral edema  Neuro nonfocal    CBG (last 3)  No results for input(s): GLUCAP in the last 72 hours.   Labs:  Lab Results  Component Value Date   WBC 11.6 (H) 05/15/2016   HGB 10.5 (L) 05/15/2016   HCT 30.7 (L) 05/15/2016   MCV 88.0 05/15/2016   PLT 513 (H) 05/15/2016   NEUTROABS 13.8 (H) 05/15/2016   CMP Latest Ref Rng & Units 05/16/2016 05/15/2016 05/15/2016  Glucose 65 - 99 mg/dL 170(H) 155(H) -  BUN 6 - 20 mg/dL 23(H) 19.6 -  Creatinine 0.61 - 1.24 mg/dL 0.70 0.71 1.0  Sodium 135 - 145 mmol/L 133(L) 136 -  Potassium 3.5 - 5.1 mmol/L 3.2(L) 3.6 -  Chloride 101 - 111 mmol/L 97(L) - -  CO2 22 - 32 mmol/L 25 26 -  Calcium 8.9 - 10.3 mg/dL 8.3(L) 9.4 -  Total Protein 6.5 - 8.1 g/dL 5.5(L) 6.5 -  Total Bilirubin 0.3 - 1.2 mg/dL 12.7(H) 13.80(HH) -  Alkaline Phos 38 - 126 U/L 616(H) 974(H) -  AST 15 - 41 U/L 65(H) 78(H) -  ALT 17 - 63 U/L 61 84(H) -     Urine Studies No results for input(s): UHGB, CRYS in the last 72 hours.  Invalid input(s): UACOL, UAPR, USPG, UPH, UTP, UGL, UKET, UBIL, UNIT, UROB, ULEU, UEPI, UWBC, URBC, UBAC, CAST, UCOM, [place]  Basic  Metabolic Panel:  Recent Labs Lab 05/15/16 1103 05/15/16 1904 05/16/16 0615  NA 136  --  133*  K 3.6  --  3.2*  CL  --   --  97*  CO2 26  --  25  GLUCOSE 155*  --  170*  BUN 19.6  --  23*  CREATININE 1.0 0.71 0.70  CALCIUM 9.4  --  8.3*   GFR Estimated Creatinine Clearance: 61.6 mL/min (by C-G formula based on SCr of 0.7 mg/dL). Liver Function Tests:  Recent Labs Lab 05/15/16 1103 05/16/16 0615  AST 78* 65*  ALT 84* 61  ALKPHOS 974* 616*  BILITOT 13.80* 12.7*  PROT 6.5 5.5*  ALBUMIN 2.9* 2.7*   No results for input(s): LIPASE, AMYLASE in the last 168 hours. No results for input(s): AMMONIA in the last 168 hours. Coagulation profile No results for input(s): INR, PROTIME in the last 168 hours.  CBC:  Recent Labs  Lab 05/15/16 1103 05/15/16 1904  WBC 17.3* 11.6*  NEUTROABS 13.8*  --   HGB 10.9* 10.5*  HCT 32.0* 30.7*  MCV 89.6 88.0  PLT 523* 513*   Cardiac Enzymes: No results for input(s): CKTOTAL, CKMB, CKMBINDEX, TROPONINI in the last 168 hours. BNP: Invalid input(s): POCBNP CBG: No results for input(s): GLUCAP in the last 168 hours. D-Dimer No results for input(s): DDIMER in the last 72 hours. Hgb A1c No results for input(s): HGBA1C in the last 72 hours. Lipid Profile No results for input(s): CHOL, HDL, LDLCALC, TRIG, CHOLHDL, LDLDIRECT in the last 72 hours. Thyroid function studies No results for input(s): TSH, T4TOTAL, T3FREE, THYROIDAB in the last 72 hours.  Invalid input(s): FREET3 Anemia work up No results for input(s): VITAMINB12, FOLATE, FERRITIN, TIBC, IRON, RETICCTPCT in the last 72 hours. Microbiology Recent Results (from the past 240 hour(s))  Culture, Blood     Status: None (Preliminary result)   Collection Time: 05/15/16 11:27 AM  Result Value Ref Range Status   BLOOD CULTURE, ROUTINE Preliminary report  Preliminary   RESULT 1 Comment  Preliminary    Comment: No growth detected at this time.  Culture, blood (single) w Reflex to ID  Panel     Status: None (Preliminary result)   Collection Time: 05/15/16 11:32 AM  Result Value Ref Range Status   BLOOD CULTURE, ROUTINE Preliminary report  Preliminary   RESULT 1 Comment  Preliminary    Comment: No growth detected at this time.  Culture, blood (Routine X 2) w Reflex to ID Panel     Status: None (Preliminary result)   Collection Time: 05/15/16  7:04 PM  Result Value Ref Range Status   Specimen Description BLOOD LEFT ARM  Final   Special Requests BOTTLES DRAWN AEROBIC AND ANAEROBIC 5CC  Final   Culture   Final    NO GROWTH < 24 HOURS Performed at [facility]    Report Status PENDING  Incomplete  Culture, blood (Routine X 2) w Reflex to ID Panel     Status: None (Preliminary result)   Collection Time: 05/15/16  7:04 PM  Result Value Ref Range Status   Specimen Description BLOOD RIGHT WRIST  Final   Special Requests BOTTLES DRAWN AEROBIC AND ANAEROBIC 5CC  Final   Culture   Final    NO GROWTH < 24 HOURS Performed at [facility]    Report Status PENDING  Incomplete      Studies:  Ct Abdomen Pelvis W Contrast  Result Date: 05/15/2016 CLINICAL DATA:  Stage IV pancreas cancer, status post biliary stent in October. Progressive fatigue weakness and fever EXAM: CT ABDOMEN AND PELVIS WITH CONTRAST TECHNIQUE: Multidetector CT imaging of the abdomen and pelvis was performed using the standard protocol following bolus administration of intravenous contrast. CONTRAST:  64mL ISOVUE-300 IOPAMIDOL (ISOVUE-300) INJECTION 61% COMPARISON:  04/11/2016 FINDINGS: Lower chest: Lung bases demonstrate minimal fibrosis within the medial right lung base. No acute infiltrate or effusion is seen. The heart is not enlarged. Hepatobiliary: Innumerable hypodense hepatic metastatic lesions are visualized. Interval progression in number and size of multiple lesions. For example index lesion in the right hepatic dome, series 2, image number 9 measures 4.1 cm compared with 3.1 cm  previously. A lateral segment left hepatic lobe lesion measures 2.9 cm compared with 1.8 cm previously. There is also interval increase in intra and extrahepatic biliary dilatation. Extrahepatic bile duct measures 2.6 cm at the proximal aspect of the stent. Stent is similar in  position. Hypo to hyperdense material or tissue is present within the stent. Small amount of pneumobilia, likely related to the stent, but decreased compared to the prior study. Gallbladder is enlarged and demonstrates mild wall thickening or edema. Pancreas: Atrophic distal body and tail of pancreas with enlarged pancreatic duct. Ill-defined mass at the head and uncinate process measures 4.8 x 3.8 cm compared with 3.8 x 3.7 cm previously. Spleen: Normal in size without focal abnormality. Adrenals/Urinary Tract: Adrenal glands show no mass. Scattered cysts in the right kidney. No hydronephrosis. Thick-walled appearance of the posterior bladder as before. Stomach/Bowel: Stomach nonenlarged. No dilated small bowel. Large amount of stool in the colon. No colon wall thickening. Mild to moderate fecal impaction in the rectum. Vascular/Lymphatic: Atherosclerotic vascular calcifications. The cystic nodule adjacent to the dominant pancreatic mass measures 1.6 cm, compared with 1.4 cm previously. Reproductive: Prostate gland is moderately enlarged. There is mass effect on the posterior bladder. Probable penile calcification as before. Other: No free air or free fluid. Musculoskeletal: No acute or suspicious bone lesions. IMPRESSION: 1. Innumerable hepatic metastatic lesions, increased in size and number compared to the previous exam, consistent with progression of metastatic disease. 2. Interval enlargement of the pancreatic head/uncinate process mass. 3. Interval worsening of intra and extra hepatic biliary dilatation. Minimal pneumobilia is present, this is decreased compared to the prior exam. Hypo and hyperdense material is present within the  stent. Given increased biliary dilatation and decreased pneumobilia, findings could relate to extend obstruction. 4. Dilated gallbladder with mild wall thickening or edema 5. Progression of necrotic peripancreatic nodal metastasis. Electronically Signed   By: Donavan Foil M.D.   On: 05/15/2016 23:02    Assessment: 76 y.o.  1. Abdominal pain, likely secondary to cancer progression, possible cholangitis due to biliary obstruction 2. Obstructive jaundice, worsening  3. Pancreatic cancer with liver metastasis  4. Hypertension  5. History of TIA 6. Depression and anxiety  7. Deconditioning secondary to #3 8. Severe protein and calorie malnutrition   Plan -appreciate GI input, plan to attempt ERCP with stent exchange tomorrow -on antibiotics, cultures are pending  -family has finally agreed with hospice after tomorrow's proceed, I spoke with his wife. I think hospice is most appropriate, he is not a candidate for further chemotherapy due to her poor performance status, and liver failure. His overall prognosis is extremely poor  -I will be happy to remain as his M.D. where he is under hospice care.   Truitt Merle, MD 05/16/2016

## 2016-05-16 NOTE — Progress Notes (Addendum)
PROGRESS NOTE  Reginald Ochoa [id number] DOB: 08/21/1940 DOA: 05/15/2016 PCP: Elyn Peers, MD  Brief Narrative: 76 year old man PMH stage IV pancreatic cancer, no longer a candidate for chemotherapy, hospice recommended the patient has refused thus far, status post biliary stent October 2017, presented to the [facility] for symptom management with complaint of progressive fatigue, weakness, fever, weight loss and jaundice. Admitted for obstructive jaundice, fever, pancreatic cancer with metastatic disease to liver. CT scan ordered and started on antibiotics for possible cholangitis.  Assessment/Plan 1. Pancreatic cancer with metastatic disease to the liver, biliary obstruction status post ERCP with biliary stent placement 11/17 by Dr. Watt Climes with increased abd pain and fever.  Admitted for possible cholangitis, started on antibiotics. CT suggests the possibility of stent occlusion. Discussed with patient and son-in-law at bedside. They're both interested in intervention if recommended by GI.  Total bilirubin has gone from 3.18 on December 27 to 12.7 on January 4.  Per chart Dr. Burr Medico has recommended Hospice. Patient opened of this on discharge. Palliative medicine this been consult. 2. Fever in the context of above, possible cholangitis.  Now afebrile. Continue empiric antibiotics. 3. Alcohol abuse. Son reports that this is in remission for many years now. No history of recent alcohol intake.  Monitor clinically. Stop Ativan. 4. Severe protein calorie malnutrition  Supportive care.   Afebrile currently, vitals stable. Total bilirubin somewhat improved compared to yesterday. Remainder of LFTs elevated consistent with known underlying diagnosis. GI consultation place, follow-up recommendations. Follow-up culture data. Likely narrowed to oral antibiotics in the next 24 hours.  ADDENDUM Spoke with Dr. Watt Climes, he plans ERCP tomorrow, but only available tomorrow at [facility]. Per his d/w  endoscopy staff, request is transfer to [facility] today to expedite procedure tomorrow. Will pursue this, if bed situation allows, otherwise will need [facility] transport tomorrow.  DVT prophylaxis: Lovenox Code Status: DNR Family Communication: son-in-law at bedside Disposition Plan: home   Murray Hodgkins, MD  [facility] Direct contact: [phone] --[place]  --[email or web address]; password [email or web address]  7PM-7AM contact night coverage as above 05/16/2016, 11:29 AM  LOS: 1 day   Consultants:  [facility]  Procedures:    Antimicrobials:  Zosyn 1/3 >>  Interval history/Subjective: Patient reports right-sided upper abdominal pain.  Objective: Vitals:   05/15/16 2121 05/15/16 2337 05/16/16 0254 05/16/16 0518  BP:  (!) 142/90 123/76 (!) 113/58  Pulse:  93 (!) 135 93  Resp:  18 18 18   Temp:  98.5 F (36.9 C) 99.1 F (37.3 C) 98 F (36.7 C)  TempSrc:  Oral Oral Oral  SpO2: 100% 100% 100% 97%  Weight:    53.8 kg (118 lb 9.7 oz)  Height:        Intake/Output Summary (Last 24 hours) at 05/16/16 1129 Last data filed at 05/16/16 0500  Gross per 24 hour  Intake              100 ml  Output                0 ml  Net              100 ml     Filed Weights   05/15/16 1820 05/16/16 0518  Weight: 52.7 kg (116 lb 2.9 oz) 53.8 kg (118 lb 9.7 oz)    Exam:    Constitutional:   Appears chronically ill, nontoxic. Cachectic. Respiratory:   Clear to auscultation bilaterally. No wheezes, rales or rhonchi. Normal respiratory effort. Cardiovascular:   Regular  rate and rhythm. No murmur, rub or gallop. No lower extremity edema. Psychiatric:   Somewhat sleepy but arouses to voice and follows simple commands and answers simple questions.   I have personally reviewed following labs and imaging studies:  Total bilirubin slightly improved, 12.7. Alkaline phosphatase down, 616. AST 65. ALT normal, trending down. Potassium minimally low at 3.2.  Scheduled Meds: . amLODipine  10 mg  Oral Daily  . amphetamine-dextroamphetamine  10 mg Oral Q breakfast   And  . amphetamine-dextroamphetamine  5 mg Oral Q lunch  . enoxaparin (LOVENOX) injection  40 mg Subcutaneous Q24H  . FLUoxetine  40 mg Oral Daily  . folic acid  1 mg Oral Daily  . lipase/protease/amylase  12,000 Units Oral TID WC  . mometasone-formoterol  2 puff Inhalation BID  . multivitamin with minerals  1 tablet Oral Daily  . piperacillin-tazobactam (ZOSYN)  IV  3.375 g Intravenous Q8H  . potassium chloride  40 mEq Oral Once  . sodium chloride flush  3 mL Intravenous Q12H  . thiamine  100 mg Oral Daily   Or  . thiamine  100 mg Intravenous Daily   Continuous Infusions:  Principal Problem:   Obstructive jaundice Active Problems:   Alcohol abuse   Pancreatic cancer metastasized to liver (HCC)   Fever   Unintentional weight loss   Severe protein-calorie malnutrition ([place])   DNR (do not resuscitate)   Cholangitis   LOS: 1 day

## 2016-05-16 NOTE — Progress Notes (Signed)
Palliative Medicine consult noted. Due to high referral volume, there may be a delay seeing this patient. Please call the Palliative Medicine Team office at [phone] if recommendations are needed in the interim.  Thank you for inviting [place] to see this patient.  Marjie Skiff Chelcie Estorga, RN, BSN, [facility] 05/16/2016 3:02 PM Cell [phone] 8:00-4:00 Monday-Friday Office [phone]

## 2016-05-16 NOTE — Progress Notes (Signed)
Reginald Ochoa 12:07 PM  Subjective: Patient known to me from previous ERCP and his hospital computer chart reviewed as was his office computer chart and his case discussed with his son as well and currently unfortunately the patient is too lethargic based on getting Ativan and did unfortunately eat a little lunch but for 3 days has had increased yellow jaundice and some low-grade fevers and some increased right upper quadrant pain and supposedly for a month after his procedure he had done well and was feeling better and his jaundice had resolved and he has no new complaints and his CT and labs were reviewed  Objective: Vital signs stable afebrile no acute distress obviously jaundiced and cachectic decreased breath sounds and heart sounds abdomen is soft no obvious tenderness rare bowel sounds CT and labs reviewed as above  Assessment: Questionable stent partial occlusion in patient with progressive pancreatic cancer and metastases  Plan: I offered the patient and his son a repeat attempt at ERCP possibly stent change possibly another stent placed inside or cleaning out the current stent and again we discussed the risks benefits methods and possibility of being unsuccessful either due to the mass not letting us passed the scope or if we remove the current stent not being able to replace it and currently they believe they would like me to try tomorrow and I am currently working on setting that up with the endoscopy team we also talked about not doing the procedure and just keeping him comfortable which they are not quite ready for at this time  [facility]  Pager [phone] After 5PM or if no answer call [phone]

## 2016-05-16 NOTE — Progress Notes (Addendum)
[facility] coming to pick up patient sometime after 7pm. Tried to call report to receiving RN. Nurse secretary stated that all RNs were in a staff meeting and would not be able to receive report until 7:30pm. Jose, RN from [place] called back at [facility] to get report on patient. Left number in case she had additional questions.

## 2016-05-16 NOTE — Progress Notes (Signed)
Initial Nutrition Assessment  DOCUMENTATION CODES:   Severe malnutrition in context of chronic illness  INTERVENTION:   Provide Unjury Chicken Soup supplements, each provides 100 kcal and 21g protein RD will continue to monitor for plan and GOC  NUTRITION DIAGNOSIS:   Malnutrition related to chronic illness as evidenced by percent weight loss, energy intake < or equal to 75% for > or equal to 1 month, severe depletion of body fat, severe depletion of muscle mass.  GOAL:   Patient will meet greater than or equal to 90% of their needs  MONITOR:   PO intake, Supplement acceptance, Labs, Weight trends, I & O's  REASON FOR ASSESSMENT:   Malnutrition Screening Tool    ASSESSMENT:   76 year old man PMH stage IV pancreatic cancer, no longer a candidate for chemotherapy, hospice recommended the patient has refused thus far, status post biliary stent October 2017, presented to the [facility] for symptom management with complaint of progressive fatigue, weakness, fever, weight loss and jaundice. Admitted for obstructive jaundice, fever, pancreatic cancer with metastatic disease to liver. CT scan ordered and started on antibiotics for possible cholangitis.  Patient in room with son at bedside. Pt sleeping during visit. Pt's son reports pt did not eat any breakfast d/t lethargy. States he will try and get pt to eat some lunch later once more awake. Pt did not like Prostat supplements that were ordered from previous admission but pt's son is willing to have Unjury chicken soup supplements ordered for pt to try. Pt scheduled for ERCP tomorrow per GI note. Pt last received chemotherapy on 12/12 for pancreatic cancer. Will coordinate supplements with doses of CREON.  Per chart review, pt has lost 9 lb since 11/10 (7% wt loss x 2 months, significant for time frame). Nutrition-Focused physical exam completed. Findings are severe fat depletion, severe muscle depletion, and no edema.   Medications:  Folic acid tablet daily, CREON capsule TID, Multivitamin with minerals daily, K-DUR tablet once, Thiamine tablet daily Labs reviewed: Low K, Na  Diet Order:  Diet regular Room service appropriate? Yes; Fluid consistency: Thin Diet NPO time specified  Skin:  Reviewed, no issues  Last BM:  1/2  Height:   Ht Readings from Last 1 Encounters:  05/15/16 5\' 6"  (1.676 m)    Weight:   Wt Readings from Last 1 Encounters:  05/16/16 118 lb 9.7 oz (53.8 kg)    Ideal Body Weight:  64.5 kg  BMI:  Body mass index is 19.14 kg/m.  Estimated Nutritional Needs:   Kcal:  1700-1900  Protein:  80-90g  Fluid:  1.7-1.9L/day  EDUCATION NEEDS:   No education needs identified at this time  Clayton Bibles, MS, RD, LDN Pager: [phone] After Hours Pager: [phone]

## 2016-05-17 ENCOUNTER — Inpatient Hospital Stay (HOSPITAL_COMMUNITY): Payer: Medicare PPO | Admitting: Anesthesiology

## 2016-05-17 ENCOUNTER — Encounter (HOSPITAL_COMMUNITY)
Admission: AD | Disposition: A | Discharge status: Hospice | Payer: Self-pay | Source: Ambulatory Visit | Attending: Internal Medicine

## 2016-05-17 ENCOUNTER — Inpatient Hospital Stay (HOSPITAL_COMMUNITY): Payer: Medicare PPO

## 2016-05-17 ENCOUNTER — Encounter (HOSPITAL_COMMUNITY): Payer: Self-pay | Admitting: *Deleted

## 2016-05-17 ENCOUNTER — Ambulatory Visit: Payer: Medicare Other | Admitting: Hematology

## 2016-05-17 ENCOUNTER — Other Ambulatory Visit: Payer: Medicare Other

## 2016-05-17 HISTORY — PX: ENDOSCOPIC RETROGRADE CHOLANGIOPANCREATOGRAPHY (ERCP) WITH PROPOFOL: SHX5810

## 2016-05-17 SURGERY — ENDOSCOPIC RETROGRADE CHOLANGIOPANCREATOGRAPHY (ERCP) WITH PROPOFOL
Anesthesia: General

## 2016-05-17 MED ORDER — ESMOLOL HCL 100 MG/10ML IV SOLN
INTRAVENOUS | Status: DC | PRN
  Administered 2016-05-17 (×8): 10 mg via INTRAVENOUS

## 2016-05-17 MED ORDER — GLUCAGON HCL RDNA (DIAGNOSTIC) 1 MG IJ SOLR
INTRAMUSCULAR | Status: AC
  Filled 2016-05-17: qty 1

## 2016-05-17 MED ORDER — PROPOFOL 10 MG/ML IV BOLUS
INTRAVENOUS | Status: DC | PRN
  Administered 2016-05-17: 130 mg via INTRAVENOUS

## 2016-05-17 MED ORDER — INDOMETHACIN 50 MG RE SUPP
RECTAL | Status: AC
  Filled 2016-05-17: qty 2

## 2016-05-17 MED ORDER — LIDOCAINE HCL 4 % EX SOLN
CUTANEOUS | Status: DC | PRN
  Administered 2016-05-17: 3 mL via TOPICAL

## 2016-05-17 MED ORDER — IOPAMIDOL (ISOVUE-300) INJECTION 61%
INTRAVENOUS | Status: AC
  Filled 2016-05-17: qty 50

## 2016-05-17 MED ORDER — LACTATED RINGERS IV SOLN
INTRAVENOUS | Status: DC | PRN
  Administered 2016-05-17: 10:00:00 via INTRAVENOUS

## 2016-05-17 MED ORDER — ALBUTEROL SULFATE HFA 108 (90 BASE) MCG/ACT IN AERS
INHALATION_SPRAY | RESPIRATORY_TRACT | Status: DC | PRN
  Administered 2016-05-17: 2 via RESPIRATORY_TRACT

## 2016-05-17 MED ORDER — SUCCINYLCHOLINE CHLORIDE 20 MG/ML IJ SOLN
INTRAMUSCULAR | Status: DC | PRN
  Administered 2016-05-17: 30 mg via INTRAVENOUS

## 2016-05-17 MED ORDER — LIDOCAINE HCL (CARDIAC) 20 MG/ML IV SOLN
INTRAVENOUS | Status: DC | PRN
  Administered 2016-05-17: 60 mg via INTRAVENOUS

## 2016-05-17 MED ORDER — SODIUM CHLORIDE 0.9 % IV SOLN
INTRAVENOUS | Status: DC | PRN
  Administered 2016-05-17: 50 mL

## 2016-05-17 MED ORDER — PHENYLEPHRINE HCL 10 MG/ML IJ SOLN
INTRAVENOUS | Status: DC | PRN
  Administered 2016-05-17: 50 ug/min via INTRAVENOUS

## 2016-05-17 MED ORDER — FENTANYL CITRATE (PF) 100 MCG/2ML IJ SOLN
INTRAMUSCULAR | Status: DC | PRN
  Administered 2016-05-17 (×6): 25 ug via INTRAVENOUS

## 2016-05-17 NOTE — Progress Notes (Signed)
[facility] arrived at 0150 to transport the [facility] to [facility]. Patient's wife and sister rode separately. All belongings sent with family members.

## 2016-05-17 NOTE — Anesthesia Procedure Notes (Signed)
Procedure Name: Intubation Date/Time: 05/17/2016 9:58 AM Performed by: Suzy Bouchard Pre-anesthesia Checklist: Patient identified, Emergency Drugs available, Suction available, Patient being monitored and Timeout performed Patient Re-evaluated:Patient Re-evaluated prior to inductionOxygen Delivery Method: Circle system utilized Preoxygenation: Pre-oxygenation with 100% oxygen Intubation Type: IV induction Laryngoscope Size: Miller and 2 Grade View: Grade I Tube type: Oral Tube size: 7.5 mm Number of attempts: 1 Airway Equipment and Method: Stylet and LTA kit utilized Placement Confirmation: ETT inserted through vocal cords under direct vision,  CO2 detector and breath sounds checked- equal and bilateral Secured at: 22 cm Tube secured with: Tape Dental Injury: Teeth and Oropharynx as per pre-operative assessment

## 2016-05-17 NOTE — Transfer of Care (Signed)
Immediate Anesthesia Transfer of Care Note  Patient: Reginald Ochoa  Procedure(s) Performed: Procedure(s): ENDOSCOPIC RETROGRADE CHOLANGIOPANCREATOGRAPHY (ERCP) WITH PROPOFOL (N/A)  Patient Location: Endoscopy Unit  Anesthesia Type:General  Level of Consciousness: awake  Airway & Oxygen Therapy: Patient Spontanous Breathing and Patient connected to nasal cannula oxygen  Post-op Assessment: Report given to RN and Post -op Vital signs reviewed and stable  Post vital signs: Reviewed and stable  Last Vitals:  Vitals:   05/17/16 0911 05/17/16 1133  BP: (!) 107/57 96/70  Pulse: (!) 137 (!) 127  Resp: (!) 22 (!) 23  Temp: 37.1 C 37.4 C    Last Pain:  Vitals:   05/17/16 1133  TempSrc: Oral  PainSc: 0-No pain      Patients Stated Pain Goal: 2 ([id number])  Complications: No apparent anesthesia complications

## 2016-05-17 NOTE — Progress Notes (Signed)
[facility] Liaison:  Notified by Paulita Cradle, of patient/family request for Hospice and [facility] services at home after discharge.  Chart and patient information reviewed with Dr. Brigid Re, [facility] Director, and hospice eligibility confirmed.    I spoke with patient and [name], wife, at bedside to initiate education related to hospice philosophy, services and team approach to care.  Family voiced understanding of the information provided.    Per discussion, plan is for patient to discharge home on 05/18/16.  Family states they may try to take patient home by private vehicle, but have not decided at this time, as he may need an ambulance tomorrow secondary to weakness.  Please send signed and completed DNR form home with patient/family.   Patient will need prescriptions for discharge comfort medications.  DME needs discussed.  Patient/family are requesting narrow wheelchair (they have a full size one now that is too big to fit down hallways), and shower stool.  They will need a hospital bed and over the bed table at some point, but are wanting to get that later, after they can move furniture and decide "where we want him to live".   [facility] equipment manager Jewel Ysidro Evert notified and will contact [place] to arrange delivery to the home.  The home address has been verified and is correct in the chart; Norris Cross (son) is the family member to be contacted to arrange time of delivery.  [facility] aware of the above.   Completed d/c summary will need to be faxed to [facility] at [phone] when final.  Please notify [facility] when patient is ready to leave unit at discharge --call [phone] (or [phone] after 5pm). [facility] information and contact numbers have been given to [place], wife, during the visit.   Please call with any questions to [phone].  Thank you,   Edyth Gunnels, Rn, [facility] Liaison.   **All Hospital Liaisons are now on  AMION.  Please feel free to call me directly at the above number or hospice at [phone].

## 2016-05-17 NOTE — Anesthesia Preprocedure Evaluation (Addendum)
Anesthesia Evaluation  Patient identified by MRN, date of birth, ID band Patient awake    Reviewed: Allergy & Precautions, NPO status , Patient's Chart, lab work & pertinent test results  Airway Mallampati: II  TM Distance: >3 FB Neck ROM: Full    Dental  (+) Edentulous Upper, Edentulous Lower   Pulmonary asthma , Current Smoker,    breath sounds clear to auscultation       Cardiovascular hypertension, Pt. on medications  Rhythm:Regular     Neuro/Psych  Headaches, Depression TIA (2009)   GI/Hepatic GERD  ,  Endo/Other    Renal/GU Renal InsufficiencyRenal disease     Musculoskeletal   Abdominal   Peds  Hematology   Anesthesia Other Findings Discussed DNR and procedure with patient and wife. They understand he will have a breathing tube and be on ventilator for the case, and will have vasoactive medications as needed during the procedure.  Reproductive/Obstetrics                           Anesthesia Physical Anesthesia Plan  ASA: III  Anesthesia Plan: General   Post-op Pain Management:    Induction: Intravenous  Airway Management Planned: Oral ETT  Additional Equipment: None  Intra-op Plan:   Post-operative Plan: Extubation in OR  Informed Consent: I have reviewed the patients History and Physical, chart, labs and discussed the procedure including the risks, benefits and alternatives for the proposed anesthesia with the patient or authorized representative who has indicated his/her understanding and acceptance.   Dental advisory given  Plan Discussed with: CRNA and Surgeon  Anesthesia Plan Comments:         Anesthesia Quick Evaluation

## 2016-05-17 NOTE — Care Management Note (Signed)
Case Management Note  Patient Details  Name: SIMAO SAPUTO MRN: [id number] Date of Birth: April 29, 1941  Subjective/Objective:                    Action/Plan:  Discussed home with hospice with patient's wife at bedside. Choice offered , they would like [facility] and [facility].  Patient already has walker, bedside commode, wheelchair at home.  Wife requesting a shower stool or chair ( bedside commode does not fit in shower), a wheelchair that is narrower ( on current wheelchair " brakes stick out which makes it too wide for hallway"), hospital bed , condom catheters   Wife was told discharge is tomorrow 05-18-16 , she will decide at that time if ambulance transportation is needed. Will start ambulance transport forms in case.   Confirmed face sheet information with wife. Wife states she has "lots "of people to help at home.   Referral given to [facility] . Expected Discharge Date:   (UNKNOWN)               Expected Discharge Plan:  Home w Hospice Care  In-House Referral:     Discharge planning Services  CM Consult  Post Acute Care Choice:    Choice offered to:  Patient, Spouse  DME Arranged:    DME Agency:     HH Arranged:    Comfort Agency:     Status of Service:  In process, will continue to follow  If discussed at Long Length of Stay Meetings, dates discussed:    Additional Comments:  Marilu Favre, RN 05/17/2016, 12:49 PM

## 2016-05-17 NOTE — Consult Note (Signed)
Consultation Note Date: 05/17/2016   Patient Name: Reginald Ochoa  DOB: 1940-09-29  MRN: [id number]  Age / Sex: 76 y.o., male  PCP: Reginald Lei, MD Referring Physician: Lavina Hamman, MD  Reason for Consultation: Disposition, Establishing goals of care and Psychosocial/spiritual support  HPI/Patient Profile: 76 y.o. male  with past medical history of stage IV pancreatic cancer with prior ERCP and stent placement admitted on 05/15/2016 with progressive fatigue and jaundice.   Clinical Assessment and Goals of Care: I met today with patient, his wife, and his sister-in-law.  Reginald Ochoa is very weak and tired and does not participate much in conversation. He does listen to conversation at the end of encounter his wife specifically asked if he agreed with plan we develop. He stated "yes, that sounds good."  We discussed at length his clinical course over the past several weeks to months including continued decline in nutrition, functional status, and cognition. We discussed that he has a noncurable cancer has continued to progress. His family has a good understanding of his disease and process and report that the focus they want moving forward is on his comfort and dignity. The most important things to him are his family and being able to remain in his own home or long as possible.  We discussed hospice and how this could benefit in these goals. We discussed the role of aggressive symptom management and allowing him to maintain his functional status is much as possible which is the best way to that time and quality to his life at this point in time.  SUMMARY OF RECOMMENDATIONS   - Plan for ERCP tomorrow - Following procedure, his overall goal is to get home with hospice support once he is medically ready for discharge.  I will place an order for care management to discuss with family regarding options for home  hospice tomorrow. - He reports being too weak and tired to discuss further symptom management this evening. If he desires, I will be happy to make adjustments to his regimen prior to discharge.  If not, this is something that this will to be able to manage after he gets home.  Code Status/Advance Care Planning:  DNR   Symptom Management:   He denies any acute needs. Continue same at this time  Palliative Prophylaxis:   Frequent Pain Assessment  Additional Recommendations (Limitations, Scope, Preferences):  Avoid Hospitalization and Full Comfort Care following this admission  Psycho-social/Spiritual:   Desire for further Chaplaincy support:no  Additional Recommendations: Education on Hospice  Prognosis:   < 3 months  Discharge Planning: Home with Hospice      Primary Diagnoses: Present on Admission: . Alcohol abuse . DNR (do not resuscitate) . Pancreatic cancer metastasized to liver ([place]) . Severe protein-calorie malnutrition ([place]) . Unintentional weight loss . Fever   I have reviewed the medical record, interviewed the patient and family, and examined the patient. The following aspects are pertinent.  Past Medical History:  Diagnosis Date  . Alcohol abuse   . Asthma   .  Cancer [facility])    pancreatic with liver lesions  . Complication of anesthesia    PMH: hard to put to sleep  . Depression   . Depression with anxiety   . GERD (gastroesophageal reflux disease)   . GIB (gastrointestinal bleeding)   . Headache   . Heartburn   . Hypertension   . Neuromuscular disorder ([place])    pinched nerve  . Pancreatic mass   . Pneumonia   . TIA (transient ischemic attack)   . Tobacco abuse    Social History   Social History  . Marital status: Married    Spouse name: Reginald Ochoa  . Number of children: 3  . Years of education: N/A   Occupational History  . Author     Architect   Social History Main Topics  . Smoking status: Current Every Day Smoker     Packs/day: 0.50    Years: 40.00    Types: Cigarettes, Cigars  . Smokeless tobacco: Never Used  . Alcohol use Yes     Comment: used to be a heavy drinker and stopped 20 years ago    . Drug use: No  . Sexual activity: No   Other Topics Concern  . None   Social History Narrative   Married to wife, Reginald Ochoa for 35+ years. 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 Stroke Mother   . Cancer Maternal Uncle     bvrain tumor   . Cancer Cousin     brain tumor   . Diabetes Father    Scheduled Meds: . [MAR Hold] amLODipine  10 mg Oral Daily  . [MAR Hold] amphetamine-dextroamphetamine  10 mg Oral Q breakfast   And  . [MAR Hold] amphetamine-dextroamphetamine  5 mg Oral Q lunch  . [MAR Hold] enoxaparin (LOVENOX) injection  40 mg Subcutaneous Q24H  . [MAR Hold] FLUoxetine  40 mg Oral Daily  . [MAR Hold] folic acid  1 mg Oral Daily  . [MAR Hold] lipase/protease/amylase  12,000 Units Oral TID WC  . [MAR Hold] mometasone-formoterol  2 puff Inhalation BID  . [MAR Hold] multivitamin with minerals  1 tablet Oral Daily  . [MAR Hold] piperacillin-tazobactam (ZOSYN)  IV  3.375 g Intravenous Q8H  . [MAR Hold] protein supplement  8 oz Oral TID  . [MAR Hold] sodium chloride flush  3 mL Intravenous Q12H  . [MAR Hold] thiamine  100 mg Oral Daily   Or  . [MAR Hold] thiamine  100 mg Intravenous Daily   Continuous Infusions: . sodium chloride     PRN Meds:.[MAR Hold] sodium chloride, [MAR Hold] ALPRAZolam, [MAR Hold] HYDROcodone-acetaminophen, [MAR Hold] ibuprofen, [MAR Hold] ondansetron **OR** [MAR Hold] ondansetron (ZOFRAN) IV, [MAR Hold] polyethylene glycol, [MAR Hold] senna-docusate, [MAR Hold] sodium chloride flush, [MAR Hold] traMADol Medications Prior to Admission:  Prior to Admission medications     Medication Sig Start Date End Date Taking? Authorizing Provider  albuterol (PROVENTIL HFA;VENTOLIN HFA) 108 (90 BASE) MCG/ACT inhaler Inhale 2 puffs into the lungs every 6 (six) hours as needed for wheezing or shortness of breath.   Yes Historical Provider, MD  ALPRAZolam Duanne Moron) 1 MG tablet Take 0.5-1 mg by mouth 2 (two) times daily as needed for anxiety or sleep (take 0.50m during the day  if needed and 72m at night to help sleep). Anxiety    Yes Historical Provider, MD  amLODipine (NORVASC) 10 MG tablet Take 10 mg by mouth daily.   Yes Historical Provider, MD  amphetamine-dextroamphetamine (ADDERALL) 10 MG tablet Take 5-10 mg by mouth 2 (two) times daily with a meal. Pt takes one tablet with breakfast and one-half tablet with lunch.   Yes Historical Provider, MD  aspirin EC 81 MG tablet Take 1 tablet (81 mg total) by mouth daily. Patient taking differently: Take 81 mg by mouth every evening.  04/13/16  Yes CBarton Dubois MD  budesonide-formoterol ([facility] 160-4.5 MCG/ACT inhaler Inhale 2 puffs into the lungs 2 (two) times daily as needed (shortness of breath/wheezing).    Yes Historical Provider, MD  cetirizine (ZYRTEC) 10 MG tablet Take 10 mg by mouth at bedtime.   Yes Historical Provider, MD  cholecalciferol (VITAMIN D) 1000 units tablet Take 1,000 Units by mouth at bedtime.    Yes Historical Provider, MD  FLUoxetine (PROZAC) 40 MG capsule Take 40 mg by mouth daily.    Yes Historical Provider, MD  HYDROcodone-acetaminophen (NORCO) 10-325 MG tablet Take 1 tablet by mouth every 8 (eight) hours as needed for severe pain. 04/13/16  Yes CBarton Dubois MD  ibuprofen (ADVIL,MOTRIN) 200 MG tablet Take 200 mg by mouth every 6 (six) hours as needed for headache, mild pain or moderate pain.    Yes Historical Provider, MD  levofloxacin (LEVAQUIN) 500 MG tablet Take 1 tablet (500 mg total) by mouth daily. 05/08/16  Yes YTruitt Merle MD  LUTEIN PO Take 1 tablet by mouth daily.   Yes Historical Provider, MD   Multiple Vitamins-Minerals (MULTIVITAMIN WITH MINERALS) tablet Take 1 tablet by mouth daily.    Yes Historical Provider, MD  ondansetron (ZOFRAN ODT) 8 MG disintegrating tablet Take 1 tablet (8 mg total) by mouth every 8 (eight) hours as needed for nausea or vomiting. 03/22/16  Yes YTruitt Merle MD  oxymetazoline (AFRIN) 0.05 % nasal spray Place 1 spray into both nostrils 2 (two) times daily as needed for congestion.   Yes Historical Provider, MD  Pancrelipase, Lip-Prot-Amyl, (CREON) 24000-76000 units CPEP Take 1 capsule (24,000 Units total) by mouth 3 (three) times daily with meals. 04/29/16  Yes YTruitt Merle MD  polyethylene glycol ([facility]/ GLYCOLAX) packet Take 17 g by mouth daily as needed for mild constipation. 04/13/16  Yes CBarton Dubois MD  prochlorperazine (COMPAZINE) 10 MG tablet Take 10 mg by mouth every 6 (six) hours as needed for nausea or vomiting.   Yes Historical Provider, MD  senna-docusate (SENOKOT-S) 8.6-50 MG tablet Take 1 tablet by mouth at bedtime as needed for mild constipation. 03/07/16  Yes PLavina Hamman MD  traMADol (ULTRAM) 50 MG tablet Take 1 tablet (50 mg total) by mouth every 12 (twelve) hours as needed for moderate pain or severe pain. 04/17/16  Yes YTruitt Merle MD  dronabinol (MARINOL) 2.5 MG capsule Take 1 capsule (2.5 mg total) by mouth 2 (two) times daily before a meal. Patient not taking: Reported on 05/15/2016 05/08/16   YTruitt Merle MD   No Known Allergies Review of Systems  Unable to perform ROS H reports being too tired to participate  Physical Exam  General: Cachectic, frail, elderly appering, chronically ill, lying in bed and will arouse and appropriately answer questions, but sleeps through most of encounter Heart: Regular rate and rhythm. No murmur appreciated. Lungs: Diminished air movement, clear Abdomen: Soft, mild tender, nondistended, positive bowel sounds.  Ext:  No significant edema Skin: Warm and dry, jaundiced, scleral icterus noted Neuro: Grossly  intact, nonfocal.  Vital Signs: BP 118/74 (BP Location: Left Arm)   Pulse 86   Temp 99.7 F (37.6 C) (Oral)   Resp 16   Ht _0  (1.676 m)   Wt 50.8 kg (111 lb 15.9 oz)   SpO2 96%   BMI 18.08 kg/m  Pain Assessment: 0-10   Pain Score: 4    SpO2: SpO2: 96 % O2 Device:SpO2: 96 % O2 Flow Rate: .   IO: Intake/output summary:  Intake/Output Summary (Last 24 hours) at 05/17/16 0857 Last data filed at 05/17/16 0522  Gross per 24 hour  Intake                0 ml  Output              200 ml  Net             -200 ml    LBM: Last BM Date: 05/16/16 Baseline Weight: Weight: 52.7 kg (116 lb 2.9 oz) Most recent weight: Weight: 50.8 kg (111 lb 15.9 oz)     Palliative Assessment/Data:   Flowsheet Rows   Flowsheet Row Most Recent Value  Intake Tab  Referral Department  Hospitalist  Unit at Time of Referral  Med/Surg Unit  Palliative Care Primary Diagnosis  Cancer  Date Notified  05/16/16  Palliative Care Type  New Palliative care  Reason for referral  Clarify Goals of Care, End of Life Care Assistance  Date of Admission  05/15/16  # of days IP prior to Palliative referral  1  Clinical Assessment  Palliative Performance Scale Score  30%  Pain Max last 24 hours  Not able to report  Pain Min Last 24 hours  Not able to report  Psychosocial & Spiritual Assessment  Palliative Care Outcomes  Patient/Family meeting held?  Yes  Who was at the meeting?  Patient, sister in law, wife      Time In: 1920 Time Out: 2025 Time Total: 61 Greater than 50%  of this time was spent counseling and coordinating care related to the above assessment and plan.  Signed by: Micheline Rough, MD   Please contact Palliative Medicine Team phone at [phone] for questions and concerns.  For individual provider: See Shea Evans

## 2016-05-17 NOTE — Progress Notes (Signed)
Reginald Ochoa 9:38 AM  Subjective: Patient doing better today than yesterday without any new complaints and his procedure was rediscussed with other family members and all their questions were answered  Objective: Vital signs stable afebrile no acute distress obviously jaundiced exam please see preassessment evaluation decrease heart and lung sounds bilaterally no new labs  Assessment: Possible stent occlusion  Plan: Okay to try ERCP today with anesthesia assistance and either unclog stent change it or pllace one inside and hopefully we will be able to pass the scope which was discussed with the patient and his family  [facility]  Pager [phone] After 5PM or if no answer call [phone]

## 2016-05-17 NOTE — Op Note (Signed)
[facility] Patient Name: Reginald Ochoa Procedure Date : 05/17/2016 MRN: [id number] Attending MD: Clarene Essex , MD Date of Birth: Sep 29, 1940 CSN: [id number] Age: 76 Admit Type: Inpatient Procedure:                ERCP Indications:              Biliary dilation on Computed Tomogram Scan,                            Suspected ascending cholangitis, Jaundice Providers:                Clarene Essex, MD, Carolynn Comment, RN, Alfonso Patten,                            Technician, Ivar Drape CRNA, CRNA Referring MD:              Medicines:                General Anesthesia Complications:            No immediate complications. Estimated Blood Loss:     Estimated blood loss: none. Procedure:                Pre-Anesthesia Assessment:                           - Prior to the procedure, a History and Physical                            was performed, and patient medications and                            allergies were reviewed. The patient's tolerance of                            previous anesthesia was also reviewed. The risks                            and benefits of the procedure and the sedation                            options and risks were discussed with the patient.                            All questions were answered, and informed consent                            was obtained. Prior Anticoagulants: The patient has                            taken aspirin, last dose was 1 day prior to                            procedure. ASA Grade Assessment: III - A patient  with severe systemic disease. After reviewing the                            risks and benefits, the patient was deemed in                            satisfactory condition to undergo the procedure.                           After obtaining informed consent, the scope was                            passed under direct vision. Throughout the                            procedure, the  patient's blood pressure, pulse, and                            oxygen saturations were monitored continuously. The                            [id number]) scope was introduced through                            the mouth, and used to inject contrast into and                            used to locate the major papilla. The ERCP was                            technically difficult and complex due to extrinsic                            compression. We did the procedure with him on his                            left side which made passing the scope easier but                            it was still difficult to get the scope in the                            proper position and his distal bulb and proximal                            second portion of the duodenum was compressed The                            patient tolerated the procedure well. Scope In: Scope Out: Findings:      One metal stent originating in the biliary tree was emerging from the       major papilla.it seems to have tumor ingrowth The stent was partially  occluded. A long 0.035 inch Soft Jagwire was passed into the biliary       tree using the sphincterotome but we initially had trouble advancing       both are dilating balloons and our stone balloon deep into the ductand       the wire was repositioned and then we were able to get deep selective       access. The biliary tree was swept with a 12 mm balloon starting at the       upper third of the main bile duct. Sludge was swept from the duct. a       significant stricture not allowing any drainage was seen approximately       1-1/2-2 cm from the proximal margin of the stent and we proceeded with       Dilation of the common bile duct with a 4 cm by 6 mm balloon dilator was       successful. on multiple balloon pull-through was a moderate amount of       sludge and pus was removed but after our dilation we elected to place       One 10 Fr by 6 cm covered  metal stent was placed 5.5 cm into the common       bile duct. The stent was in good position. and there was some external       compression of that stent seen with fluoroscopy Impression:               - One partially occluded stent from the biliary                            tree was seen in the major papilla. probable tumor                            ingrowth                           - The biliary tree was swept and sludge and pus was                            found. this was difficult prior to dilation but                            much easier after dilation                           - Common bile duct was successfully dilated.                           - One covered metal stent was placed into the                            common bile duct. Moderate Sedation:      moderate sedation-none Recommendation:           - Clear liquid diet today. may slowly advance if no                            delayed complication                           -  Continue present medications.                           - Return to GI clinic PRN.                           - Telephone GI clinic if symptomatic PRN.                           - Check liver enzymes (AST, ALT, alkaline                            phosphatase, bilirubin) tomorrow. Procedure Code(s):        --- Professional ---                           [phone], Esophagogastroduodenoscopy, flexible,                            transoral; diagnostic, including collection of                            specimen(s) by brushing or washing, when performed                            (separate procedure) Diagnosis Code(s):        --- Professional ---                           [id number], Other mechanical complication of bile                            duct prosthesis, initial encounter                           R17, Unspecified jaundice                           K83.8, Other specified diseases of biliary tract CPT copyright 2016 [employer].  All rights reserved. The codes documented in this report are preliminary and upon coder review may  be revised to meet current compliance requirements. Clarene Essex, MD 05/17/2016 11:28:45 AM This report has been signed electronically. Number of Addenda: 0

## 2016-05-17 NOTE — Progress Notes (Signed)
[facility] Progress Note  Patient: Reginald Ochoa [id number]   PCP: Elyn Peers, MD DOB: 03-25-1941   DOA: 05/15/2016   DOS: 05/17/2016   Date of Service: the patient was seen and examined on 05/17/2016  Brief hospital course: Pt. with PMH of Stage IV pancreatic cancer, alcohol abuse; admitted on 05/15/2016, with complaint of fatigue, was found to have blockage of the pancreatic stent. Currently further plan is monitor post-ERCP course.  Assessment and Plan: 1. Obstructive jaundice Stage IV pancreatic cancer. Patient is S/P ERCP with biliary stent placement on 11/17. Admitted for possible cholangitis and started on IV antibiotics. He underwent ERCP on 05/17/2016, did have blockage of the stent as well as pus collection in the stent. GI would like to continue IV antibiotics for 1 more day. We'll recheck labs tomorrow morning. Monitor postoperative course.  2. Goals of care discussion. Patient is stage IV pancreatic cancer with poor functional status. Not a candidate for chemotherapy. Definitely not a surgical candidate. Hospice recommended by oncology. Family agrees with the plan with plan to go home with hospice. She had palliative care input.  3. Severe protein calorie malnutrition. In the setting of chronic malignancy as well as poor oral intake. Likely expected to further worsening. Monitor. Plan is to go home with hospice.  Bowel regimen: last BM 05/16/2016 Diet: clear liquid diet DVT Prophylaxis: mechanical compression device  Advance goals of care discussion: DNR DNI, home with hospice  Family Communication: family was present at bedside, at the time of interview. The pt provided permission to discuss medical plan with the family. Opportunity was given to ask question and all questions were answered satisfactorily.   Disposition:  Discharge to home with hospice. Expected discharge date: 05/18/2016  Consultants: Medical oncology, gastroenterology, palliative  care Procedures: ERCP  Antibiotics: Anti-infectives    Start     Dose/Rate Route Frequency Ordered Stop   05/15/16 1815  piperacillin-tazobactam (ZOSYN) IVPB 3.375 g     3.375 g 12.5 mL/hr over 240 Minutes Intravenous Every 8 hours 05/15/16 1810       Subjective: Seen postprocedure, remains lethargic.  Objective: Physical Exam: Vitals:   05/17/16 1140 05/17/16 1150 05/17/16 1200 05/17/16 1230  BP: 104/67 108/82 103/71 119/78  Pulse: (!) 113 (!) 132 91 90  Resp: (!) 21 18 12 16   Temp:      TempSrc:    Oral  SpO2: 96% 94% 95% 95%  Weight:      Height:        Intake/Output Summary (Last 24 hours) at 05/17/16 1704 Last data filed at 05/17/16 1635  Gross per 24 hour  Intake              480 ml  Output              555 ml  Net              -75 ml   Filed Weights   05/15/16 1820 05/16/16 0518 05/17/16 0500  Weight: 52.7 kg (116 lb 2.9 oz) 53.8 kg (118 lb 9.7 oz) 50.8 kg (111 lb 15.9 oz)    General: Alert, Awake and Oriented to Person. Appear in moderate distress, affect appropriate Eyes: PERRL, Conjunctiva normal ENT: Oral Mucosa clear moist. Neck: difficult to assess JVD, no Abnormal Mass Or lumps Cardiovascular: S1 and S2 Present, no Murmur, Respiratory: Bilateral Air entry equal and Decreased, no use of accessory muscle, Clear to Auscultation, no Crackles, no wheezes Abdomen: Bowel Sound present, Soft and mild  tenderness Skin: no redness, no Rash, no induration Extremities: no Pedal edema, no calf tenderness Neurologic: Grossly no focal neuro deficit. Lethargic Data Reviewed: CBC:  Recent Labs Lab 05/15/16 1103 05/15/16 1904  WBC 17.3* 11.6*  NEUTROABS 13.8*  --   HGB 10.9* 10.5*  HCT 32.0* 30.7*  MCV 89.6 88.0  PLT 523* Q000111Q*   Basic Metabolic Panel:  Recent Labs Lab 05/15/16 1103 05/15/16 1904 05/16/16 0615  NA 136  --  133*  K 3.6  --  3.2*  CL  --   --  97*  CO2 26  --  25  GLUCOSE 155*  --  170*  BUN 19.6  --  23*  CREATININE 1.0 0.71 0.70    CALCIUM 9.4  --  8.3*    Liver Function Tests:  Recent Labs Lab 05/15/16 1103 05/16/16 0615  AST 78* 65*  ALT 84* 61  ALKPHOS 974* 616*  BILITOT 13.80* 12.7*  PROT 6.5 5.5*  ALBUMIN 2.9* 2.7*   No results for input(s): LIPASE, AMYLASE in the last 168 hours. No results for input(s): AMMONIA in the last 168 hours. Coagulation Profile: No results for input(s): INR, PROTIME in the last 168 hours. Cardiac Enzymes: No results for input(s): CKTOTAL, CKMB, CKMBINDEX, TROPONINI in the last 168 hours. BNP (last 3 results) No results for input(s): PROBNP in the last 8760 hours.  CBG: No results for input(s): GLUCAP in the last 168 hours.  Studies: Dg Ercp  Result Date: 05/17/2016 CLINICAL DATA:  76 year old male with pancreatic cancer obstructing biliary system. Evidence on recent CT of duct occlusion/stent occlusion EXAM: ERCP TECHNIQUE: Multiple spot images obtained with the fluoroscopic device and submitted for interpretation post-procedure. FLUOROSCOPY TIME:  6 minutes 58 seconds COMPARISON:  CT 05/15/2016 FINDINGS: Limited intraoperative fluoroscopic spot images of ERCP. Initial images demonstrate endoscope projecting over the upper abdomen with a existing metallic stent in place. Subsequent there is cannulation of the ampulla and guidewire retrograde within the extrahepatic biliary system. Injection of contrast demonstrates stent obstruction with dilated extrahepatic bile ducts. Final images demonstrate re- lining of the previous stent with a new metallic stent. IMPRESSION: Limited images during ERCP demonstrating re- lining of the previously placed metallic stent with a new metallic common bile duct stent for treatment of obstructed biliary system. Please refer to the dictated operative report for full details of intraoperative findings and procedure. Signed, Dulcy Fanny. Earleen Newport DO [facility] These images were submitted for  radiologic interpretation only. Please see the procedural report for the amount of contrast and the fluoroscopy time utilized. Electronically Signed   By: Corrie Mckusick D.O.   On: 05/17/2016 12:13     Scheduled Meds: . amLODipine  10 mg Oral Daily  . amphetamine-dextroamphetamine  10 mg Oral Q breakfast   And  . amphetamine-dextroamphetamine  5 mg Oral Q lunch  . enoxaparin (LOVENOX) injection  40 mg Subcutaneous Q24H  . FLUoxetine  40 mg Oral Daily  . folic acid  1 mg Oral Daily  . lipase/protease/amylase  12,000 Units Oral TID WC  . mometasone-formoterol  2 puff Inhalation BID  . multivitamin with minerals  1 tablet Oral Daily  . piperacillin-tazobactam (ZOSYN)  IV  3.375 g Intravenous Q8H  . protein supplement  8 oz Oral TID  . sodium chloride flush  3 mL Intravenous Q12H  . thiamine  100 mg Oral Daily   Or  . thiamine  100 mg Intravenous Daily   Continuous Infusions: .  sodium chloride     PRN Meds: sodium chloride, ALPRAZolam, HYDROcodone-acetaminophen, ibuprofen, ondansetron **OR** ondansetron (ZOFRAN) IV, polyethylene glycol, senna-docusate, sodium chloride flush, traMADol  Time spent: 30 minutes  Author: Berle Mull, MD [facility] Pager: [phone] 05/17/2016 5:04 PM  If 7PM-7AM, please contact night-coverage at [email or web address], password [email or web address]

## 2016-05-18 LAB — COMPREHENSIVE METABOLIC PANEL
ALK PHOS: 504 U/L — AB (ref 38–126)
ALT: 54 U/L (ref 17–63)
AST: 75 U/L — ABNORMAL HIGH (ref 15–41)
Albumin: 1.9 g/dL — ABNORMAL LOW (ref 3.5–5.0)
Anion gap: 9 (ref 5–15)
BUN: 21 mg/dL — ABNORMAL HIGH (ref 6–20)
CALCIUM: 8.3 mg/dL — AB (ref 8.9–10.3)
CHLORIDE: 100 mmol/L — AB (ref 101–111)
CO2: 28 mmol/L (ref 22–32)
CREATININE: 0.97 mg/dL (ref 0.61–1.24)
Glucose, Bld: 130 mg/dL — ABNORMAL HIGH (ref 65–99)
Potassium: 3.4 mmol/L — ABNORMAL LOW (ref 3.5–5.1)
Sodium: 137 mmol/L (ref 135–145)
Total Bilirubin: 11.7 mg/dL — ABNORMAL HIGH (ref 0.3–1.2)
Total Protein: 5.3 g/dL — ABNORMAL LOW (ref 6.5–8.1)

## 2016-05-18 LAB — CBC WITH DIFFERENTIAL/PLATELET
Basophils Absolute: 0 10*3/uL (ref 0.0–0.1)
Basophils Relative: 0 %
EOS PCT: 1 %
Eosinophils Absolute: 0.2 10*3/uL (ref 0.0–0.7)
HCT: 25.7 % — ABNORMAL LOW (ref 39.0–52.0)
Hemoglobin: 9 g/dL — ABNORMAL LOW (ref 13.0–17.0)
LYMPHS ABS: 1.4 10*3/uL (ref 0.7–4.0)
LYMPHS PCT: 10 %
MCH: 30.2 pg (ref 26.0–34.0)
MCHC: 35 g/dL (ref 30.0–36.0)
MCV: 86.2 fL (ref 78.0–100.0)
MONOS PCT: 8 %
Monocytes Absolute: 1.1 10*3/uL — ABNORMAL HIGH (ref 0.1–1.0)
Neutro Abs: 11 10*3/uL — ABNORMAL HIGH (ref 1.7–7.7)
Neutrophils Relative %: 81 %
Platelets: 453 10*3/uL — ABNORMAL HIGH (ref 150–400)
RBC: 2.98 MIL/uL — AB (ref 4.22–5.81)
RDW: 15.7 % — ABNORMAL HIGH (ref 11.5–15.5)
WBC: 13.7 10*3/uL — ABNORMAL HIGH (ref 4.0–10.5)

## 2016-05-18 MED ORDER — THIAMINE HCL 100 MG PO TABS: 100.0000 mg | ORAL_TABLET | Freq: Every day | ORAL | 0 refills | Status: AC

## 2016-05-18 MED ORDER — FOLIC ACID 1 MG PO TABS: 1.0000 mg | ORAL_TABLET | Freq: Every day | ORAL | 0 refills | Status: AC

## 2016-05-18 MED ORDER — HYDROCODONE-ACETAMINOPHEN 10-325 MG PO TABS: 1.0000 | ORAL_TABLET | ORAL | Status: DC | PRN

## 2016-05-18 MED ORDER — POLYETHYLENE GLYCOL 3350 17 G PO PACK: 17.0000 g | PACK | Freq: Every day | ORAL | 0 refills | Status: AC

## 2016-05-18 NOTE — Care Management Note (Addendum)
Case Management Note  Patient Details  Name: Reginald Ochoa MRN: [id number] Date of Birth: December 27, 1940  Subjective/Objective:                  blockage of the pancreatic stent Action/Plan: Discharge planning Expected Discharge Date:  05/18/16               Expected Discharge Plan:  Home w Hospice Care  In-House Referral:     Discharge planning Services  CM Consult  Post Acute Care Choice:    Choice offered to:  Patient, Spouse  DME Arranged:  Wheelchair manual DME Agency:     HH Arranged:    [place]:     Status of Service:  COMPLETE  If discussed at [employer], dates discussed:    Additional Comments: 15:56 CM called [facility] for transport home.  CM called [phone] ([facility]) to notify of discharge.  NO DC summary has been written as of yet but as [facility] has access to [facility]; they can retrieve once it's been written.   CM met with pt and pt's spouse in room and family states DME will be delivered by 15:30 today; wife to call this CM to confirm delivery and at this time, this CM will arrange for [facility] transport home.  Waiting on call from spouse Reginald Catholic, RN 05/18/2016, 12:15 PM

## 2016-05-18 NOTE — Progress Notes (Signed)
[facility] Liaison:  Checked in with patient, pt's stepson and Dr. Domingo Cocking this morning. [facility] to be delivering requested DME between 11a-3p today. Pt too weak to sit up in bed according to RN, cannot stand and pivot. Pt and family agree to transfer by ambulance to home. Inquired again about delivering hospital bed, however, pt and wife still decline need but do know that it can be requested any time. Pt desires to go home on current hospital regimen for pain management. Declines liquid medications at this time per Dr. Domingo Cocking.  Anticipate discharge today to home.  Completed d/c summary will need to be faxed to [facility] at [phone] when final.  Please notify [facility] when patient is ready to leave unit at discharge --call [phone] (or [phone] after 5pm).  Please feel free to call with any questions to [phone].  Thank you, Margaretmary Eddy, RN, BSN [place] are now on AMION. Please feel free to call me directly at the above number or [facility] at [phone].

## 2016-05-18 NOTE — Progress Notes (Signed)
Pt is discharged to home, for home hospice. Transported by Corey Harold, pt's wife present upon discharge.

## 2016-05-18 NOTE — Progress Notes (Signed)
GASTROENTEROLOGY PROGRESS NOTE  Problem:   Biliary obstruction due to pancreatic cancer  Subjective: Some mild pain, similar to that prior to yesterday's ERCP.  Objective: Afebrile, vitals unremarkable. Lying in bed, alert, grossly coherent, no evident distress, wife [name] at bedside. Abdomen soft, nontender. Still deeply jaundiced.  Labs show a mild elevation of white count over the past several days, mild improvement in both alkaline phosphatase and bilirubin overnight since the patient had his previous stent re-stented yesterday.  Assessment: Satisfactory post-ERCP course, with progressive deterioration related to tumor progression, but mild improvement in biliary obstruction accomplished by stent revision.  Plan: Okay for discharge from GI tract standpoint. Please call if we can be of further assistance in this patient's care.  Cleotis Nipper, M.D. 05/18/2016 1:09 PM  Pager [phone] If no answer or after 5 PM call [phone]

## 2016-05-20 ENCOUNTER — Encounter (HOSPITAL_COMMUNITY): Payer: Self-pay | Admitting: Gastroenterology

## 2016-05-20 LAB — CULTURE, BLOOD (ROUTINE X 2)
Culture: NO GROWTH
Culture: NO GROWTH

## 2016-05-21 ENCOUNTER — Encounter (HOSPITAL_COMMUNITY): Payer: Self-pay | Admitting: Gastroenterology

## 2016-05-21 LAB — CULTURE, BLOOD (SINGLE)

## 2016-05-21 LAB — URINE CULTURE

## 2016-05-22 ENCOUNTER — Encounter (HOSPITAL_COMMUNITY): Payer: Self-pay | Admitting: Gastroenterology

## 2016-05-22 NOTE — Anesthesia Postprocedure Evaluation (Signed)
Anesthesia Post Note  Patient: Reginald Ochoa  Procedure(s) Performed: Procedure(s) (LRB): ENDOSCOPIC RETROGRADE CHOLANGIOPANCREATOGRAPHY (ERCP) WITH PROPOFOL (N/A)  Patient location during evaluation: Endoscopy Anesthesia Type: General Level of consciousness: awake Pain management: pain level controlled Vital Signs Assessment: post-procedure vital signs reviewed and stable Respiratory status: spontaneous breathing, respiratory function stable and patient connected to nasal cannula oxygen Cardiovascular status: stable Postop Assessment: no signs of nausea or vomiting Anesthetic complications: no       Last Vitals:  Vitals:   05/18/16 0523 05/18/16 1446  BP: (!) 110/59 112/78  Pulse: 64 62  Resp: 16 16  Temp: 37.3 C 37.1 C    Last Pain:  Vitals:   05/18/16 1446  TempSrc: Oral  PainSc:                  Lathaniel Legate

## 2016-05-28 NOTE — Discharge Summary (Signed)
[facility] Discharge Summary   Patient: Reginald Ochoa [id number]   PCP: Elyn Peers, MD DOB: 07-30-1940   Date of admission: 05/15/2016   Date of discharge: 05/18/2016    Discharge Diagnoses:  Principal Problem:   Obstructive jaundice Active Problems:   Alcohol abuse   Pancreatic cancer metastasized to liver (HCC)   Fever   Unintentional weight loss   Severe protein-calorie malnutrition ([place])   DNR (do not resuscitate)   Cholangitis   Admitted From: home Disposition:  Home with hospice  Recommendations for Outpatient Follow-up:  1. Continue follow-up with hospice   Follow-up Information    [facility] at [facility] Follow up.   Specialty:  Hospice and Palliative Medicine Why:  your home hospice agency Contact information: [place] [address] [phone]          Diet recommendation: Soft diet  Activity: The patient is advised to gradually reintroduce usual activities.  Discharge Condition: fair  Code Status: DNR/DNI on comfort care and hospice  History of present illness: As per the H and P dictated on admission, "Reginald Ochoa is a 76 y.o. male with medical history significant of stage IV pancreatic cancer with last chemotherapy on 04/23/2016 not a candidate for chemotherapy due to deconditioning, most recent imaging revealed progression of disease, essential hypertension tobacco and alcohol abuse status post biliary stent in October 2017 the comes in to the [facility] [place] with complaints of progressive fatigue weakness and fever that started on the day prior to admission despite being on oral Levaquin, he continues to have weight loss, and has progressively gotten jaundice over the past few days with anorexia. Had a long discussion with Dr. Annamaria Boots who recommended hospice and the patient agreed. The patient will like to be treated with antibiotics and see of there is a possible fixable cause for his symptoms."  Hospital Course:    Summary of his active problems in the hospital is as following. 1. Obstructive jaundice Stage IV pancreatic cancer. Patient is S/P ERCP with biliary stent placement on 11/17. Admitted for possible cholangitis and started on IV antibiotics. He underwent ERCP on 05/17/2016, did have blockage of the stent as well as pus collection in the stent. GI would like to continue IV antibiotics for 1 more day. Labs were stable in the morning.  2. Goals of care discussion. Patient is stage IV pancreatic cancer with poor functional status. Not a candidate for chemotherapy. Definitely not a surgical candidate. Hospice recommended by oncology. Family agrees with the plan with plan to go home with hospice. Appreciate palliative care input.  3. Severe protein calorie malnutrition. In the setting of chronic malignancy as well as poor oral intake. Likely expected to further worsening. Monitor. Plan is to go home with hospice  All other chronic medical condition were stable during the hospitalization.  Home hospice was arranged by Education officer, [employer] and case [employer] forward[employer]. On the day of the discharge the patient's vitals were stable , and no other acute medical condition were reported by patient. the patient was felt safe to be discharge at home with hospice.  Procedures and Results:  ERCP   Consultations:  Gastroenterology  DISCHARGE MEDICATION: Discharge Medication List as of 05/18/2016  4:44 PM    START taking these medications   Details  folic acid (FOLVITE) 1 MG tablet Take 1 tablet (1 mg total) by mouth daily., Starting Sun 05/19/2016, Normal    thiamine 100 MG tablet Take 1 tablet (100 mg total) by mouth daily., Starting  Sun 05/19/2016, Normal      CONTINUE these medications which have CHANGED   Details  polyethylene glycol (MIRALAX / GLYCOLAX) packet Take 17 g by mouth daily., Starting Sat 05/18/2016, Print      CONTINUE these medications which have NOT CHANGED   Details  albuterol  (PROVENTIL HFA;VENTOLIN HFA) 108 (90 BASE) MCG/ACT inhaler Inhale 2 puffs into the lungs every 6 (six) hours as needed for wheezing or shortness of breath., Historical Med    ALPRAZolam (XANAX) 1 MG tablet Take 0.5-1 mg by mouth 2 (two) times daily as needed for anxiety or sleep (take 0.5mg  during the day if needed and 1mg  at night to help sleep). Anxiety , Historical Med    amLODipine (NORVASC) 10 MG tablet Take 10 mg by mouth daily., Historical Med    amphetamine-dextroamphetamine (ADDERALL) 10 MG tablet Take 5-10 mg by mouth 2 (two) times daily with a meal. Pt takes one tablet with breakfast and one-half tablet with lunch., Historical Med    budesonide-formoterol (SYMBICORT) 160-4.5 MCG/ACT inhaler Inhale 2 puffs into the lungs 2 (two) times daily as needed (shortness of breath/wheezing). , Historical Med    cetirizine (ZYRTEC) 10 MG tablet Take 10 mg by mouth at bedtime., Historical Med    cholecalciferol (VITAMIN D) 1000 units tablet Take 1,000 Units by mouth at bedtime. , Historical Med    dronabinol (MARINOL) 2.5 MG capsule Take 1 capsule (2.5 mg total) by mouth 2 (two) times daily before a meal., Starting Wed 05/08/2016, Print    FLUoxetine (PROZAC) 40 MG capsule Take 40 mg by mouth daily. , Historical Med    HYDROcodone-acetaminophen (NORCO) 10-325 MG tablet Take 1 tablet by mouth every 8 (eight) hours as needed for severe pain., Starting Sat 04/13/2016, Print    ibuprofen (ADVIL,MOTRIN) 200 MG tablet Take 200 mg by mouth every 6 (six) hours as needed for headache, mild pain or moderate pain. , Historical Med    LUTEIN PO Take 1 tablet by mouth daily., Historical Med    Multiple Vitamins-Minerals (MULTIVITAMIN WITH MINERALS) tablet Take 1 tablet by mouth daily. , Historical Med    ondansetron (ZOFRAN ODT) 8 MG disintegrating tablet Take 1 tablet (8 mg total) by mouth every 8 (eight) hours as needed for nausea or vomiting., Starting Fri 03/22/2016, Normal    oxymetazoline (AFRIN)  0.05 % nasal spray Place 1 spray into both nostrils 2 (two) times daily as needed for congestion., Historical Med    Pancrelipase, Lip-Prot-Amyl, (CREON) 24000-76000 units CPEP Take 1 capsule (24,000 Units total) by mouth 3 (three) times daily with meals., Starting Mon 04/29/2016, Normal    prochlorperazine (COMPAZINE) 10 MG tablet Take 10 mg by mouth every 6 (six) hours as needed for nausea or vomiting., Historical Med    senna-docusate (SENOKOT-S) 8.6-50 MG tablet Take 1 tablet by mouth at bedtime as needed for mild constipation., Starting Thu 03/07/2016, Normal    traMADol (ULTRAM) 50 MG tablet Take 1 tablet (50 mg total) by mouth every 12 (twelve) hours as needed for moderate pain or severe pain., Starting Wed 04/17/2016, Print      STOP taking these medications     aspirin EC 81 MG tablet      levofloxacin (LEVAQUIN) 500 MG tablet        No Known Allergies Discharge Instructions    Increase activity slowly    Complete by:  As directed      Discharge Exam: Filed Weights   05/16/16 0518 05/17/16 0500 05/18/16 0500  Weight: 53.8 kg (118 lb 9.7 oz) 50.8 kg (111 lb 15.9 oz) 52.4 kg (115 lb 8.3 oz)   Vitals:   05/18/16 0523 05/18/16 1446  BP: (!) 110/59 112/78  Pulse: 64 62  Resp: 16 16  Temp: 99.2 F (37.3 C) 98.8 F (37.1 C)   General: Appear in mild distress, no Rash; Oral Mucosa moist. Cardiovascular: S1 and S2 Present, no Murmur, no JVD Respiratory: Bilateral Air entry present and Clear to Auscultation, no Crackles, no wheezes Abdomen: Bowel Sound present, Soft and no tenderness Extremities: no Pedal edema, no calf tenderness Neurology: Grossly no focal neuro deficit.  The results of significant diagnostics from this hospitalization (including imaging, microbiology, ancillary and laboratory) are listed below for reference.    Significant Diagnostic Studies: Ct Abdomen Pelvis W Contrast  Result Date: 05/15/2016 CLINICAL DATA:  Stage IV pancreas cancer, status post  biliary stent in October. Progressive fatigue weakness and fever EXAM: CT ABDOMEN AND PELVIS WITH CONTRAST TECHNIQUE: Multidetector CT imaging of the abdomen and pelvis was performed using the standard protocol following bolus administration of intravenous contrast. CONTRAST:  87mL ISOVUE-300 IOPAMIDOL (ISOVUE-300) INJECTION 61% COMPARISON:  04/11/2016 FINDINGS: Lower chest: Lung bases demonstrate minimal fibrosis within the medial right lung base. No acute infiltrate or effusion is seen. The heart is not enlarged. Hepatobiliary: Innumerable hypodense hepatic metastatic lesions are visualized. Interval progression in number and size of multiple lesions. For example index lesion in the right hepatic dome, series 2, image number 9 measures 4.1 cm compared with 3.1 cm previously. A lateral segment left hepatic lobe lesion measures 2.9 cm compared with 1.8 cm previously. There is also interval increase in intra and extrahepatic biliary dilatation. Extrahepatic bile duct measures 2.6 cm at the proximal aspect of the stent. Stent is similar in position. Hypo to hyperdense material or tissue is present within the stent. Small amount of pneumobilia, likely related to the stent, but decreased compared to the prior study. Gallbladder is enlarged and demonstrates mild wall thickening or edema. Pancreas: Atrophic distal body and tail of pancreas with enlarged pancreatic duct. Ill-defined mass at the head and uncinate process measures 4.8 x 3.8 cm compared with 3.8 x 3.7 cm previously. Spleen: Normal in size without focal abnormality. Adrenals/Urinary Tract: Adrenal glands show no mass. Scattered cysts in the right kidney. No hydronephrosis. Thick-walled appearance of the posterior bladder as before. Stomach/Bowel: Stomach nonenlarged. No dilated small bowel. Large amount of stool in the colon. No colon wall thickening. Mild to moderate fecal impaction in the rectum. Vascular/Lymphatic: Atherosclerotic vascular calcifications.  The cystic nodule adjacent to the dominant pancreatic mass measures 1.6 cm, compared with 1.4 cm previously. Reproductive: Prostate gland is moderately enlarged. There is mass effect on the posterior bladder. Probable penile calcification as before. Other: No free air or free fluid. Musculoskeletal: No acute or suspicious bone lesions. IMPRESSION: 1. Innumerable hepatic metastatic lesions, increased in size and number compared to the previous exam, consistent with progression of metastatic disease. 2. Interval enlargement of the pancreatic head/uncinate process mass. 3. Interval worsening of intra and extra hepatic biliary dilatation. Minimal pneumobilia is present, this is decreased compared to the prior exam. Hypo and hyperdense material is present within the stent. Given increased biliary dilatation and decreased pneumobilia, findings could relate to extend obstruction. 4. Dilated gallbladder with mild wall thickening or edema 5. Progression of necrotic peripancreatic nodal metastasis. Electronically Signed   By: Donavan Foil M.D.   On: 05/15/2016 23:02   Dg Chest Portable 1 View  Result Date: 04/30/2016 CLINICAL DATA:  76 year old male with a history of weakness and tachycardia EXAM: PORTABLE CHEST 1 VIEW COMPARISON:  04/19/2016, 03/05/2016 FINDINGS: Cardiomediastinal silhouette unchanged. No pleural effusion, pneumothorax, or confluent airspace disease. Nipple shadow over the right lower chest. No displaced fracture. IMPRESSION: No radiographic evidence of acute cardiopulmonary disease. Signed, Dulcy Fanny. Earleen Newport, DO [facility] Radiology Electronically Signed   By: Corrie Mckusick D.O.   On: 04/30/2016 18:15   Dg Ercp  Result Date: 05/17/2016 CLINICAL DATA:  76 year old male with pancreatic cancer obstructing biliary system. Evidence on recent CT of duct occlusion/stent occlusion EXAM: ERCP TECHNIQUE: Multiple spot images obtained with the fluoroscopic  device and submitted for interpretation post-procedure. FLUOROSCOPY TIME:  6 minutes 58 seconds COMPARISON:  CT 05/15/2016 FINDINGS: Limited intraoperative fluoroscopic spot images of ERCP. Initial images demonstrate endoscope projecting over the upper abdomen with a existing metallic stent in place. Subsequent there is cannulation of the ampulla and guidewire retrograde within the extrahepatic biliary system. Injection of contrast demonstrates stent obstruction with dilated extrahepatic bile ducts. Final images demonstrate re- lining of the previous stent with a new metallic stent. IMPRESSION: Limited images during ERCP demonstrating re- lining of the previously placed metallic stent with a new metallic common bile duct stent for treatment of obstructed biliary system. Please refer to the dictated operative report for full details of intraoperative findings and procedure. Signed, Dulcy Fanny. Earleen Newport DO [facility] Radiology These images were submitted for radiologic interpretation only. Please see the procedural report for the amount of contrast and the fluoroscopy time utilized. Electronically Signed   By: Corrie Mckusick D.O.   On: 05/17/2016 12:13   Time spent: 30 minutes  Signed:  Berle Mull  [facility] 05/18/2016 , 5:34 PM

## 2016-06-03 ENCOUNTER — Other Ambulatory Visit: Payer: Self-pay | Admitting: Hematology

## 2016-06-03 MED FILL — ONDANSETRON ODT 8 MG TABLET: 8 | 10 days supply | Qty: 30 | Fill #0

## 2016-06-04 MED FILL — DRONABINOL 2.5 MG CAPSULE: 2.5 | 15 days supply | Qty: 30 | Fill #0

## 2016-06-24 MED FILL — CREON DR 24,000 UNITS CAP: 24000-76000 | 30 days supply | Qty: 90 | Fill #0

## 2016-07-11 ENCOUNTER — Telehealth: Payer: Self-pay

## 2016-07-11 NOTE — Telephone Encounter (Signed)
[name] with [facility] called to report pt death August 01, 2016 at 916 pm at home.

## 2016-07-11 DEATH — deceased

## 2016-08-26 ENCOUNTER — Other Ambulatory Visit: Payer: Self-pay | Admitting: Nurse Practitioner

## 2018-11-30 IMAGING — CT CT ABD-PELV W/ CM
2 of 10 series · 13 of 46 positions shown, 15 images · IV contrast (iopamidol)
Comparison: 02/28/2016 all

CLINICAL DATA: Recently diagnosed pancreatic cancer. Chemotherapy
started. Fever. Increased right upper quadrant pain. Biliary stent
placed [DATE].

EXAM:
CT ABDOMEN AND PELVIS WITH CONTRAST
TECHNIQUE: Multidetector CT imaging of the abdomen and pelvis was performed
using the standard protocol following bolus administration of
intravenous contrast.
CONTRAST:  100mL DRGZEW-JDD IOPAMIDOL (DRGZEW-JDD) INJECTION 61%

[Series 5: coronal arterial · coronal · arterial · 0.40mm/px · 3 of 90 slices shown]
[im 23/90  soft-tissue]
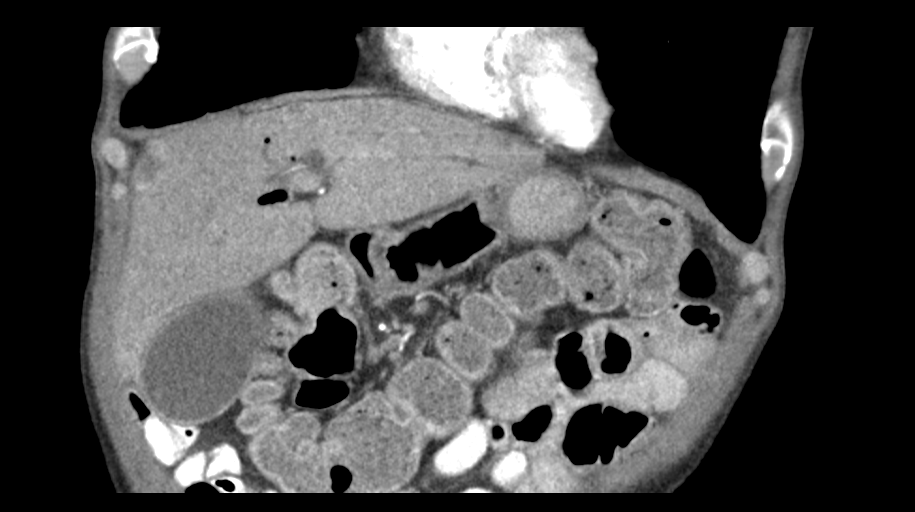
[im 45/90  soft-tissue]
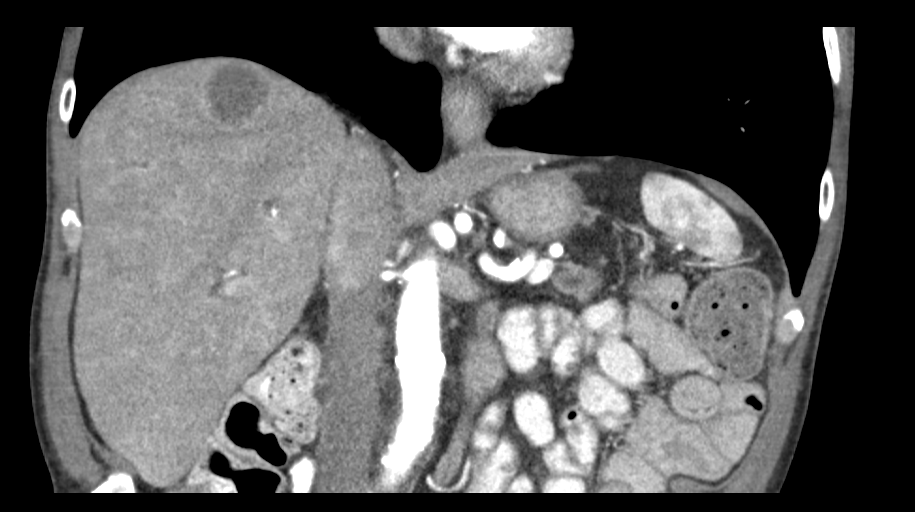
[im 67/90  soft-tissue]
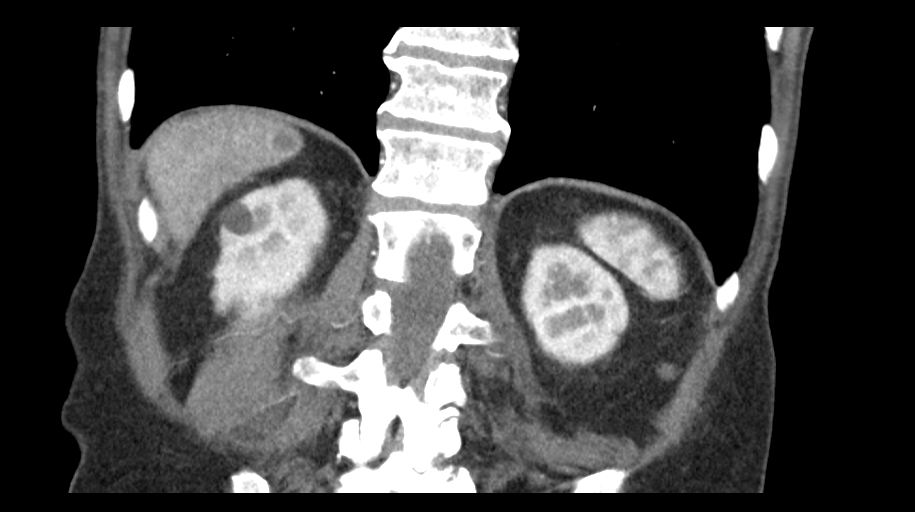

[Series 9: portal thin · axial · portal-venous · 0.71mm/px · z∈[+1066,+1386]mm · 10 of 194 slices shown, 12 images]
[im 17/194  soft-tissue]
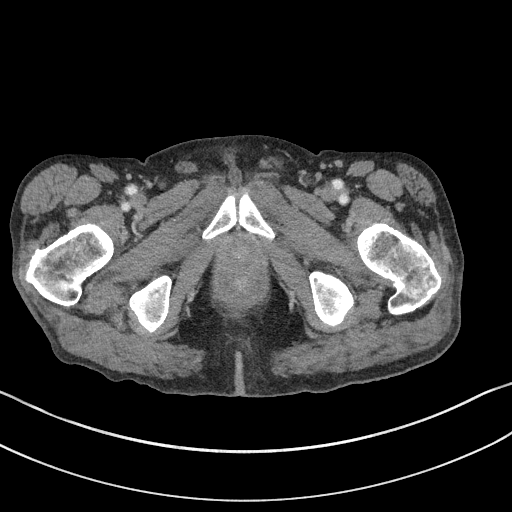
[im 17/194  bone]
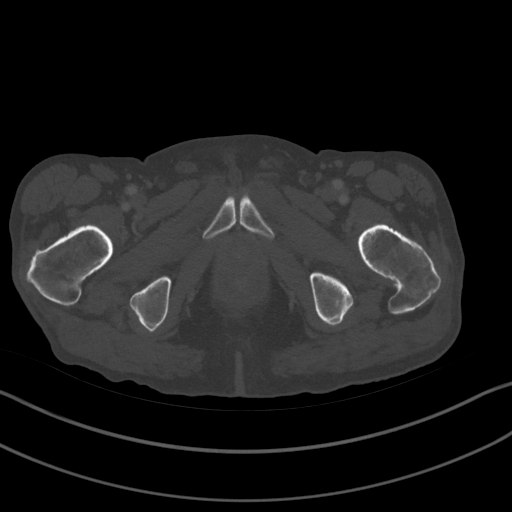
[im 33/194  soft-tissue]
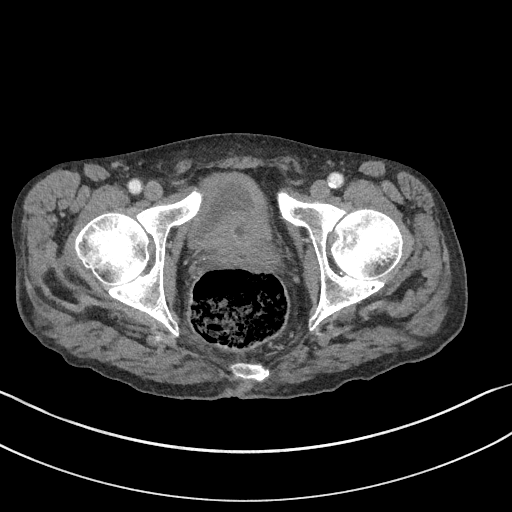
[im 49/194  soft-tissue]
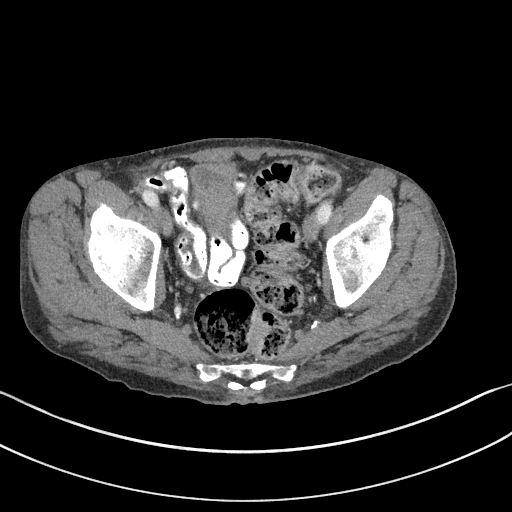
[im 65/194  soft-tissue]
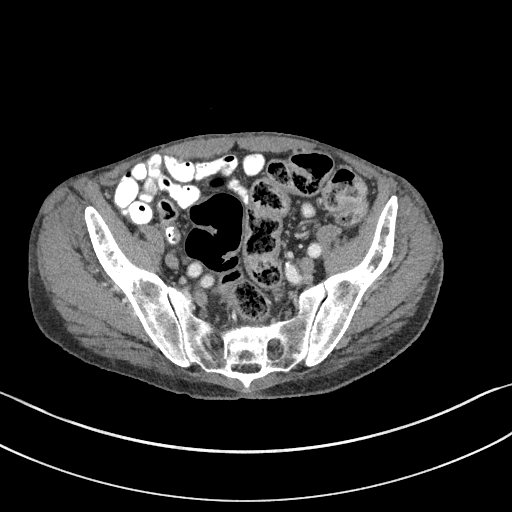
[im 81/194  soft-tissue]
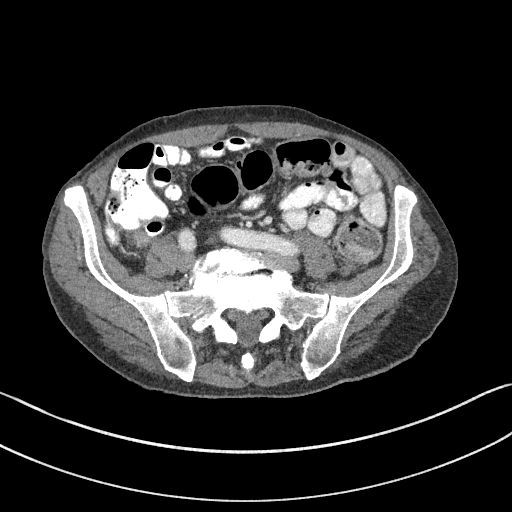
[im 113/194  soft-tissue]
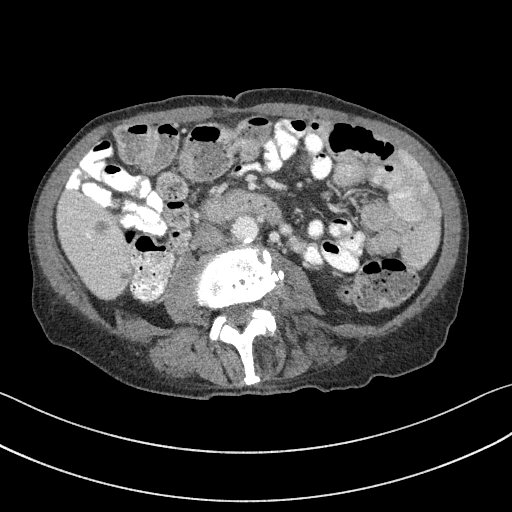
[im 129/194  soft-tissue]
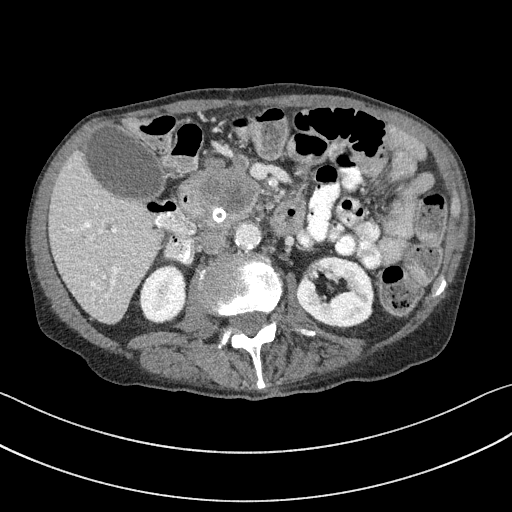
[im 145/194  soft-tissue]
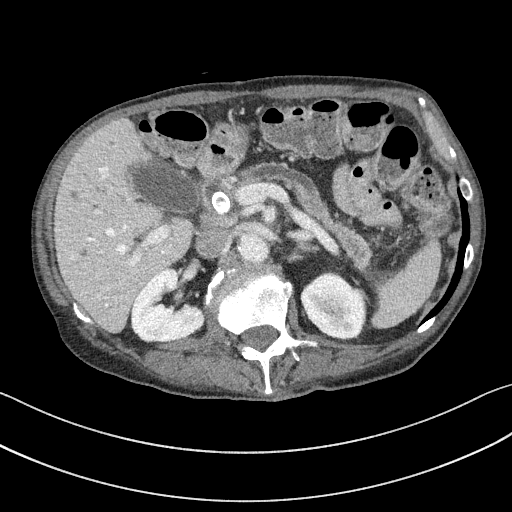
[im 161/194  soft-tissue]
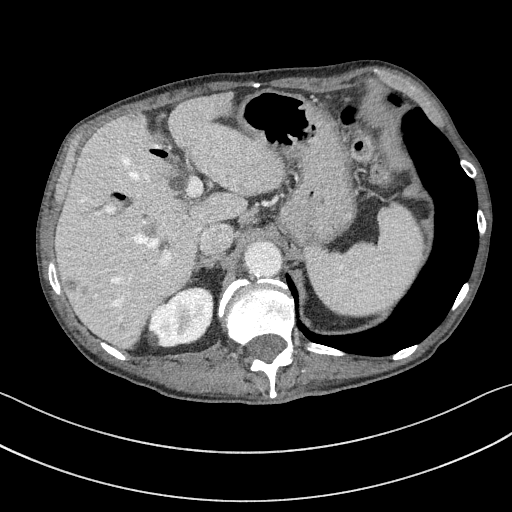
[im 161/194  bone]
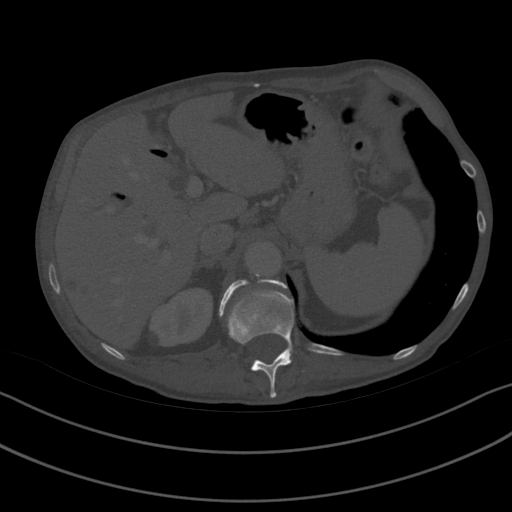
[im 177/194  soft-tissue]
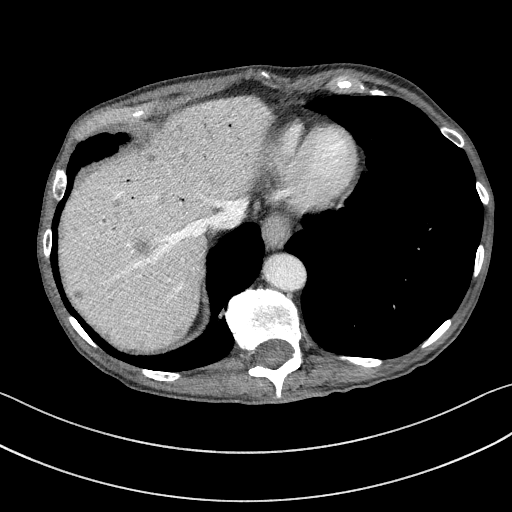

[13 of 46 positions shown; findings below may reference images not displayed]

FINDINGS: Lower chest: Clear lung bases. Normal heart size without pericardial
or pleural effusion.

Hepatobiliary: Hepatic metastasis. Index hepatic dome lesion
measures 3.1 cm in image 7/ series 7 versus 1.9 cm on the prior.

An index lesion within the anterior right and medial left hepatic
lobes measures 2.2 cm on image 19/series 7 versus 1.3 cm on the
prior exam (when remeasured). Other lesions are new and enlarged.

Borderline gallbladder distension is unchanged. Probable sludge in
the gallbladder. No specific evidence of acute cholecystitis.

Pneumobilia. Mild intrahepatic biliary duct dilatation, increased
since the prior CT. Common duct stent originates at the level porta
hepatis and terminates within been descending duodenum. The common
duct measures maximally 1.6 cm on image 27/series 7 just above the
stent. High-density material within the stent including on image
36/series 7.

Pancreas: Pancreatic body/ tail atrophy with duct dilatation. This
continues to the level of a pancreatic head/ uncinate process mass
which measures 3.7 x 3.8 cm on image 45/series 7. Compare 3.5 x
cm on the prior. No acute pancreatitis.

Spleen: Old granulomatous disease in the spleen.

Adrenals/Urinary Tract: Normal adrenal glands. Right renal cysts.
Normal left kidney, without hydronephrosis. Mild bladder wall
thickening.

Stomach/Bowel: Normal colon and terminal ileum.  Normal small bowel.

Vascular/Lymphatic: Aortic and branch vessel atherosclerosis. Patent
portal and hepatic veins. 10 mm portal caval node is not pathologic
by size criteria but is enlarged from 6 mm on the prior. Necrotic
peripancreatic adenopathy including at 1.4 cm anterior to the
pancreatic head on image 46/series 7. Enlarged from 7 mm previously.
No pelvic sidewall adenopathy.

Reproductive: Moderate prostatomegaly. Probable dystrophic
calcification within the penis.

Other: No significant free fluid. No evidence of omental or
peritoneal disease.

Musculoskeletal: Lumbosacral spondylosis with convex right lumbar
spine curvature.
IMPRESSION: 1. Mild enlargement of pancreatic head/uncinate process mass and
progression of hepatic metastasis.
2. Interval placement of a common duct stent. Since 02/28/2016,
progression of biliary duct dilatation. Pneumobilia, arguing against
complete stent obstruction. Hyperattenuation within the stent could
be artifactual. Cannot exclude stones within.
3. Persistent borderline gallbladder distention without specific
evidence of acute cholecystitis.
4. Progressive peripancreatic nodal metastasis.
5. Prostatomegaly with bladder wall thickening, likely secondary and
related to a component of outlet obstruction.
